# Patient Record
Sex: Male | Born: 1950 | Race: White | Hispanic: No | Smoking: Current every day smoker
Health system: Southern US, Community
[De-identification: ages and names within clinical notes are randomized; demographics above are authoritative.]

## PROBLEM LIST (undated history)

## (undated) ENCOUNTER — Emergency Department (HOSPITAL_COMMUNITY): Admission: EM | Payer: Medicare HMO

## (undated) DIAGNOSIS — I10 Essential (primary) hypertension: Secondary | ICD-10-CM

## (undated) DIAGNOSIS — F319 Bipolar disorder, unspecified: Secondary | ICD-10-CM

## (undated) DIAGNOSIS — Z720 Tobacco use: Secondary | ICD-10-CM

## (undated) DIAGNOSIS — I4891 Unspecified atrial fibrillation: Secondary | ICD-10-CM

## (undated) DIAGNOSIS — E785 Hyperlipidemia, unspecified: Secondary | ICD-10-CM

## (undated) DIAGNOSIS — F988 Other specified behavioral and emotional disorders with onset usually occurring in childhood and adolescence: Secondary | ICD-10-CM

## (undated) DIAGNOSIS — R7303 Prediabetes: Secondary | ICD-10-CM

## (undated) DIAGNOSIS — I714 Abdominal aortic aneurysm, without rupture, unspecified: Secondary | ICD-10-CM

## (undated) DIAGNOSIS — F209 Schizophrenia, unspecified: Secondary | ICD-10-CM

## (undated) DIAGNOSIS — J449 Chronic obstructive pulmonary disease, unspecified: Secondary | ICD-10-CM

## (undated) HISTORY — PX: OTHER SURGICAL HISTORY: SHX169

## (undated) HISTORY — DX: Tobacco use: Z72.0

## (undated) HISTORY — DX: Hyperlipidemia, unspecified: E78.5

## (undated) HISTORY — DX: Abdominal aortic aneurysm, without rupture, unspecified: I71.40

## (undated) HISTORY — DX: Bipolar disorder, unspecified: F31.9

## (undated) HISTORY — DX: Abdominal aortic aneurysm, without rupture: I71.4

## (undated) HISTORY — DX: Schizophrenia, unspecified: F20.9

## (undated) HISTORY — DX: Essential (primary) hypertension: I10

## (undated) HISTORY — PX: ABDOMINAL AORTIC ANEURYSM REPAIR: SUR1152

## (undated) HISTORY — PX: KNEE SURGERY: SHX244

## (undated) HISTORY — DX: Prediabetes: R73.03

## (undated) HISTORY — DX: Other specified behavioral and emotional disorders with onset usually occurring in childhood and adolescence: F98.8

## (undated) HISTORY — DX: Unspecified atrial fibrillation: I48.91

---

## 1998-04-30 ENCOUNTER — Emergency Department (HOSPITAL_COMMUNITY): Admission: EM | Admit: 1998-04-30 | Discharge: 1998-04-30 | Payer: Self-pay

## 1999-05-26 ENCOUNTER — Encounter: Admission: RE | Admit: 1999-05-26 | Discharge: 1999-05-31 | Payer: Self-pay | Admitting: *Deleted

## 2000-03-11 ENCOUNTER — Emergency Department (HOSPITAL_COMMUNITY): Admission: EM | Admit: 2000-03-11 | Discharge: 2000-03-11 | Payer: Self-pay | Admitting: Emergency Medicine

## 2000-04-10 ENCOUNTER — Encounter: Payer: Self-pay | Admitting: Emergency Medicine

## 2000-04-10 ENCOUNTER — Encounter: Admission: RE | Admit: 2000-04-10 | Discharge: 2000-04-10 | Payer: Self-pay | Admitting: Emergency Medicine

## 2006-01-25 ENCOUNTER — Ambulatory Visit: Payer: Self-pay | Admitting: Internal Medicine

## 2006-06-15 ENCOUNTER — Ambulatory Visit: Payer: Self-pay | Admitting: Internal Medicine

## 2006-06-15 DIAGNOSIS — E785 Hyperlipidemia, unspecified: Secondary | ICD-10-CM | POA: Insufficient documentation

## 2006-08-01 ENCOUNTER — Ambulatory Visit (HOSPITAL_BASED_OUTPATIENT_CLINIC_OR_DEPARTMENT_OTHER): Admission: RE | Admit: 2006-08-01 | Discharge: 2006-08-01 | Payer: Self-pay | Admitting: Internal Medicine

## 2006-08-01 ENCOUNTER — Encounter: Payer: Self-pay | Admitting: Internal Medicine

## 2006-08-23 ENCOUNTER — Ambulatory Visit: Payer: Self-pay | Admitting: Pulmonary Disease

## 2007-02-09 ENCOUNTER — Emergency Department (HOSPITAL_COMMUNITY): Admission: EM | Admit: 2007-02-09 | Discharge: 2007-02-10 | Payer: Self-pay | Admitting: Emergency Medicine

## 2007-02-27 ENCOUNTER — Ambulatory Visit: Payer: Self-pay | Admitting: Internal Medicine

## 2007-05-13 DIAGNOSIS — I10 Essential (primary) hypertension: Secondary | ICD-10-CM | POA: Insufficient documentation

## 2007-05-13 DIAGNOSIS — F988 Other specified behavioral and emotional disorders with onset usually occurring in childhood and adolescence: Secondary | ICD-10-CM

## 2007-05-13 HISTORY — DX: Other specified behavioral and emotional disorders with onset usually occurring in childhood and adolescence: F98.8

## 2008-04-06 ENCOUNTER — Telehealth: Payer: Self-pay | Admitting: Internal Medicine

## 2008-04-09 ENCOUNTER — Ambulatory Visit: Payer: Self-pay | Admitting: Internal Medicine

## 2008-04-14 ENCOUNTER — Ambulatory Visit: Payer: Self-pay | Admitting: Gastroenterology

## 2008-04-14 ENCOUNTER — Telehealth: Payer: Self-pay | Admitting: Internal Medicine

## 2008-04-22 ENCOUNTER — Telehealth: Payer: Self-pay | Admitting: Internal Medicine

## 2008-04-22 LAB — CONVERTED CEMR LAB
ALT: 21 units/L (ref 0–53)
AST: 19 units/L (ref 0–37)
Albumin: 4 g/dL (ref 3.5–5.2)
Alkaline Phosphatase: 61 units/L (ref 39–117)
BUN: 10 mg/dL (ref 6–23)
Bilirubin, Direct: 0.1 mg/dL (ref 0.0–0.3)
CO2: 31 meq/L (ref 19–32)
Calcium: 8.9 mg/dL (ref 8.4–10.5)
Chloride: 103 meq/L (ref 96–112)
Cholesterol: 210 mg/dL (ref 0–200)
Creatinine, Ser: 1.2 mg/dL (ref 0.4–1.5)
Direct LDL: 165.4 mg/dL
GFR calc Af Amer: 81 mL/min
GFR calc non Af Amer: 67 mL/min
Glucose, Bld: 93 mg/dL (ref 70–99)
HDL: 30.7 mg/dL — ABNORMAL LOW (ref >39.0)
Potassium: 4 meq/L (ref 3.5–5.1)
Sodium: 140 meq/L (ref 135–145)
Total Bilirubin: 1 mg/dL (ref 0.3–1.2)
Total CHOL/HDL Ratio: 6.8
Total Protein: 6.7 g/dL (ref 6.0–8.3)
Triglycerides: 114 mg/dL (ref 0–149)
VLDL: 23 mg/dL (ref 0–40)

## 2008-05-27 ENCOUNTER — Telehealth: Payer: Self-pay | Admitting: Internal Medicine

## 2008-07-14 ENCOUNTER — Ambulatory Visit: Payer: Self-pay | Admitting: Internal Medicine

## 2008-07-14 DIAGNOSIS — F17200 Nicotine dependence, unspecified, uncomplicated: Secondary | ICD-10-CM | POA: Insufficient documentation

## 2008-07-14 HISTORY — DX: Nicotine dependence, unspecified, uncomplicated: F17.200

## 2008-07-17 LAB — CONVERTED CEMR LAB
ALT: 21 units/L (ref 0–53)
AST: 19 units/L (ref 0–37)
Albumin: 4 g/dL (ref 3.5–5.2)
Alkaline Phosphatase: 71 units/L (ref 39–117)
BUN: 10 mg/dL (ref 6–23)
Bilirubin, Direct: 0.1 mg/dL (ref 0.0–0.3)
CO2: 34 meq/L — ABNORMAL HIGH (ref 19–32)
Calcium: 9.7 mg/dL (ref 8.4–10.5)
Chloride: 99 meq/L (ref 96–112)
Cholesterol: 170 mg/dL (ref 0–200)
Creatinine, Ser: 1 mg/dL (ref 0.4–1.5)
GFR calc Af Amer: 99 mL/min
GFR calc non Af Amer: 82 mL/min
Glucose, Bld: 90 mg/dL (ref 70–99)
HDL: 28 mg/dL — ABNORMAL LOW (ref >39.0)
LDL Cholesterol: 114 mg/dL — ABNORMAL HIGH (ref 0–99)
Potassium: 4.4 meq/L (ref 3.5–5.1)
Sodium: 142 meq/L (ref 135–145)
Total Bilirubin: 0.8 mg/dL (ref 0.3–1.2)
Total CHOL/HDL Ratio: 6.1
Total Protein: 7.2 g/dL (ref 6.0–8.3)
Triglycerides: 139 mg/dL (ref 0–149)
VLDL: 28 mg/dL (ref 0–40)

## 2008-07-20 ENCOUNTER — Telehealth: Payer: Self-pay | Admitting: Internal Medicine

## 2008-08-07 ENCOUNTER — Telehealth: Payer: Self-pay | Admitting: Internal Medicine

## 2008-08-11 ENCOUNTER — Ambulatory Visit: Payer: Self-pay | Admitting: Internal Medicine

## 2008-11-25 ENCOUNTER — Telehealth: Payer: Self-pay | Admitting: Internal Medicine

## 2009-03-09 ENCOUNTER — Ambulatory Visit: Payer: Self-pay | Admitting: Internal Medicine

## 2009-11-23 ENCOUNTER — Encounter: Payer: Self-pay | Admitting: Internal Medicine

## 2009-11-23 ENCOUNTER — Telehealth: Payer: Self-pay | Admitting: Internal Medicine

## 2009-11-26 ENCOUNTER — Encounter: Payer: Self-pay | Admitting: Internal Medicine

## 2010-01-14 ENCOUNTER — Telehealth: Payer: Self-pay | Admitting: Internal Medicine

## 2010-04-12 ENCOUNTER — Telehealth: Payer: Self-pay | Admitting: Internal Medicine

## 2010-06-07 ENCOUNTER — Ambulatory Visit: Payer: Self-pay | Admitting: Internal Medicine

## 2010-06-08 LAB — CONVERTED CEMR LAB
ALT: 26 units/L (ref 0–53)
AST: 22 units/L (ref 0–37)
Albumin: 4 g/dL (ref 3.5–5.2)
Alkaline Phosphatase: 64 units/L (ref 39–117)
BUN: 9 mg/dL (ref 6–23)
Bilirubin, Direct: 0.1 mg/dL (ref 0.0–0.3)
CO2: 29 meq/L (ref 19–32)
Calcium: 9 mg/dL (ref 8.4–10.5)
Chloride: 106 meq/L (ref 96–112)
Cholesterol: 246 mg/dL — ABNORMAL HIGH (ref 0–200)
Creatinine, Ser: 1.1 mg/dL (ref 0.4–1.5)
Direct LDL: 193.2 mg/dL
GFR calc non Af Amer: 76.17 mL/min (ref >60)
Glucose, Bld: 89 mg/dL (ref 70–99)
HDL: 39.2 mg/dL (ref >39.00)
Potassium: 4.3 meq/L (ref 3.5–5.1)
Sodium: 141 meq/L (ref 135–145)
TSH: 0.63 microintl units/mL (ref 0.35–5.50)
Total Bilirubin: 0.6 mg/dL (ref 0.3–1.2)
Total CHOL/HDL Ratio: 6
Total Protein: 7 g/dL (ref 6.0–8.3)
Triglycerides: 162 mg/dL — ABNORMAL HIGH (ref 0.0–149.0)
VLDL: 32.4 mg/dL (ref 0.0–40.0)

## 2010-07-22 ENCOUNTER — Ambulatory Visit: Payer: Self-pay | Admitting: Internal Medicine

## 2010-08-25 ENCOUNTER — Encounter (INDEPENDENT_AMBULATORY_CARE_PROVIDER_SITE_OTHER): Payer: Self-pay | Admitting: *Deleted

## 2010-09-15 ENCOUNTER — Telehealth: Payer: Self-pay | Admitting: Internal Medicine

## 2010-10-06 NOTE — Progress Notes (Signed)
Summary: request  Phone Note Refill Request Call back at [phone] Message from:  Patient  Refills Requested: Medication #1:  CONCERTA 36 MG TBCR Take 1 tablet by mouth once a day please call pt when ready for pick up  Initial call taken by: Heron Sabins,  April 12, 2010 9:42 AM  Follow-up for Phone Call        Last seen 03/09/09.  Told needs ov for Rx.  States does not have job or insurance so cannot afford to come in.   How to handle? Follow-up by: Gladis Riffle, RN,  April 12, 2010 2:45 PM

## 2010-10-06 NOTE — Progress Notes (Signed)
Summary: new rx  Phone Note From Pharmacy Call back at [facility] Phone [phone]   Summary of Call: pt is requesting a sample of concerta.  patient no longer has insurance and cannont afford the medication.  he was wondering if there is anything cheaper he could try. I did explain that we do not have samples of concerta Initial call taken by: Kern Reap CMA Duncan Dull),  November 23, 2009 2:03 PM  Follow-up for Phone Call        patient brough in paperwork for patient assistance to be filled out and faxed. Follow-up by: Kern Reap CMA Duncan Dull),  November 24, 2009 11:30 AM  Additional Follow-up for Phone Call Additional follow up Details #1::        faxed and scanned to chart Additional Follow-up by: Kern Reap CMA Duncan Dull),  November 26, 2009 2:03 PM

## 2010-10-06 NOTE — Medication Information (Signed)
Summary: Patient Assistance Program for Concerta  Patient Assistance Program for Concerta   Imported By: Maryln Gottron 12/07/2009 10:31:43  _____________________________________________________________________  External Attachment:    Type:   Image     Comment:   External Document

## 2010-10-06 NOTE — Assessment & Plan Note (Signed)
Summary: MED CK / REFILL // RS   Vital Signs:  Patient profile:   60 year old male Weight:      236 pounds Temp:     98.6 degrees F oral Pulse rate:   72 / minute Pulse rhythm:   regular Resp:     12 per minute BP sitting:   180 / 110  (left arm) Cuff size:   large  Vitals Entered By: Sid Falcon LPN (June 07, 2010 9:43 AM)  History of Present Illness:  Follow-Up Visit      This is a 60 year old man who presents for Follow-up visit.  The patient denies chest pain and palpitations.  Since the last visit the patient notes no new problems or concerns except has right elbow pain---increase with movement---pain can extend to right shoulder. Feels like it is "sunburned".  The patient reports not taking meds as prescribed.  When questioned about possible medication side effects, the patient notes none.    All other systems reviewed and were negative   Current Problems (verified): 1)  Tobacco Use  (ICD-305.1) 2)  Add  (ICD-314.00) 3)  Hypertension  (ICD-401.9) 4)  Hyperlipidemia  (ICD-272.4)  Allergies: 1)  Codeine Phosphate (Codeine Phosphate)  Past History:  Past Medical History: Last updated: 04/09/2008 ADD Hyperlipidemia Hypertension Tobacco Abuse  Past Surgical History: Last updated: 04/09/2008  knee surgery, bilateral  open  Family History: Last updated: 04/09/2008 Father: A & W Mother: A & W Siblings: 1 brother HTN                1 sister with ? health Family History Hypertension  Social History: Last updated: 04/09/2008 Occupation:sell plumbing supplies Divorced Current Smoker 1 healthy child age 89 on 01/25/06  Risk Factors: Smoking Status: current (04/09/2008) Packs/Day: >1 (05/13/2007) Passive Smoke Exposure: yes (07/14/2008)  Physical Exam  General:  alert and well-developed.   Head:  normocephalic and atraumatic.   Eyes:  pupils equal and pupils round.   Ears:  R ear normal and L ear normal.   Neck:  No deformities, masses, or tenderness  noted. Chest Wall:  No deformities, masses, tenderness or gynecomastia noted. Heart:  normal rate and regular rhythm.   Abdomen:  soft and non-tender.   Skin:  turgor normal and color normal.   Psych:  good eye contact.     Impression & Recommendations:  Problem # 1:  TOBACCO USE (ICD-305.1) advise complete tobacco absence. Patient is not interested in quitting.  Problem # 2:  ADD (ICD-314.00) discussed medications. Will resume previous medications. Side effects discussed.  Problem # 3:  HYPERTENSION (ICD-401.9)  pt has not been compliant with meds resume meds he will monitor blood pressure. He will call me if blood pressure is over 140/90. His updated medication list for this problem includes:    Lisinopril-hydrochlorothiazide 20-25 Mg Tabs (Lisinopril-hydrochlorothiazide) .Marland Kitchen... Take 1 tablet by mouth once a day  BP today: 180/110 Prior BP: 132/86 (03/09/2009)  Labs Reviewed: K+: 4.4 (07/14/2008) Creat: : 1.0 (07/14/2008)   Chol: 170 (07/14/2008)   HDL: 28.0 (07/14/2008)   LDL: 114 (07/14/2008)   TG: 139 (07/14/2008)  Orders: Venipuncture (44034) Specimen Handling (74259) TLB-BMP (Basic Metabolic Panel-BMET) (80048-METABOL)  Problem # 4:  HYPERLIPIDEMIA (ICD-272.4)  has been noncompliant  advised to resume meds check labs today His updated medication list for this problem includes:    Simvastatin 20 Mg Tabs (Simvastatin) ..... One daily  Orders: Specimen Handling (56387) TLB-Lipid Panel (80061-LIPID) TLB-Hepatic/Liver Function Pnl (  80076-HEPATIC) TLB-TSH (Thyroid Stimulating Hormone) (84443-TSH)  Problem # 5:  LATERAL EPICONDYLITIS OF ELBOW (ICD-726.32) ice to area consider NSAID  Complete Medication List: 1)  Concerta 36 Mg Tbcr (Methylphenidate hcl) .... Take 1 tablet by mouth once a day 2)  Lisinopril-hydrochlorothiazide 20-25 Mg Tabs (Lisinopril-hydrochlorothiazide) .... Take 1 tablet by mouth once a day 3)  Simvastatin 20 Mg Tabs (Simvastatin) .... One  daily 4)  Concerta 36 Mg Cr-tabs (Methylphenidate hcl) .... Take 1 tablet by mouth once a day   fill in one month 5)  Concerta 36 Mg Cr-tabs (Methylphenidate hcl) .... Take 1 tablet by mouth once a day   fill in two months  Other Orders: Admin 1st Vaccine (04540) Flu Vaccine 23yrs + (98119) Gastroenterology Referral (GI) Prescriptions: CONCERTA 36 MG CR-TABS (METHYLPHENIDATE HCL) Take 1 tablet by mouth once a day   FILL IN TWO MONTHS  #30 x 0   Entered and Authorized by:   Birdie Sons MD   Signed by:   Birdie Sons MD on 06/07/2010   Method used:   Print then Give to Patient   RxID:   [id number] CONCERTA 36 MG CR-TABS (METHYLPHENIDATE HCL) Take 1 tablet by mouth once a day   FILL IN ONE MONTH  #30 x 0   Entered and Authorized by:   Birdie Sons MD   Signed by:   Birdie Sons MD on 06/07/2010   Method used:   Print then Give to Patient   RxID:   [id number] SIMVASTATIN 20 MG  TABS (SIMVASTATIN) one daily  #90 x 3   Entered and Authorized by:   Birdie Sons MD   Signed by:   Birdie Sons MD on 06/07/2010   Method used:   Print then Give to Patient   RxID:   [id number] LISINOPRIL-HYDROCHLOROTHIAZIDE 20-25 MG  TABS (LISINOPRIL-HYDROCHLOROTHIAZIDE) Take 1 tablet by mouth once a day  #90 x 3   Entered and Authorized by:   Birdie Sons MD   Signed by:   Birdie Sons MD on 06/07/2010   Method used:   Print then Give to Patient   RxID:   [id number] CONCERTA 36 MG TBCR (METHYLPHENIDATE HCL) Take 1 tablet by mouth once a day  #30 x 0   Entered and Authorized by:   Birdie Sons MD   Signed by:   Birdie Sons MD on 06/07/2010   Method used:   Print then Give to Patient   RxID:   [id number]    Flu Vaccine Consent Questions     Do you have a history of severe allergic reactions to this vaccine? no    Any prior history of allergic reactions to egg and/or gelatin? no    Do you have a sensitivity to the preservative Thimersol? no    Do you have a past history of  Guillan-Barre Syndrome? no    Do you currently have an acute febrile illness? no    Have you ever had a severe reaction to latex? no    Vaccine information given and explained to patient? yes    Are you currently pregnant? no    Lot Number:[id number]   Exp Date:03/04/2011   Site Given  Left Deltoid IMbflu

## 2010-10-06 NOTE — Progress Notes (Signed)
Summary: Pt req refills of Concerta,Lisinopril and Simvastatin  Phone Note Refill Request Call back at [phone] cell   Refills Requested: Medication #1:  CONCERTA 36 MG TBCR Take 1 tablet by mouth once a day   Dosage confirmed as above?Dosage Confirmed   Supply Requested: 3 months  Medication #2:  LISINOPRIL-HYDROCHLOROTHIAZIDE 20-25 MG  TABS Take 1 tablet by mouth once a day  Medication #3:  SIMVASTATIN 20 MG  TABS one daily   Supply Requested: 3 months   Supply Requested: 3 months  Method Requested: Telephone to [facility] Initial call taken by: Lucy Antigua,  September 15, 2010 1:12 PM  Follow-up for Phone Call        concerta rx up front ready for p/u, other 2 sent in to pharmacy electronically, pt aware Follow-up by: Alfred Levins, CMA,  September 16, 2010 8:47 AM    Prescriptions: CONCERTA 36 MG TBCR (METHYLPHENIDATE HCL) Take 1 tablet by mouth once a day  #30 x 0   Entered by:   Alfred Levins, CMA   Authorized by:   Birdie Sons MD   Signed by:   Alfred Levins, CMA on 09/15/2010   Method used:   Print then Give to Patient   RxID:   [id number] SIMVASTATIN 20 MG  TABS (SIMVASTATIN) one daily  #90 x 3   Entered by:   Alfred Levins, CMA   Authorized by:   Birdie Sons MD   Signed by:   Alfred Levins, CMA on 09/15/2010   Method used:   Electronically to        [facility]  [phone]* (retail)       [address]       [place], [place]  [address]       Ph: [phone] or [phone]       Fax: [phone]   RxID:   [id number] LISINOPRIL-HYDROCHLOROTHIAZIDE 20-25 MG  TABS (LISINOPRIL-HYDROCHLOROTHIAZIDE) Take 1 tablet by mouth once a day  #90 x 3   Entered by:   Alfred Levins, CMA   Authorized by:   Birdie Sons MD   Signed by:   Alfred Levins, CMA on 09/15/2010   Method used:   Electronically to        [facility]  [phone]* (retail)       [address]       [place],  [place]  [address]       Ph: [phone] or [phone]       Fax: [phone]   RxID:   [id number]

## 2010-10-06 NOTE — Letter (Signed)
Summary: External Correspondence  External Correspondence   Imported By: Georgian Co 11/26/2009 11:49:53  _____________________________________________________________________  External Attachment:    Type:   Image     Comment:   External Document

## 2010-10-06 NOTE — Progress Notes (Signed)
Summary: question--speak with nurse  Phone Note Call from Patient Call back at [phone]   Caller: Patient  voice mail Call For: Birdie Sons MD Summary of Call: would like to speak with nurse about concerta. Initial call taken by: Gladis Riffle, RN,  Jan 14, 2010 2:57 PM  Follow-up for Phone Call        Left message on machine. Pt to call back.  Follow-up by: Gladis Riffle, RN,  Jan 14, 2010 2:57 PM  Additional Follow-up for Phone Call Additional follow up Details #1::        Left message on machine. Pt to call back. Work number is no longer current.Gladis Riffle, RN  Jan 17, 2010 1:46 PM  Left message on machine. Pt to call back. Gladis Riffle, RN  Jan 18, 2010 7:52 AM     Additional Follow-up for Phone Call Additional follow up Details #2::    PHARMACY WOULD ONLY ACCEPT 90 DAY FOR ONE MONTH.  nEEDS rX FOR THIS MONTH AND NEXT MONTH.  sEE rX.  pT AWARE READY FOR PICK UP. Follow-up by: Gladis Riffle, RN,  Jan 18, 2010 10:55 AM  New/Updated Medications: CONCERTA 36 MG CR-TABS (METHYLPHENIDATE HCL) Take 1 tablet by mouth once a day   FILL IN ONE MONTH CONCERTA 36 MG CR-TABS (METHYLPHENIDATE HCL) Take 1 tablet by mouth once a day   FILL IN TWO MONTHS Prescriptions: CONCERTA 36 MG CR-TABS (METHYLPHENIDATE HCL) Take 1 tablet by mouth once a day   FILL IN ONE MONTH  #30 x 0   Entered by:   Gladis Riffle, RN   Authorized by:   Birdie Sons MD   Signed by:   Gladis Riffle, RN on 01/18/2010   Method used:   Print then Give to Patient   RxID:   [id number] CONCERTA 36 MG TBCR (METHYLPHENIDATE HCL) Take 1 tablet by mouth once a day  #30 x 0   Entered by:   Gladis Riffle, RN   Authorized by:   Birdie Sons MD   Signed by:   Gladis Riffle, RN on 01/18/2010   Method used:   Print then Give to Patient   RxID:   [id number]

## 2010-10-06 NOTE — Letter (Signed)
Summary: Referral - not able to see patient  [facility]  [address] [place], [place] [address]   Phone: [phone]  Fax: [phone]    August 25, 2010   Birdie Sons, M.D. [address] [place], [place] [address]   Re:   Steven Hayden DOB:  01-May-1951 MRN:   [id number]    Dear Dr. Cato Mulligan:  Thank you for your kind referral of the above patient.  We have attempted to schedule the recommended procedure Screening Colonoscopy but have not been able to schedule because:   X  The patient was not available by phone and/or has not returned our calls.  ___ The patient declined to schedule the procedure at this time.  We appreciate the referral and hope that we will have the opportunity to treat this patient in the future.    Sincerely,    [facility] [phone]

## 2010-10-27 ENCOUNTER — Encounter: Payer: Self-pay | Admitting: Internal Medicine

## 2010-10-28 ENCOUNTER — Encounter: Payer: Self-pay | Admitting: Internal Medicine

## 2010-10-28 ENCOUNTER — Ambulatory Visit (INDEPENDENT_AMBULATORY_CARE_PROVIDER_SITE_OTHER): Payer: 59 | Admitting: Internal Medicine

## 2010-10-28 VITALS — BP 112/74 | HR 74 | Temp 98.2°F | Ht 71.0 in | Wt 244.0 lb

## 2010-10-28 DIAGNOSIS — F988 Other specified behavioral and emotional disorders with onset usually occurring in childhood and adolescence: Secondary | ICD-10-CM

## 2010-10-28 DIAGNOSIS — E785 Hyperlipidemia, unspecified: Secondary | ICD-10-CM

## 2010-10-28 DIAGNOSIS — I10 Essential (primary) hypertension: Secondary | ICD-10-CM

## 2010-10-28 DIAGNOSIS — F17200 Nicotine dependence, unspecified, uncomplicated: Secondary | ICD-10-CM

## 2010-10-28 MED ORDER — LISINOPRIL-HYDROCHLOROTHIAZIDE 20-25 MG PO TABS: 1.0000 | ORAL_TABLET | Freq: Every day | ORAL | Status: DC

## 2010-10-28 MED ORDER — METHYLPHENIDATE HCL ER (OSM) 36 MG PO TBCR: 36.0000 mg | EXTENDED_RELEASE_TABLET | Freq: Every day | ORAL | Status: DC

## 2010-10-28 MED ORDER — SIMVASTATIN 20 MG PO TABS: 20.0000 mg | ORAL_TABLET | Freq: Every day | ORAL | Status: DC

## 2010-10-28 MED ORDER — CITALOPRAM HYDROBROMIDE 20 MG PO TABS: 20.0000 mg | ORAL_TABLET | Freq: Every day | ORAL | Status: DC

## 2010-10-28 NOTE — Progress Notes (Signed)
  Subjective:    Patient ID: Steven Hayden, male    DOB: March 24, 1951, 60 y.o.   MRN: [id number]  HPI Quit smoking because he had several episodes of nocturnal nausea and PND 2 months ago---feels much better. Has started to exercise (a little). His dyspnea is much better  ADHD---concerta---he feels better on it but not as focused as he would like. He has real trouble completing tasks---doesn't evn\en pay attetnion to   htn---tolerating meds  Tobacco abuse---has quit for two months   Past Medical History  Diagnosis Date  . Hyperlipidemia   . Hypertension   . ADD (attention deficit disorder)   . Tobacco abuse    Past Surgical History  Procedure Date  . Knee surgery     bilateral    reports that he quit smoking about 2 months ago. His smoking use included Cigarettes. He does not have any smokeless tobacco history on file. His alcohol and drug histories not on file. family history includes Hypertension in his brother and father.     Allergies  Allergen Reactions  . Codeine Phosphate     REACTION: swelling of uvula, hives     Review of Systems  patient denies chest pain, shortness of breath, orthopnea. Denies lower extremity edema, abdominal pain, change in appetite, change in bowel movements. Patient denies rashes, musculoskeletal complaints. No other specific complaints in a complete review of systems.      Objective:   Physical Exam  well-developed well-nourished male in no acute distress. HEENT exam atraumatic, normocephalic, neck supple without jugular venous distention. Chest clear to auscultation cardiac exam S1-S2 are regular. Abdominal exam t with bowel sounds, soft and nontender. Extremities no edema. Neurologic exam is alert with a normal gait.        Assessment & Plan:

## 2010-10-28 NOTE — Assessment & Plan Note (Signed)
congratulated on smoking cessation. Continue same.

## 2010-10-28 NOTE — Assessment & Plan Note (Signed)
Fair control. Continue current medications. 

## 2010-10-28 NOTE — Assessment & Plan Note (Signed)
Patient has been noncompliant with medications. C. Previous labs. I discussed the risk of untreated hyperlipidemia.

## 2010-10-28 NOTE — Assessment & Plan Note (Signed)
I refilled his medication. Side effects discussed. I also think he has an generalized anxiety disorder. Will try [place] from. Side effects discussed. Followup with me in one month.

## 2010-10-31 ENCOUNTER — Other Ambulatory Visit: Payer: Self-pay | Admitting: Internal Medicine

## 2010-10-31 DIAGNOSIS — F909 Attention-deficit hyperactivity disorder, unspecified type: Secondary | ICD-10-CM

## 2010-10-31 MED ORDER — METHYLPHENIDATE HCL ER (OSM) 36 MG PO TBCR: 36.0000 mg | EXTENDED_RELEASE_TABLET | Freq: Every day | ORAL | Status: DC

## 2011-01-19 ENCOUNTER — Ambulatory Visit: Payer: 59 | Admitting: Internal Medicine

## 2011-01-20 NOTE — Procedures (Signed)
NAME:  Steven Hayden, Steven Hayden               ACCOUNT NO.:  [id number]   MEDICAL RECORD NO.:  [id number]          PATIENT TYPE:  OUT   LOCATION:  SLEEP CENTER                 FACILITY[facility]  [facility]   PHYSICIAN:  Barbaraann Share, MD,FCCPDATE OF BIRTH:  02-02-52   DATE OF STUDY:  08/01/2006                            NOCTURNAL POLYSOMNOGRAM   LOCATION:  Sleep lab.   REFERRING PHYSICIAN:  Valetta Mole. Swords, MD   INDICATION FOR STUDY:  Hypersomnia with sleep apnea.   EPWORTH SLEEPINESS SCORE:  16.   SLEEP ARCHITECTURE:  The patient had a total sleep time of 234 minutes  with decreased REM and never achieved slow wave sleep.  Sleep onset  latency was increased at 37 minutes, and REM onset was fairly rapid at  48 minutes.  Sleep efficiency was very decreased at 65%.   RESPIRATORY DATA:  The patient underwent split night protocol where he  is found to have 43 obstructive events in the first 164 minutes of  sleep.  This gave him a Respiratory Disturbance Index of 16 events per  hour over the first half of the night.  The events were not positional,  but there was moderate snoring noted throughout.  By protocol, the  patient was then placed on a medium ResMed Ultra Mirage full face mask  and CPAP titration was initiated.  The patient complained of severe  nasal congestion and then was complaining of tolerance with the CPAP.  Patient was then changed to BiPAP with an inspiratory pressure of 7 and  an expiratory pressure of 4 but continued to have difficulties with  tolerance.  Therapeutic CPAP titration was not able to be obtained in  the short amount of time left during the night.   OXYGEN DATA:  Patient had O2 desaturation as low as 86% with his  obstructive events.   CARDIAC DATA:  The patient had throughout the night changes in his QRS  axis but continued to have a regular rhythm.  There were a few episodes  of what appeared to be accelerated idioventricular rhythms that were not  associated with  obstructive events.   MOVEMENT-PARASOMNIA:  The patient was found to have 28 leg jerks with 18  resulting in arousal or awakening.  This gave him an index of 5 per  hour.   IMPRESSIONS-RECOMMENDATIONS:  1. Split night study reveals mild obstructive sleep apnea/hypopnea      syndrome during the first half of the night with the Respiratory      Disturbance Index of 16 events per hour and O2 desaturation as low      as 86%.  The patient was then placed on CPAP with a medium ResMed      Ultra Mirage full face mask but had difficulties with pressure      tolerance, even on BiPAP.  Therapeutic CPAP titration was not able      to be determined in the short time allotted for titration.  The      patient would benefit from desensitization at home to the CPAP mask      and also consideration of a short term use of a sedative/hypnotic  to help with CPAP tolerance.  I would either recommend starting      CPAP at 8 cm of water pressure and having the patient return for      formal titration versus placing him on an auto set machine for      approximately 3-4 weeks to help with tolerance and also to find his      optimal pressure.  He could then be started on a regular CPAP once      he became more adjusted to a positive pressure device at the      pressure obtained from a download on a CPAP.  2. Occasional episodes of accelerated idioventricular rhythm noted on      the sleep study, not associated with obstructive events.  Clinical      correlation is suggested.  3. Small numbers of leg jerks with mild sleep disruption.  I suspect      the leg jerks are related to his sleep disordered breathing and not      a primary movement disorder of sleep.      Barbaraann Share, MD,FCCP  Diplomate, American Board of Sleep  Medicine     [facility]/MEDQ  D:  08/17/2006 15:28:49  T:  08/17/2006 21:33:13  Job:  [id number]

## 2011-01-31 ENCOUNTER — Ambulatory Visit (INDEPENDENT_AMBULATORY_CARE_PROVIDER_SITE_OTHER): Payer: Self-pay | Admitting: Internal Medicine

## 2011-01-31 ENCOUNTER — Encounter: Payer: Self-pay | Admitting: Internal Medicine

## 2011-01-31 DIAGNOSIS — F988 Other specified behavioral and emotional disorders with onset usually occurring in childhood and adolescence: Secondary | ICD-10-CM

## 2011-01-31 NOTE — Progress Notes (Signed)
  Subjective:    Patient ID: Steven Hayden, male    DOB: Feb 09, 1951, 60 y.o.   MRN: [id number]  HPI  Pt has had trouble with maintaining employment. Comes in with friend to discuss his mental capabilities. He almost certainly has ADHD. Has lost job, can't afford concerta or other ADHD.  Review of Systems     Objective:   Physical Exam        Assessment & Plan:

## 2011-01-31 NOTE — Assessment & Plan Note (Signed)
He has significan ADHD. I'm not sure what else I can do for him. He needs to call [employer] and start process for disability I suspect he is not employable. Total time 10 minutes with patient and his friend

## 2011-02-01 ENCOUNTER — Other Ambulatory Visit: Payer: Self-pay | Admitting: Internal Medicine

## 2011-03-07 ENCOUNTER — Telehealth: Payer: Self-pay | Admitting: Internal Medicine

## 2011-03-07 DIAGNOSIS — F909 Attention-deficit hyperactivity disorder, unspecified type: Secondary | ICD-10-CM

## 2011-03-07 MED ORDER — METHYLPHENIDATE HCL ER (OSM) 36 MG PO TBCR: 36.0000 mg | EXTENDED_RELEASE_TABLET | Freq: Every day | ORAL | Status: DC

## 2011-03-07 NOTE — Telephone Encounter (Signed)
rx will be ready on 03/09/11, pt aware

## 2011-03-07 NOTE — Telephone Encounter (Signed)
Refill Concerta x 3 rf.

## 2011-03-07 NOTE — Telephone Encounter (Signed)
Addended by: Alfred Levins D on: 03/07/2011 02:38 PM   Modules accepted: Orders

## 2011-06-26 ENCOUNTER — Other Ambulatory Visit: Payer: Self-pay | Admitting: Internal Medicine

## 2011-06-26 DIAGNOSIS — F909 Attention-deficit hyperactivity disorder, unspecified type: Secondary | ICD-10-CM

## 2011-06-26 NOTE — Telephone Encounter (Signed)
Pt also need concerta 36 mg. Please call when ready for pick up

## 2011-06-27 MED ORDER — METHYLPHENIDATE HCL ER (OSM) 36 MG PO TBCR: 36.0000 mg | EXTENDED_RELEASE_TABLET | Freq: Every day | ORAL | Status: DC

## 2011-06-27 NOTE — Telephone Encounter (Signed)
rx up front ready for p/u, pt aware 

## 2011-10-09 ENCOUNTER — Telehealth: Payer: Self-pay | Admitting: Internal Medicine

## 2011-10-09 DIAGNOSIS — F909 Attention-deficit hyperactivity disorder, unspecified type: Secondary | ICD-10-CM

## 2011-10-09 MED ORDER — LISINOPRIL-HYDROCHLOROTHIAZIDE 20-25 MG PO TABS: 1.0000 | ORAL_TABLET | Freq: Every day | ORAL | Status: DC

## 2011-10-09 MED ORDER — METHYLPHENIDATE HCL ER (OSM) 36 MG PO TBCR: 36.0000 mg | EXTENDED_RELEASE_TABLET | Freq: Every day | ORAL | Status: DC

## 2011-10-09 NOTE — Telephone Encounter (Signed)
rx up front ready for p/u, pt aware 

## 2011-10-09 NOTE — Telephone Encounter (Signed)
Refill Concerta and Lisinopril. He will pick up both rxs when ready. Thanks.

## 2011-11-22 ENCOUNTER — Other Ambulatory Visit: Payer: Self-pay | Admitting: Internal Medicine

## 2011-11-22 DIAGNOSIS — F909 Attention-deficit hyperactivity disorder, unspecified type: Secondary | ICD-10-CM

## 2011-11-22 MED ORDER — METHYLPHENIDATE HCL ER (OSM) 36 MG PO TBCR: 36.0000 mg | EXTENDED_RELEASE_TABLET | Freq: Every day | ORAL | Status: DC

## 2011-11-22 NOTE — Telephone Encounter (Signed)
Ok x1 pt needs OV.  Ok per Dr Cato Mulligan, rx up front ready for p/u, pt aware he will make appt when he picks up rx

## 2011-11-22 NOTE — Telephone Encounter (Signed)
Pt need lisinopril call into [facility] [address] also new rx concerta 36 mg, pt is requesting 3 separate rx for 3 months. Please call pt when ready for pick up.

## 2011-12-12 ENCOUNTER — Ambulatory Visit (INDEPENDENT_AMBULATORY_CARE_PROVIDER_SITE_OTHER): Payer: Self-pay | Admitting: Internal Medicine

## 2011-12-12 ENCOUNTER — Encounter: Payer: Self-pay | Admitting: Internal Medicine

## 2011-12-12 VITALS — BP 135/85 | HR 88 | Temp 98.0°F | Ht 71.0 in | Wt 240.5 lb

## 2011-12-12 DIAGNOSIS — F17200 Nicotine dependence, unspecified, uncomplicated: Secondary | ICD-10-CM

## 2011-12-12 DIAGNOSIS — I10 Essential (primary) hypertension: Secondary | ICD-10-CM

## 2011-12-12 DIAGNOSIS — E785 Hyperlipidemia, unspecified: Secondary | ICD-10-CM

## 2011-12-12 DIAGNOSIS — F909 Attention-deficit hyperactivity disorder, unspecified type: Secondary | ICD-10-CM

## 2011-12-12 DIAGNOSIS — F988 Other specified behavioral and emotional disorders with onset usually occurring in childhood and adolescence: Secondary | ICD-10-CM

## 2011-12-12 LAB — HEPATIC FUNCTION PANEL
ALT: 24 U/L (ref 0–53)
Total Bilirubin: 0.3 mg/dL (ref 0.3–1.2)
Total Protein: 7.4 g/dL (ref 6.0–8.3)

## 2011-12-12 LAB — BASIC METABOLIC PANEL
BUN: 14 mg/dL (ref 6–23)
CO2: 32 mEq/L (ref 19–32)
Chloride: 100 mEq/L (ref 96–112)
Creatinine, Ser: 0.9 mg/dL (ref 0.4–1.5)

## 2011-12-12 LAB — LIPID PANEL: Cholesterol: 236 mg/dL — ABNORMAL HIGH (ref 0–200)

## 2011-12-12 MED ORDER — METHYLPHENIDATE HCL ER (OSM) 36 MG PO TBCR: 36.0000 mg | EXTENDED_RELEASE_TABLET | Freq: Every day | ORAL | Status: DC

## 2011-12-12 MED ORDER — LISINOPRIL-HYDROCHLOROTHIAZIDE 20-25 MG PO TABS: 1.0000 | ORAL_TABLET | Freq: Every day | ORAL | Status: DC

## 2011-12-12 MED ORDER — SIMVASTATIN 20 MG PO TABS: 20.0000 mg | ORAL_TABLET | Freq: Every day | ORAL | Status: DC

## 2011-12-12 NOTE — Assessment & Plan Note (Signed)
Tolerating meds Continue same, check labs today

## 2011-12-12 NOTE — Progress Notes (Signed)
Patient ID: Steven Hayden, male   DOB: [birth year], 61 y.o.   MRN: [id number] Smoker, not interested in quitting.  Lipids--needs f/u  htn--no home bps, he states he is compliant with all meds  Past Medical History  Diagnosis Date  . Hyperlipidemia   . Hypertension   . ADD (attention deficit disorder)   . Tobacco abuse     History   Social History  . Marital Status: Divorced    Spouse Name: N/A    Number of Children: 1  . Years of Education: N/A   Occupational History  . Not on file.   Social History Main Topics  . Smoking status: Current Everyday Smoker -- 1.0 packs/day    Types: Cigarettes  . Smokeless tobacco: Not on file  . Alcohol Use: Yes  . Drug Use: No  . Sexually Active: Not on file   Other Topics Concern  . Not on file   Social History Narrative  . No narrative on file    Past Surgical History  Procedure Date  . Knee surgery     bilateral    Family History  Problem Relation Age of Onset  . Hypertension Brother   . Hypertension Father     Allergies  Allergen Reactions  . Codeine Phosphate     REACTION: swelling of uvula, hives    Current Outpatient Prescriptions on File Prior to Visit  Medication Sig Dispense Refill  . DISCONTD: lisinopril-hydrochlorothiazide (PRINZIDE,ZESTORETIC) 20-25 MG per tablet Take 1 tablet by mouth daily.  90 tablet  0  . DISCONTD: methylphenidate (CONCERTA) 36 MG CR tablet Take 1 tablet (36 mg total) by mouth daily. Needs OV  30 tablet  0  . DISCONTD: simvastatin (ZOCOR) 20 MG tablet Take 1 tablet (20 mg total) by mouth daily.  90 tablet  3     patient denies chest pain, shortness of breath, orthopnea. Denies lower extremity edema, abdominal pain, change in appetite, change in bowel movements. Patient denies rashes, musculoskeletal complaints. No other specific complaints in a complete review of systems.   BP 135/85  Pulse 88  Temp(Src) 98 F (36.7 C) (Oral)  Ht 5\' 11"  (1.803 m)  Wt 240 lb 8 oz (109.09 kg)  BMI  33.54 kg/m2  well-developed well-nourished male in no acute distress. HEENT exam atraumatic, normocephalic, neck supple without jugular venous distention. Chest clear to auscultation cardiac exam S1-S2 are regular. Abdominal exam overweight with bowel sounds, soft and nontender. Extremities no edema. Neurologic exam is alert with a normal gait.

## 2011-12-12 NOTE — Assessment & Plan Note (Signed)
Repeat bp is better He understands need to lose weight

## 2011-12-12 NOTE — Progress Notes (Signed)
Addended by: Alfred Levins D on: 12/12/2011 09:17 AM   Modules accepted: Orders

## 2011-12-12 NOTE — Assessment & Plan Note (Signed)
Refill meds

## 2011-12-12 NOTE — Assessment & Plan Note (Signed)
Not interested in quitting 

## 2011-12-15 ENCOUNTER — Other Ambulatory Visit: Payer: Self-pay | Admitting: *Deleted

## 2011-12-15 MED ORDER — ATORVASTATIN CALCIUM 40 MG PO TABS: 40.0000 mg | ORAL_TABLET | Freq: Every day | ORAL | Status: DC

## 2012-02-26 DIAGNOSIS — Z0279 Encounter for issue of other medical certificate: Secondary | ICD-10-CM

## 2012-05-22 ENCOUNTER — Telehealth: Payer: Self-pay | Admitting: Internal Medicine

## 2012-05-22 NOTE — Telephone Encounter (Signed)
Is ok to increase the lisinopril

## 2012-05-22 NOTE — Telephone Encounter (Signed)
Pt called req script for methylphenidate (CONCERTA) 36 MG CR tablet.   Also pt called re: the  lisinopril-hydrochlorothiazide (PRINZIDE,ZESTORETIC) 20-25 MG per tablet. Pt said that the orig script was for 1 per day, but pt said that his BP was still high, so pt started taking 2 per day and his bp is doing great. Pt is req that script be changed to 2 per day and called in to [facility] [address].

## 2012-05-24 MED ORDER — METHYLPHENIDATE HCL ER (OSM) 36 MG PO TBCR: 36.0000 mg | EXTENDED_RELEASE_TABLET | Freq: Every day | ORAL | Status: DC

## 2012-05-24 MED ORDER — LISINOPRIL-HYDROCHLOROTHIAZIDE 20-25 MG PO TABS: 2.0000 | ORAL_TABLET | Freq: Every day | ORAL | Status: DC

## 2012-05-24 NOTE — Telephone Encounter (Signed)
Ok to both requests- change in medication list

## 2012-05-24 NOTE — Telephone Encounter (Signed)
[facility]ril [facility], l/m on pts cell phone to p/u concerta rx's on Monday

## 2012-09-06 ENCOUNTER — Telehealth: Payer: Self-pay | Admitting: Internal Medicine

## 2012-09-06 MED ORDER — METHYLPHENIDATE HCL ER (OSM) 36 MG PO TBCR: 36.0000 mg | EXTENDED_RELEASE_TABLET | Freq: Every day | ORAL | Status: DC

## 2012-09-06 NOTE — Telephone Encounter (Signed)
Pt needs 3 month Rx for Concerta. He needs 1 written Rx for each month for a total of 3 written Rx's.

## 2012-09-06 NOTE — Telephone Encounter (Signed)
Ok per Dr Cato Mulligan, rx's will be ready for p/u on Monday 09/09/12, pt aware

## 2012-10-07 ENCOUNTER — Ambulatory Visit: Payer: Self-pay | Admitting: Internal Medicine

## 2013-01-22 ENCOUNTER — Ambulatory Visit: Payer: Self-pay | Admitting: Internal Medicine

## 2013-02-18 ENCOUNTER — Ambulatory Visit (INDEPENDENT_AMBULATORY_CARE_PROVIDER_SITE_OTHER): Payer: Self-pay | Admitting: Family Medicine

## 2013-02-18 VITALS — BP 102/84 | Temp 98.6°F | Wt 234.0 lb

## 2013-02-18 DIAGNOSIS — J441 Chronic obstructive pulmonary disease with (acute) exacerbation: Secondary | ICD-10-CM

## 2013-02-18 DIAGNOSIS — F988 Other specified behavioral and emotional disorders with onset usually occurring in childhood and adolescence: Secondary | ICD-10-CM

## 2013-02-18 MED ORDER — METHYLPHENIDATE HCL ER (OSM) 36 MG PO TBCR: 36.0000 mg | EXTENDED_RELEASE_TABLET | ORAL | Status: DC

## 2013-02-18 MED ORDER — PREDNISONE 10 MG PO TABS: ORAL_TABLET | ORAL | Status: DC

## 2013-02-18 MED ORDER — METHYLPHENIDATE HCL ER (OSM) 36 MG PO TBCR: 36.0000 mg | EXTENDED_RELEASE_TABLET | Freq: Every day | ORAL | Status: DC

## 2013-02-18 NOTE — Progress Notes (Signed)
  Subjective:    Patient ID: Steven Hayden, male    DOB: July 12, 1951, 62 y.o.   MRN: [id number]  HPI 62 year old 45-pack-year history smoker who is seen with some chronic dyspnea worse over the past week. Cough which is mostly nonproductive. Denies any chest pain. Symptoms are constant. Has used guaifenesin without much improvement. Denies any recent fevers or chills. No hemoptysis. Denies recent appetite or weight changes.  Previously has been given inhalers of some type which have helped. He has not formally been diagnosed with COPD but he strongly suspects he has this. He is aware of some intermittent wheezing which is been somewhat chronic.  History of ADD and requesting refills of Concerta.  He also has hypertension hyperlipidemia. Has been off cholesterol medication because of cost of medication. Blood pressure treated with lisinopril HCTZ.  Past Medical History  Diagnosis Date  . Hyperlipidemia   . Hypertension   . ADD (attention deficit disorder)   . Tobacco abuse    Past Surgical History  Procedure Laterality Date  . Knee surgery      bilateral    reports that he has been smoking Cigarettes.  He has been smoking about 1.00 pack per day. He does not have any smokeless tobacco history on file. He reports that  drinks alcohol. He reports that he does not use illicit drugs. family history includes Hypertension in his brother and father. Allergies  Allergen Reactions  . Codeine Phosphate     REACTION: swelling of uvula, hives      Review of Systems  Constitutional: Negative for fever, chills, appetite change, fatigue and unexpected weight change.  Respiratory: Positive for cough, shortness of breath and wheezing.   Cardiovascular: Negative for chest pain and leg swelling.  Endocrine: Negative for polydipsia and polyuria.  Neurological: Negative for dizziness.       Objective:   Physical Exam  Constitutional: He appears well-developed and well-nourished.   Cardiovascular: Normal rate and regular rhythm.   Pulmonary/Chest: He has wheezes. He has no rales.  Slightly diminished breath sounds throughout. No respiratory distress.          Assessment & Plan:  COPD with acute exacerbation. Patient not formally diagnosed. We discussed testing with spirometry but he has no insurance. Provided samples Spiriva one inhalation daily with instructions for use. Brief prednisone taper. Samples of Advair 250/50 one puff twice daily Cost of medications will be the major issue for him.  We may need to look at patient assistance  Attention deficit disorder. Refill Concerta Hypertension. Well controlled

## 2013-04-21 ENCOUNTER — Telehealth: Payer: Self-pay | Admitting: Internal Medicine

## 2013-04-21 NOTE — Telephone Encounter (Signed)
Pt was here is 6/17 and saw Dr. Caryl Never and was given samples of Spiriva and and inhaler. Pt states that he is out of the samples he was given and would like to see if he could get more. Pt states that he still does not have insurance but should soon and was hoping to get enough sampleas to get him till he has active insurance. Please contact pt

## 2013-04-22 MED ORDER — TIOTROPIUM BROMIDE MONOHYDRATE 18 MCG IN CAPS: 18.0000 ug | ORAL_CAPSULE | Freq: Every day | RESPIRATORY_TRACT | Status: DC

## 2013-04-22 NOTE — Telephone Encounter (Signed)
No spiriva samples available.  Called pt and informed pt that I had advair.  I printed a patient assistance application for pt to see if he could qualify for free spiriva through the mfg.  Pt aware, he will p/u everything tomorrow.  Waiting for Dr Cato Mulligan to sign application

## 2013-09-01 ENCOUNTER — Other Ambulatory Visit: Payer: Self-pay | Admitting: Internal Medicine

## 2013-09-08 ENCOUNTER — Telehealth: Payer: Self-pay | Admitting: Internal Medicine

## 2013-09-08 NOTE — Telephone Encounter (Signed)
Pt requesting refill of:  methylphenidate (CONCERTA) 36 MG CR tablet  lisinopril-hydrochlorothiazide (PRINZIDE,ZESTORETIC) 20-25 MG per tablet

## 2013-09-09 NOTE — Telephone Encounter (Signed)
Pt needs OV, hasn't been seen in over a since April 2013

## 2013-09-09 NOTE — Telephone Encounter (Signed)
Pt now has appt w/ padonda for next tues 1/13 at 10:45.  Pt states he has not been feeling well should probably  be seen before Dr Leanne Chang first available. Can you call in his BP med to [facility] [address]? Lisinopril-hydro 20-25mg 

## 2013-09-10 MED ORDER — LISINOPRIL-HYDROCHLOROTHIAZIDE 20-25 MG PO TABS: 2.0000 | ORAL_TABLET | Freq: Every day | ORAL | Status: DC

## 2013-09-10 NOTE — Telephone Encounter (Signed)
rx sent in electronically 

## 2013-09-12 NOTE — Telephone Encounter (Signed)
Opened in error

## 2013-09-16 ENCOUNTER — Encounter: Payer: Self-pay | Admitting: Family

## 2013-09-16 ENCOUNTER — Ambulatory Visit (INDEPENDENT_AMBULATORY_CARE_PROVIDER_SITE_OTHER): Payer: Medicare Other | Admitting: Family

## 2013-09-16 VITALS — BP 162/100 | HR 71 | Ht 71.0 in | Wt 266.0 lb

## 2013-09-16 DIAGNOSIS — I10 Essential (primary) hypertension: Secondary | ICD-10-CM | POA: Diagnosis not present

## 2013-09-16 DIAGNOSIS — E785 Hyperlipidemia, unspecified: Secondary | ICD-10-CM

## 2013-09-16 DIAGNOSIS — F988 Other specified behavioral and emotional disorders with onset usually occurring in childhood and adolescence: Secondary | ICD-10-CM

## 2013-09-16 DIAGNOSIS — F17200 Nicotine dependence, unspecified, uncomplicated: Secondary | ICD-10-CM

## 2013-09-16 DIAGNOSIS — J449 Chronic obstructive pulmonary disease, unspecified: Secondary | ICD-10-CM

## 2013-09-16 MED ORDER — LISINOPRIL-HYDROCHLOROTHIAZIDE 20-25 MG PO TABS: 2.0000 | ORAL_TABLET | Freq: Every day | ORAL | Status: DC

## 2013-09-16 MED ORDER — METHYLPHENIDATE HCL ER (OSM) 36 MG PO TBCR: 36.0000 mg | EXTENDED_RELEASE_TABLET | ORAL | Status: DC

## 2013-09-16 MED ORDER — METHYLPHENIDATE HCL ER (OSM) 36 MG PO TBCR: 36.0000 mg | EXTENDED_RELEASE_TABLET | Freq: Every day | ORAL | Status: DC

## 2013-09-16 NOTE — Progress Notes (Signed)
Subjective:    Patient ID: Steven Hayden, male    DOB: 09/09/1950, 63 y.o.   MRN: [id number]  HPI 63 year old white male, smoker, patient of Dr. sources in today for recheck of attention deficit disorder, COPD, tobacco use disorder, hypertension, hyperlipidemia. Reports she's been doing well. Reports increased shortness of breath recently and felt like Spiriva is not working as effectively as it once was. Is requesting to change maintenance inhaler.   Review of Systems  Constitutional: Negative.   HENT: Negative.   Eyes: Negative.   Respiratory: Negative.   Cardiovascular: Negative.   Endocrine: Negative.   Genitourinary: Negative.   Musculoskeletal: Negative.   Skin: Negative.   Neurological: Negative.   Hematological: Negative.    Past Medical History  Diagnosis Date  . Hyperlipidemia   . Hypertension   . ADD (attention deficit disorder)   . Tobacco abuse     History   Social History  . Marital Status: Divorced    Spouse Name: N/A    Number of Children: 1  . Years of Education: N/A   Occupational History  . Not on file.   Social History Main Topics  . Smoking status: Current Every Day Smoker -- 1.00 packs/day    Types: Cigarettes  . Smokeless tobacco: Not on file  . Alcohol Use: Yes  . Drug Use: No  . Sexual Activity: Not on file   Other Topics Concern  . Not on file   Social History Narrative  . No narrative on file    Past Surgical History  Procedure Laterality Date  . Knee surgery      bilateral    Family History  Problem Relation Age of Onset  . Hypertension Brother   . Hypertension Father     Allergies  Allergen Reactions  . Codeine Phosphate     REACTION: swelling of uvula, hives    Current Outpatient Prescriptions on File Prior to Visit  Medication Sig Dispense Refill  . atorvastatin (LIPITOR) 40 MG tablet Take 1 tablet (40 mg total) by mouth daily.  90 tablet  3  . predniSONE (DELTASONE) 10 MG tablet Taper as follows:  4-4-4-4-3-3-2-2-1-1  28 tablet  0   No current facility-administered medications on file prior to visit.    BP 162/100  Pulse 71  Ht 5\' 11"  (1.803 m)  Wt 266 lb (120.657 kg)  BMI 37.12 kg/m2chart    Objective:   Physical Exam  Constitutional: He is oriented to person, place, and time. He appears well-developed and well-nourished.  HENT:  Right Ear: External ear normal.  Left Ear: External ear normal.  Nose: Nose normal.  Mouth/Throat: Oropharynx is clear and moist.  Neck: Normal range of motion. Neck supple.  Cardiovascular: Normal rate, regular rhythm and normal heart sounds.   Pulmonary/Chest: Effort normal and breath sounds normal.  Abdominal: Soft. Bowel sounds are normal.  Musculoskeletal: Normal range of motion.  Neurological: He is alert and oriented to person, place, and time.  Skin: Skin is warm and dry.  Psychiatric: He has a normal mood and affect.          Assessment & Plan:  Assessment: 1. COPD 2. Attention deficit disorder 3. Tobacco use disorder 4. Hyperlipidemia 5. Hypertension  Plan: Continue current medications. Start Symbicort 160/12.5 2puffs twice a day. Sample provided today. Patient will call and let us know if this is a better option for him. Labs sent to include BMP, LFTs, CBC. Return in 6 months for fasting complete physical  exam so we can assess cholesterol. Strongly encourage smoking cessation.

## 2013-09-16 NOTE — Progress Notes (Signed)
Pre visit review using our clinic review tool, if applicable. No additional management support is needed unless otherwise documented below in the visit note. 

## 2013-09-17 ENCOUNTER — Telehealth: Payer: Self-pay | Admitting: Internal Medicine

## 2013-09-17 NOTE — Telephone Encounter (Signed)
Relevant patient education assigned to patient using Emmi. ° °

## 2013-10-07 ENCOUNTER — Telehealth: Payer: Self-pay | Admitting: Internal Medicine

## 2013-10-07 NOTE — Telephone Encounter (Signed)
Relevant patient education mailed to patient.  

## 2014-01-22 ENCOUNTER — Telehealth: Payer: Self-pay | Admitting: Internal Medicine

## 2014-01-22 NOTE — Telephone Encounter (Signed)
Pt is requesting 3 mo supply  methylphenidate (CONCERTA) 36 MG CR tablet Pt also request refills of lisinopril-hydrochlorothiazide (PRINZIDE,ZESTORETIC) 20-25 MG per tablet  90 day/  ([facility] [address])  tiotropium (SPIRIVA HANDIHALER) 18 MCG inhalation capsule  (pt needs hard copy for pt asst program)  Pt states he is out of concerta and hope to get prior to the weekend.

## 2014-01-23 MED ORDER — METHYLPHENIDATE HCL ER (OSM) 36 MG PO TBCR: 36.0000 mg | EXTENDED_RELEASE_TABLET | ORAL | Status: DC

## 2014-01-23 MED ORDER — TIOTROPIUM BROMIDE MONOHYDRATE 18 MCG IN CAPS: 18.0000 ug | ORAL_CAPSULE | Freq: Every day | RESPIRATORY_TRACT | Status: DC

## 2014-01-27 MED ORDER — METHYLPHENIDATE HCL ER (OSM) 36 MG PO TBCR: 36.0000 mg | EXTENDED_RELEASE_TABLET | ORAL | Status: DC

## 2014-01-28 NOTE — Telephone Encounter (Signed)
Pt aware that rx is up front for p/u, pt aware

## 2014-03-05 ENCOUNTER — Other Ambulatory Visit: Payer: Self-pay | Admitting: Family

## 2014-03-09 ENCOUNTER — Ambulatory Visit (INDEPENDENT_AMBULATORY_CARE_PROVIDER_SITE_OTHER): Payer: Medicare Other | Admitting: Physician Assistant

## 2014-03-09 ENCOUNTER — Encounter: Payer: Self-pay | Admitting: Physician Assistant

## 2014-03-09 VITALS — BP 110/80 | Temp 98.0°F | Wt 259.0 lb

## 2014-03-09 DIAGNOSIS — D57819 Other sickle-cell disorders with crisis, unspecified: Secondary | ICD-10-CM

## 2014-03-09 NOTE — Progress Notes (Signed)
Pre visit review using our clinic review tool, if applicable. No additional management support is needed unless otherwise documented below in the visit note. 

## 2014-03-11 NOTE — Progress Notes (Signed)
° °  Subjective:    Patient ID: Steven Hayden, male    DOB: 1951-02-21, 63 y.o.   MRN: [id number]  HPI    Review of Systems     Objective:   Physical Exam        Assessment & Plan:  Pt left office before being evaluated.

## 2014-03-18 DIAGNOSIS — L821 Other seborrheic keratosis: Secondary | ICD-10-CM | POA: Diagnosis not present

## 2014-03-18 DIAGNOSIS — L820 Inflamed seborrheic keratosis: Secondary | ICD-10-CM | POA: Diagnosis not present

## 2014-03-18 DIAGNOSIS — L723 Sebaceous cyst: Secondary | ICD-10-CM | POA: Diagnosis not present

## 2014-05-15 ENCOUNTER — Telehealth: Payer: Self-pay | Admitting: Internal Medicine

## 2014-05-15 MED ORDER — TIOTROPIUM BROMIDE MONOHYDRATE 18 MCG IN CAPS: 18.0000 ug | ORAL_CAPSULE | Freq: Every day | RESPIRATORY_TRACT | Status: DC

## 2014-05-15 NOTE — Telephone Encounter (Signed)
Pt need a rx for the following med tiotropium (SPIRIVA HANDIHALER) 18 MCG inhalation capsule.. The company that he gets the med from need a new rx faed to them so that they can mail out his medicine.He is a previous [facility] pt and will est with [place]

## 2014-05-17 ENCOUNTER — Emergency Department (HOSPITAL_COMMUNITY): Payer: Medicare Other

## 2014-05-17 ENCOUNTER — Encounter (HOSPITAL_COMMUNITY): Payer: Self-pay | Admitting: Emergency Medicine

## 2014-05-17 ENCOUNTER — Inpatient Hospital Stay (HOSPITAL_COMMUNITY): Payer: Medicare Other

## 2014-05-17 ENCOUNTER — Inpatient Hospital Stay (HOSPITAL_COMMUNITY)
Admission: EM | Admit: 2014-05-17 | Discharge: 2014-05-21 | DRG: 238 | Disposition: A | Discharge status: Home / Self Care | Payer: Medicare Other | Attending: Vascular Surgery | Admitting: Vascular Surgery

## 2014-05-17 DIAGNOSIS — J449 Chronic obstructive pulmonary disease, unspecified: Secondary | ICD-10-CM | POA: Diagnosis not present

## 2014-05-17 DIAGNOSIS — I714 Abdominal aortic aneurysm, without rupture, unspecified: Principal | ICD-10-CM

## 2014-05-17 DIAGNOSIS — R109 Unspecified abdominal pain: Secondary | ICD-10-CM | POA: Diagnosis not present

## 2014-05-17 DIAGNOSIS — R0681 Apnea, not elsewhere classified: Secondary | ICD-10-CM | POA: Diagnosis not present

## 2014-05-17 DIAGNOSIS — J4489 Other specified chronic obstructive pulmonary disease: Secondary | ICD-10-CM | POA: Diagnosis not present

## 2014-05-17 DIAGNOSIS — I10 Essential (primary) hypertension: Secondary | ICD-10-CM | POA: Diagnosis present

## 2014-05-17 DIAGNOSIS — F10239 Alcohol dependence with withdrawal, unspecified: Secondary | ICD-10-CM | POA: Diagnosis present

## 2014-05-17 DIAGNOSIS — F17200 Nicotine dependence, unspecified, uncomplicated: Secondary | ICD-10-CM | POA: Diagnosis present

## 2014-05-17 DIAGNOSIS — F1020 Alcohol dependence, uncomplicated: Secondary | ICD-10-CM | POA: Diagnosis present

## 2014-05-17 DIAGNOSIS — Z9889 Other specified postprocedural states: Secondary | ICD-10-CM | POA: Insufficient documentation

## 2014-05-17 DIAGNOSIS — F988 Other specified behavioral and emotional disorders with onset usually occurring in childhood and adolescence: Secondary | ICD-10-CM | POA: Diagnosis present

## 2014-05-17 DIAGNOSIS — R3919 Other difficulties with micturition: Secondary | ICD-10-CM | POA: Diagnosis not present

## 2014-05-17 DIAGNOSIS — F10939 Alcohol use, unspecified with withdrawal, unspecified: Secondary | ICD-10-CM | POA: Diagnosis present

## 2014-05-17 DIAGNOSIS — E785 Hyperlipidemia, unspecified: Secondary | ICD-10-CM | POA: Diagnosis present

## 2014-05-17 DIAGNOSIS — Z8249 Family history of ischemic heart disease and other diseases of the circulatory system: Secondary | ICD-10-CM

## 2014-05-17 DIAGNOSIS — F05 Delirium due to known physiological condition: Secondary | ICD-10-CM | POA: Diagnosis not present

## 2014-05-17 DIAGNOSIS — Z01818 Encounter for other preprocedural examination: Secondary | ICD-10-CM | POA: Diagnosis not present

## 2014-05-17 DIAGNOSIS — Z95828 Presence of other vascular implants and grafts: Secondary | ICD-10-CM | POA: Diagnosis not present

## 2014-05-17 HISTORY — DX: Chronic obstructive pulmonary disease, unspecified: J44.9

## 2014-05-17 LAB — URINALYSIS, ROUTINE W REFLEX MICROSCOPIC
BILIRUBIN URINE: NEGATIVE
Bilirubin Urine: NEGATIVE
GLUCOSE, UA: NEGATIVE mg/dL
GLUCOSE, UA: NEGATIVE mg/dL
Hgb urine dipstick: NEGATIVE
Hgb urine dipstick: NEGATIVE
KETONES UR: NEGATIVE mg/dL
Ketones, ur: NEGATIVE mg/dL
LEUKOCYTES UA: NEGATIVE
Leukocytes, UA: NEGATIVE
Nitrite: NEGATIVE
Nitrite: NEGATIVE
PROTEIN: NEGATIVE mg/dL
PROTEIN: NEGATIVE mg/dL
SPECIFIC GRAVITY, URINE: 1.021 (ref 1.005–1.030)
Specific Gravity, Urine: 1.024 (ref 1.005–1.030)
UROBILINOGEN UA: 1 mg/dL (ref 0.0–1.0)
Urobilinogen, UA: 0.2 mg/dL (ref 0.0–1.0)
pH: 7.5 (ref 5.0–8.0)
pH: 6 (ref 5.0–8.0)

## 2014-05-17 LAB — I-STAT CHEM 8, ED
BUN: 11 mg/dL (ref 6–23)
Calcium, Ion: 1.1 mmol/L — ABNORMAL LOW (ref 1.13–1.30)
Chloride: 98 mEq/L (ref 96–112)
Creatinine, Ser: 1.1 mg/dL (ref 0.50–1.35)
Glucose, Bld: 106 mg/dL — ABNORMAL HIGH (ref 70–99)
HCT: 51 % (ref 39.0–52.0)
HEMOGLOBIN: 17.3 g/dL — AB (ref 13.0–17.0)
POTASSIUM: 3.9 meq/L (ref 3.7–5.3)
SODIUM: 137 meq/L (ref 137–147)
TCO2: 33 mmol/L (ref 0–100)

## 2014-05-17 LAB — MRSA PCR SCREENING: MRSA by PCR: NEGATIVE

## 2014-05-17 LAB — CBC
HCT: 46.4 % (ref 39.0–52.0)
Hemoglobin: 16.2 g/dL (ref 13.0–17.0)
MCH: 33.1 pg (ref 26.0–34.0)
MCHC: 34.9 g/dL (ref 30.0–36.0)
MCV: 94.7 fL (ref 78.0–100.0)
Platelets: 213 10*3/uL (ref 150–400)
RBC: 4.9 MIL/uL (ref 4.22–5.81)
RDW: 13 % (ref 11.5–15.5)
WBC: 10.8 10*3/uL — ABNORMAL HIGH (ref 4.0–10.5)

## 2014-05-17 LAB — TYPE AND SCREEN
ABO/RH(D): A POS
ANTIBODY SCREEN: NEGATIVE

## 2014-05-17 LAB — PREPARE RBC (CROSSMATCH)

## 2014-05-17 LAB — CREATININE, SERUM
CREATININE: 1.02 mg/dL (ref 0.50–1.35)
GFR calc Af Amer: 89 mL/min — ABNORMAL LOW (ref >90)
GFR, EST NON AFRICAN AMERICAN: 77 mL/min — AB (ref >90)

## 2014-05-17 LAB — ABO/RH
ABO/RH(D): A POS
ABO/RH(D): A POS

## 2014-05-17 MED ORDER — ONDANSETRON HCL 4 MG/2ML IJ SOLN
4.0000 mg | Freq: Once | INTRAMUSCULAR | Status: AC
  Administered 2014-05-17: 4 mg via INTRAVENOUS
  Filled 2014-05-17: qty 2

## 2014-05-17 MED ORDER — PANTOPRAZOLE SODIUM 40 MG PO TBEC
40.0000 mg | DELAYED_RELEASE_TABLET | Freq: Every day | ORAL | Status: DC
  Administered 2014-05-17 – 2014-05-21 (×4): 40 mg via ORAL
  Filled 2014-05-17 (×4): qty 1

## 2014-05-17 MED ORDER — GUAIFENESIN-DM 100-10 MG/5ML PO SYRP: 15.0000 mL | ORAL_SOLUTION | ORAL | Status: DC | PRN

## 2014-05-17 MED ORDER — ALUM & MAG HYDROXIDE-SIMETH 200-200-20 MG/5ML PO SUSP: 15.0000 mL | ORAL | Status: DC | PRN

## 2014-05-17 MED ORDER — SODIUM CHLORIDE 0.9 % IV SOLN
INTRAVENOUS | Status: DC
  Administered 2014-05-17: 125 mL/h via INTRAVENOUS

## 2014-05-17 MED ORDER — FLEET ENEMA 7-19 GM/118ML RE ENEM
1.0000 | ENEMA | Freq: Once | RECTAL | Status: AC | PRN
  Filled 2014-05-17: qty 1

## 2014-05-17 MED ORDER — ONDANSETRON HCL 4 MG/2ML IJ SOLN: 4.0000 mg | Freq: Four times a day (QID) | INTRAMUSCULAR | Status: DC | PRN

## 2014-05-17 MED ORDER — ACETAMINOPHEN 325 MG RE SUPP
325.0000 mg | RECTAL | Status: DC | PRN
  Filled 2014-05-17: qty 2

## 2014-05-17 MED ORDER — ACETAMINOPHEN 325 MG PO TABS
325.0000 mg | ORAL_TABLET | ORAL | Status: DC | PRN
  Administered 2014-05-19 – 2014-05-21 (×5): 650 mg via ORAL
  Filled 2014-05-17 (×5): qty 2

## 2014-05-17 MED ORDER — HYDROMORPHONE HCL PF 1 MG/ML IJ SOLN
0.5000 mg | INTRAMUSCULAR | Status: DC | PRN
  Administered 2014-05-17 – 2014-05-19 (×8): 1 mg via INTRAVENOUS
  Administered 2014-05-19: 0.5 mg via INTRAVENOUS
  Administered 2014-05-20 (×2): 1 mg via INTRAVENOUS
  Filled 2014-05-17 (×13): qty 1

## 2014-05-17 MED ORDER — SODIUM CHLORIDE 0.9 % IV SOLN: Freq: Once | INTRAVENOUS | Status: DC

## 2014-05-17 MED ORDER — HYDROCHLOROTHIAZIDE 25 MG PO TABS
25.0000 mg | ORAL_TABLET | Freq: Every day | ORAL | Status: DC
  Administered 2014-05-19 – 2014-05-21 (×3): 25 mg via ORAL
  Filled 2014-05-17 (×5): qty 1

## 2014-05-17 MED ORDER — OXYCODONE HCL 5 MG PO TABS
5.0000 mg | ORAL_TABLET | ORAL | Status: DC | PRN
  Administered 2014-05-18: 10 mg via ORAL
  Filled 2014-05-17: qty 2

## 2014-05-17 MED ORDER — LABETALOL HCL 5 MG/ML IV SOLN
10.0000 mg | INTRAVENOUS | Status: DC | PRN
  Administered 2014-05-17: 10 mg via INTRAVENOUS
  Filled 2014-05-17 (×2): qty 4

## 2014-05-17 MED ORDER — SENNOSIDES-DOCUSATE SODIUM 8.6-50 MG PO TABS
1.0000 | ORAL_TABLET | Freq: Every evening | ORAL | Status: DC | PRN
  Filled 2014-05-17: qty 1

## 2014-05-17 MED ORDER — BISACODYL 10 MG RE SUPP: 10.0000 mg | Freq: Every day | RECTAL | Status: DC | PRN

## 2014-05-17 MED ORDER — PHENOL 1.4 % MT LIQD: 1.0000 | OROMUCOSAL | Status: DC | PRN

## 2014-05-17 MED ORDER — LISINOPRIL 20 MG PO TABS
20.0000 mg | ORAL_TABLET | Freq: Every day | ORAL | Status: DC
  Administered 2014-05-20 – 2014-05-21 (×2): 20 mg via ORAL
  Filled 2014-05-17 (×5): qty 1

## 2014-05-17 MED ORDER — LISINOPRIL-HYDROCHLOROTHIAZIDE 20-25 MG PO TABS: 1.0000 | ORAL_TABLET | Freq: Every day | ORAL | Status: DC

## 2014-05-17 MED ORDER — TIOTROPIUM BROMIDE MONOHYDRATE 18 MCG IN CAPS
18.0000 ug | ORAL_CAPSULE | Freq: Every day | RESPIRATORY_TRACT | Status: DC
  Administered 2014-05-18 – 2014-05-20 (×3): 18 ug via RESPIRATORY_TRACT
  Filled 2014-05-17 (×2): qty 5

## 2014-05-17 MED ORDER — DOCUSATE SODIUM 100 MG PO CAPS
100.0000 mg | ORAL_CAPSULE | Freq: Two times a day (BID) | ORAL | Status: DC
  Administered 2014-05-18 – 2014-05-20 (×4): 100 mg via ORAL
  Filled 2014-05-17 (×5): qty 1

## 2014-05-17 MED ORDER — POTASSIUM CHLORIDE CRYS ER 20 MEQ PO TBCR
20.0000 meq | EXTENDED_RELEASE_TABLET | Freq: Once | ORAL | Status: DC
  Filled 2014-05-17: qty 2

## 2014-05-17 MED ORDER — ENOXAPARIN SODIUM 40 MG/0.4ML ~~LOC~~ SOLN
40.0000 mg | SUBCUTANEOUS | Status: DC
  Administered 2014-05-17 – 2014-05-20 (×4): 40 mg via SUBCUTANEOUS
  Filled 2014-05-17 (×6): qty 0.4

## 2014-05-17 MED ORDER — METOPROLOL TARTRATE 1 MG/ML IV SOLN: 2.0000 mg | INTRAVENOUS | Status: DC | PRN

## 2014-05-17 MED ORDER — HYDRALAZINE HCL 20 MG/ML IJ SOLN
10.0000 mg | INTRAMUSCULAR | Status: DC | PRN
  Filled 2014-05-17: qty 1

## 2014-05-17 MED ORDER — IOHEXOL 350 MG/ML SOLN
100.0000 mL | Freq: Once | INTRAVENOUS | Status: AC | PRN
  Administered 2014-05-17: 100 mL via INTRAVENOUS

## 2014-05-17 MED ORDER — FENTANYL CITRATE 0.05 MG/ML IJ SOLN
100.0000 ug | Freq: Once | INTRAMUSCULAR | Status: AC
  Administered 2014-05-17: 100 ug via INTRAVENOUS
  Filled 2014-05-17: qty 2

## 2014-05-17 MED ORDER — HYDROMORPHONE HCL PF 1 MG/ML IJ SOLN
1.0000 mg | Freq: Once | INTRAMUSCULAR | Status: AC
  Administered 2014-05-17: 1 mg via INTRAVENOUS
  Filled 2014-05-17: qty 1

## 2014-05-17 MED ORDER — ATORVASTATIN CALCIUM 40 MG PO TABS
40.0000 mg | ORAL_TABLET | Freq: Every day | ORAL | Status: DC
  Administered 2014-05-19 – 2014-05-21 (×3): 40 mg via ORAL
  Filled 2014-05-17 (×5): qty 1

## 2014-05-17 NOTE — ED Notes (Signed)
Patient transported to [facility] ED by [place]. Report given to [facility].

## 2014-05-17 NOTE — H&P (Signed)
Referred by: [facility] ED  Reason for referral: AAA  History of Present Illness  The patient is a 63 y.o. (01/02/1951) male who presents with chief complaint: back and hip pain.  Onset ~1.5 days ago, decreased as severe back and bilateral hip pain with radiation down left leg.  The patient notes prior history of kidney stone which had similar sx to this episode.  The patient denies any embolic sx previously.  Risk factors for AAA included: sex, age, and active smoking.  There is no family history of aneurysmal disease.  Past Medical History  Diagnosis Date  . Hyperlipidemia   . Hypertension   . ADD (attention deficit disorder)   . Tobacco abuse   . COPD (chronic obstructive pulmonary disease)     Past Surgical History  Procedure Laterality Date  . Knee surgery      bilateral    History   Social History  . Marital Status: Divorced    Spouse Name: N/A    Number of Children: 1  . Years of Education: N/A   Occupational History  . Not on file.   Social History Main Topics  . Smoking status: Current Every Day Smoker -- 1.00 packs/day    Types: Cigarettes  . Smokeless tobacco: Not on file  . Alcohol Use: Yes  . Drug Use: No  . Sexual Activity: Not on file   Other Topics Concern  . Not on file   Social History Narrative  . No narrative on file    Family History  Problem Relation Age of Onset  . Hypertension Brother   . Hypertension Father     No current facility-administered medications on file prior to encounter.   Current Outpatient Prescriptions on File Prior to Encounter  Medication Sig Dispense Refill  . lisinopril-hydrochlorothiazide (PRINZIDE,ZESTORETIC) 20-25 MG per tablet TAKE TWO TABLETS BY MOUTH DAILY   60 tablet  2  . methylphenidate (CONCERTA) 36 MG PO CR tablet Take 1 tablet (36 mg total) by mouth every morning.  30 tablet  0  . tiotropium (SPIRIVA HANDIHALER) 18 MCG inhalation capsule Place 1 capsule (18 mcg total) into inhaler and inhale daily.   90 capsule  3    Allergies  Allergen Reactions  . Codeine Phosphate     REACTION: swelling of uvula, hives    REVIEW OF SYSTEMS:  (Positives checked otherwise negative)  CARDIOVASCULAR:  []  chest pain, []  chest pressure, []  palpitations, []  shortness of breath when laying flat, []  shortness of breath with exertion,  []  pain in feet when walking, []  pain in feet when laying flat, []  history of blood clot in veins (DVT), []  history of phlebitis, []  swelling in legs, []  varicose veins  PULMONARY:  []  productive cough, []  asthma, []  wheezing, [x]  COPD  NEUROLOGIC:  []  weakness in arms or legs, []  numbness in arms or legs, []  difficulty speaking or slurred speech, []  temporary loss of vision in one eye, []  dizziness  HEMATOLOGIC:  []  bleeding problems, []  problems with blood clotting too easily  MUSCULOSKEL:  [x]  joint pain: bilateral hips, []  joint swelling  GASTROINTEST:  []  vomiting blood, []  blood in stool     GENITOURINARY:  []  burning with urination, []  blood in urine, [x]  prior kidney stone  PSYCHIATRIC:  []  history of major depression  INTEGUMENTARY:  []  rashes, []  ulcers  CONSTITUTIONAL:  []  fever, []  chills   For VQI Use Only  PRE-ADM LIVING: Home  AMB STATUS: Ambulatory  CAD Sx:  None  PRIOR CHF: None  STRESS TEST: [x]  No, [ ]  Normal, [ ]  + ischemia, [ ]  + MI, [ ]  Both    Physical Examination  Filed Vitals:   05/17/14 1212 05/17/14 1241 05/17/14 1330 05/17/14 1434  BP:  115/82 112/67 114/75  Pulse:  78 77 71  Temp:  100 F (37.8 C)    TempSrc:  Oral    Resp:  18 18   SpO2: 92% 98% 95% 95%   There is no weight on file to calculate BMI.  General: A&O x 3, WD, morbidly obese  Head: [place]/AT  Ear/Nose/Throat: Hearing grossly intact, nares w/o erythema or drainage, oropharynx w/o Erythema/Exudate  Eyes: PERRLA, EOMI  Neck: Supple, no nuchal rigidity, no palpable LAD  Pulmonary: Sym exp, good air movt, CTAB, no rales, rhonchi, & wheezing  Cardiac:  RRR, Nl S1, S2, no Murmurs, rubs or gallops  Vascular: Vessel Right Left  Radial Palpable Palpable  Brachial Palpable Palpable  Carotid Palpable, without bruit Palpable, without bruit  Aorta Not palpable due to pannus N/A  Femoral Palpable Palpable  Popliteal Not palpable Not palpable  PT Palpable Palpable  DP Palpable Palpable   Gastrointestinal: soft, NT, -G/R, - HSM, - masses, - CVAT B, large pannus  Musculoskeletal: M/S 5/5 throughout , pain in back with positional change, TTP L paraspinal region, no SLR, Extremities without ischemic changes   Neurologic: CN 2-12 intact , Pain and light touch intact in extremities , Motor exam as listed above  Psychiatric: Judgment intact, Mood & affect appropriate for pt's clinical situation: scared   Dermatologic: See M/S exam for extremity exam, no rashes otherwise noted,  Lymph : No Cervical, Axillary, or Inguinal lymphadenopathy   Laboratory: CBC:    Component Value Date/Time   HGB 17.3* 05/17/2014 1253   HCT 51.0 05/17/2014 1253    BMP:    Component Value Date/Time   NA 137 05/17/2014 1253   K 3.9 05/17/2014 1253   CL 98 05/17/2014 1253   CO2 32 12/12/2011 0901   GLUCOSE 106* 05/17/2014 1253   BUN 11 05/17/2014 1253   CREATININE 1.10 05/17/2014 1253   CALCIUM 9.1 12/12/2011 0901   GFRNONAA 76.17 06/07/2010 1019   GFRAA 99 07/14/2008 1213    Coagulation: No results found for this basename: INR, PROTIME   No results found for this basename: PTT   Urinalysis    Component Value Date/Time   COLORURINE YELLOW 05/17/2014 1116   APPEARANCEUR CLEAR 05/17/2014 1116   LABSPEC 1.021 05/17/2014 1116   PHURINE 7.5 05/17/2014 1116   GLUCOSEU NEGATIVE 05/17/2014 1116   HGBUR NEGATIVE 05/17/2014 1116   BILIRUBINUR NEGATIVE 05/17/2014 1116   KETONESUR NEGATIVE 05/17/2014 1116   PROTEINUR NEGATIVE 05/17/2014 1116   UROBILINOGEN 1.0 05/17/2014 1116   NITRITE NEGATIVE 05/17/2014 1116   LEUKOCYTESUR NEGATIVE 05/17/2014 1116   Radiology: Ct Abdomen  Pelvis Wo Contrast  05/17/2014   CLINICAL DATA:  Left flank pain.  Difficulty urinating.  Nausea.  EXAM: CT ABDOMEN AND PELVIS WITHOUT CONTRAST  TECHNIQUE: Multidetector CT imaging of the abdomen and pelvis was performed following the standard protocol without IV contrast.  COMPARISON:  None.  FINDINGS: The lung bases are clear. Visualized portion the heart is normal in size.  There is a a large bilobed infrarenal abdominal aneurysm. The aneurysm begins immediately caudal to the takeoff of the renal arteries. The smaller portion of the aneurysm is proximal, and measures up to approximately 7.5 cm AP diameter. The larger component of  the aneurysm measures 9.3 cm AP diameter by 10.4 cm transverse diameter. In total, the entire aneurysm measures approximately 16 cm in sagittal length. In the largest portion of the aneurysm, there is a central area of higher density consistent with the central lumen. Surrounding this, there is a area of lower density that may reflect intramural thrombus. There are atherosclerotic calcifications scattered around the peripheral margin of the aneurysm, but there also intimal calcifications that are medially displaced. Between the medially displaced calcifications and the periphery of the aneurysm, the central density measures up to 30 Hounsfield units, for which intramural hematoma cannot be excluded.  Along the posterior and left lateral aspect of the inferior portion of the aneurysm is stranding and or fluid in the retroperitoneal fat. This could reflect inflammatory changes related to the aneurysm, or potentially micro leaks of hemorrhage from the aneurysm. This fluid and/or inflammation overlies the left psoas muscle and is in the region of the left ureter, likely the cause of the left flank pain. There is no large amount of retroperitoneal hemorrhage to suggest gross rupture.  The iliac arteries are heavily calcified but normal in caliber.  No free fluid in the pelvis.  The kidneys  are normal in size and contour. Negative for renal stones. Both ureters are normal in caliber. Negative for ureteral stones. The urinary bladder is decompressed and unremarkable.  Noncontrast appearance of the liver, gallbladder, spleen, adrenal glands, and pancreas is within normal limits. Bowel loops are nondistended. Appendix is normal. Normal prostate gland.  Lumbar and lower thoracic spine vertebral bodies are normal in height and alignment. Degenerative disc disease at L5-S1.  IMPRESSION: 1. Large infrarenal abdominal aortic aneurysm, measures up to 9.3 x 10.4 x 16 cm (AP by transverse by sagittal length). Given medial displacement of intimal calcifications, intramural hematoma is not excluded. Small amounts of retroperitoneal stranding and fluid along the posterior and left lateral aspect of the aneurysm as described above could reflect inflammation associated with the aneurysm or small micro leaks of blood. Critical Value/emergent results were called by telephone at the time of interpretation on 05/17/2014 at 11:56 am to Dr. Lajean Saver , who verbally acknowledged these results. 2. Negative for urinary tract stone disease or obstruction.   Electronically Signed   By: Curlene Dolphin M.D.   On: 05/17/2014 12:06   Ct Angio Abd/pel W/ And/or W/o  05/17/2014   CLINICAL DATA:  AAA seen on Stone study, transferred for vascular sx, AAA seen on Stone study, transferred for vascular sx, per  EXAM: CT ANGIOGRAPHY ABDOMEN AND PELVIS  TECHNIQUE: Multidetector CT imaging of the abdomen and pelvis was performed using the standard protocol during bolus administration of intravenous contrast. Multiplanar reconstructed images including MIPs were obtained and reviewed to evaluate the vascular anatomy.  CONTRAST:  169mL OMNIPAQUE IOHEXOL 350 MG/ML SOLN  FINDINGS: ARTERIAL FINDINGS:  Aorta: Visualized distal descending thoracic and suprarenal segments demonstrate Mild plaque. The infrarenal segment is markedly tortuous with a  long fusiform aneurysm. The proximal segment of the aneurysm measures approximately 4.8 cm diameter, with a greater than 90 degree bend into the more dominant inferior distal aneurysmal segment measuring up to 10.3 x 9.5 cm transverse dimensions. There are lamellated calcifications within mural thrombus within the lumen. The aneurysm tapers to a diameter of 6.9 cm at the level of the bifurcation. There are retroperitoneal inflammatory/ edematous changes at the left inferolateral margin of the aneurysm extending below the bifurcation, a sign associated with impending rupture. No active extravasation.  No retroperitoneal hematoma.  Celiac axis: Mild ostial plaque without significant stenosis, patent distally  Superior mesenteric:  Patent, classic distal branch anatomy  Left renal:           Duplicated, co-dominant, both widely patent.  Right renal:          Single, with mild nonocclusive ostial plaque.  Inferior mesenteric: Patent, arising from the proximal margin of the aneurysmal segment of the aorta.  Left iliac: Common iliac is Dilated with moderate calcified plaque, measuring 17 mm diameter distally. There is eccentric plaque scattered through the internal and external iliac arteries without high-grade stenosis or dilatation.  Right iliac: Common iliac is dilated to 2.3 cm diameter just below the aortic bifurcation, tapering to a 15 mm at its bifurcation. Internal iliac is ectatic. External iliac has some eccentric nonocclusive plaque, no aneurysm.  Venous findings: Patent hepatic veins, portal vein, superior mesenteric vein, splenic vein, bilateral renal veins, IVC, and visualized iliac venous system.  Review of the MIP images confirms the above findings.  Nonvascular findings: Visualized lung bases clear. Unremarkable liver, spleen, physiologically distended gallbladder, adrenal glands, kidneys, pancreas. Stomach, small bowel, and colon are nondilated. Normal appendix. Urinary bladder incompletely distended.  Mild prostatic enlargement. No ascites. No free air. No adenopathy localized. Degenerative disc disease L5-S1.  IMPRESSION: 1. 10.3 cm markedly tortuous infrarenal abdominal aortic aneurysm. There are Adjacent retroperitoneal inflammatory/edematous changes, a sign associated with impending rupture. 2. Ectatic common iliac arteries, right 23 mm, left 17 mm.   Electronically Signed   By: Arne Cleveland M.D.   On: 05/17/2014 14:02   I have reviewed the CT and CTA, I agree that there is a large infrarenal AAA that remains intact.  There is some inflammatory changes more marked on the non-con CT.  There is a marked right turn in the patent lumen ~6-7 cm distal to the aortic neck but the neck itself is not at a 90 degree angle.  There appears to be 15-20 mm aortic depending on the measurement.  PACS is down, so centerline measurements cannot be obtain via 3D reconstruction.  There is scatter iliac disease but the tortuousity in the iliac arteries does not appear to be too severe.  This AAA may be compatible with a Gore C3 endograft +/- Aptus endostapler due to angulation of neck.   Medical Decision Making  The patient is a 63 y.o. male who presents with: large AAA, back pain.   I'm not convinced this is a sx AAA as I can reproduce the pain with palpating the L paraspinal region.  Additionally, sx AAA don't usually cause radiation of pain down a leg.  On the CT, there is no evidence of frank rupture at this point.  Regardless, given the large size of this AAA, an expedited but not emergent repair is recommended.  This patient is obese and has COPD, further complicating an open repair, so I would favor an EVAR if his anatomy is compatible with such.    If he needs to repaired in an open fashion, further medical optimization including Cardiology and Pulmonary involvement is recommended.  Pt will be admitted to SICU for hydration, medical optimization, pain control and BP control.    If his sx worsen,  he may need to go the OR emergently tonight.  If his sx remain stable overnight and his anatomy is deemed compatible with EVAR, will plan on proceeding with repair tomorrow.  The patient is aware the risks of open aortic surgery include  but are not limited to: bleeding, need for transfusion, infection, death, stroke, paralysis, wound complications, bowel injuries, impotence, bowel ischemia, extended ventilation and future ventral hernias.  Overall, I cited a mortality rate of 5-10% and morbidity rate of 30%.  In his case, the mortality may be as high as 20% if done in an emergent fashion. The patient is aware the risks of endovascular aortic surgery include but are not limited to: bleeding, need for transfusion, infection, death, stroke, paralysis, wound complications, bowel ischemia, extended ventilation, anaphylactic reaction to contrast, contrast induced nephropathy, embolism, and need for additional procedure to address endoleaks.  Overall, I cited a mortality rate of 1-2% and morbidity rate of 15%. I discussed all of this with his brother and sister, an ER Therapist, sports. The patient is willing to proceed with repair, regardless of the technique.    Thank you for allowing [place] to participate in this patient's care.  Adele Barthel, MD [facility] Office: [phone] Pager: [phone]  05/17/2014, 3:20 PM

## 2014-05-17 NOTE — Discharge Instructions (Signed)
Transfer to [facility] ED.  Page Dr Bridgett Larsson, vascular surgeon, as soon as patient arrives to [facility] ED. Upon arrival to [facility] ED, make sure to send Type and Cross, and get CTA abd and pelvis immediately.

## 2014-05-17 NOTE — ED Notes (Addendum)
Pt from home c/o left flank pain that started three. Seen at [facility] and was sent here.10/10 pain. Difficulty urinating.

## 2014-05-17 NOTE — Progress Notes (Signed)
   Interval Progress Note  Filed Vitals:   05/17/14 1600 05/17/14 1700 05/17/14 1800 05/17/14 1900  BP: 116/83 121/80 119/70 129/77  Pulse: 85 76 85 82  Temp:      TempSrc:      Resp:  15 19 26   SpO2: 93% 93% 92% 89%   Pt currently comfortable with pain minimal.  BP remains stable.  We discuss plan to proceed with urgent EVAR tomorrow if anatomy compatible with Gore C3.   Adele Barthel, MD [facility] [place] Office: [phone] Pager: [phone]  05/17/2014, 7:37 PM

## 2014-05-17 NOTE — ED Provider Notes (Signed)
CSN: [id number]     Arrival date & time 05/17/14  1015 History   First MD Initiated Contact with Patient 05/17/14 1052     Chief Complaint  Patient presents with  . Flank Pain     (Consider location/radiation/quality/duration/timing/severity/associated sxs/prior Treatment) Patient is a 63 y.o. male presenting with flank pain. The history is provided by the patient.  Flank Pain Pertinent negatives include no chest pain, no abdominal pain, no headaches and no shortness of breath.  pt c/o acute onset left flank pain posteriorly yesterday. Pain constant, dull, severe, waxes and wanes in intensity. No specific exacerbation or alleviating factors. Radiates towards LLQ.  Nausea. No vomiting. Having normal bms. Denies dysuria or hematuria. No associated fever or chills. Similar to remote hx kidney stone. No scrotal or testicular pain.    Past Medical History  Diagnosis Date  . Hyperlipidemia   . Hypertension   . ADD (attention deficit disorder)   . Tobacco abuse    Past Surgical History  Procedure Laterality Date  . Knee surgery      bilateral   Family History  Problem Relation Age of Onset  . Hypertension Brother   . Hypertension Father    History  Substance Use Topics  . Smoking status: Current Every Day Smoker -- 1.00 packs/day    Types: Cigarettes  . Smokeless tobacco: Not on file  . Alcohol Use: Yes    Review of Systems  Constitutional: Negative for fever and chills.  HENT: Negative for sore throat.   Eyes: Negative for redness.  Respiratory: Negative for shortness of breath.   Cardiovascular: Negative for chest pain.  Gastrointestinal: Negative for vomiting, abdominal pain and diarrhea.  Genitourinary: Positive for flank pain.  Musculoskeletal: Negative for back pain and neck pain.  Skin: Negative for rash.  Neurological: Negative for headaches.  Hematological: Does not bruise/bleed easily.  Psychiatric/Behavioral: Negative for confusion.      Allergies   Codeine phosphate  Home Medications   Prior to Admission medications   Medication Sig Start Date End Date Taking? Authorizing Provider  atorvastatin (LIPITOR) 40 MG tablet Take 40 mg by mouth daily.   Yes Historical Provider, MD  lisinopril-hydrochlorothiazide (PRINZIDE,ZESTORETIC) 20-25 MG per tablet TAKE TWO TABLETS BY MOUTH DAILY  03/05/14  Yes Timoteo Gaul, FNP  methylphenidate (CONCERTA) 36 MG PO CR tablet Take 1 tablet (36 mg total) by mouth every morning. 01/27/14  Yes Lisabeth Pick, MD  tiotropium (SPIRIVA HANDIHALER) 18 MCG inhalation capsule Place 1 capsule (18 mcg total) into inhaler and inhale daily. 05/15/14  Yes Zenaida Niece, PA-C   BP 95/72  Pulse 94  Temp(Src) 98.5 F (36.9 C) (Oral)  Resp 20  SpO2 95% Physical Exam  Nursing note and vitals reviewed. Constitutional: He is oriented to person, place, and time. He appears well-developed and well-nourished.  Very uncomfortable appearing.   HENT:  Mouth/Throat: Oropharynx is clear and moist.  Eyes: Conjunctivae are normal. No scleral icterus.  Neck: Neck supple. No tracheal deviation present.  Cardiovascular: Normal rate, regular rhythm, normal heart sounds and intact distal pulses.   Pulmonary/Chest: Effort normal and breath sounds normal. No accessory muscle usage. No respiratory distress.  Abdominal: Soft. Bowel sounds are normal. He exhibits no distension and no mass. There is no tenderness. There is no rebound and no guarding.  Genitourinary:  Normal ext genitalia. No scrotal or testicular swelling or tenderness. No cva tenderness.  Musculoskeletal: Normal range of motion. He exhibits no edema and no tenderness.  Neurological: He is alert and oriented to person, place, and time.  Skin: Skin is warm and dry.  Psychiatric: He has a normal mood and affect.    ED Course  Procedures (including critical care time) Labs Review  Results for orders placed during the hospital encounter of 05/17/14  URINALYSIS,  ROUTINE W REFLEX MICROSCOPIC      Result Value Ref Range   Color, Urine YELLOW  YELLOW   APPearance CLEAR  CLEAR   Specific Gravity, Urine 1.021  1.005 - 1.030   pH 7.5  5.0 - 8.0   Glucose, UA NEGATIVE  NEGATIVE mg/dL   Hgb urine dipstick NEGATIVE  NEGATIVE   Bilirubin Urine NEGATIVE  NEGATIVE   Ketones, ur NEGATIVE  NEGATIVE mg/dL   Protein, ur NEGATIVE  NEGATIVE mg/dL   Urobilinogen, UA 1.0  0.0 - 1.0 mg/dL   Nitrite NEGATIVE  NEGATIVE   Leukocytes, UA NEGATIVE  NEGATIVE   Ct Abdomen Pelvis Wo Contrast  05/17/2014   CLINICAL DATA:  Left flank pain.  Difficulty urinating.  Nausea.  EXAM: CT ABDOMEN AND PELVIS WITHOUT CONTRAST  TECHNIQUE: Multidetector CT imaging of the abdomen and pelvis was performed following the standard protocol without IV contrast.  COMPARISON:  None.  FINDINGS: The lung bases are clear. Visualized portion the heart is normal in size.  There is a a large bilobed infrarenal abdominal aneurysm. The aneurysm begins immediately caudal to the takeoff of the renal arteries. The smaller portion of the aneurysm is proximal, and measures up to approximately 7.5 cm AP diameter. The larger component of the aneurysm measures 9.3 cm AP diameter by 10.4 cm transverse diameter. In total, the entire aneurysm measures approximately 16 cm in sagittal length. In the largest portion of the aneurysm, there is a central area of higher density consistent with the central lumen. Surrounding this, there is a area of lower density that may reflect intramural thrombus. There are atherosclerotic calcifications scattered around the peripheral margin of the aneurysm, but there also intimal calcifications that are medially displaced. Between the medially displaced calcifications and the periphery of the aneurysm, the central density measures up to 30 Hounsfield units, for which intramural hematoma cannot be excluded.  Along the posterior and left lateral aspect of the inferior portion of the aneurysm is  stranding and or fluid in the retroperitoneal fat. This could reflect inflammatory changes related to the aneurysm, or potentially micro leaks of hemorrhage from the aneurysm. This fluid and/or inflammation overlies the left psoas muscle and is in the region of the left ureter, likely the cause of the left flank pain. There is no large amount of retroperitoneal hemorrhage to suggest gross rupture.  The iliac arteries are heavily calcified but normal in caliber.  No free fluid in the pelvis.  The kidneys are normal in size and contour. Negative for renal stones. Both ureters are normal in caliber. Negative for ureteral stones. The urinary bladder is decompressed and unremarkable.  Noncontrast appearance of the liver, gallbladder, spleen, adrenal glands, and pancreas is within normal limits. Bowel loops are nondistended. Appendix is normal. Normal prostate gland.  Lumbar and lower thoracic spine vertebral bodies are normal in height and alignment. Degenerative disc disease at L5-S1.  IMPRESSION: 1. Large infrarenal abdominal aortic aneurysm, measures up to 9.3 x 10.4 x 16 cm (AP by transverse by sagittal length). Given medial displacement of intimal calcifications, intramural hematoma is not excluded. Small amounts of retroperitoneal stranding and fluid along the posterior and left lateral aspect of  the aneurysm as described above could reflect inflammation associated with the aneurysm or small micro leaks of blood. Critical Value/emergent results were called by telephone at the time of interpretation on 05/17/2014 at 11:56 am to Dr. Lajean Saver , who verbally acknowledged these results. 2. Negative for urinary tract stone disease or obstruction.   Electronically Signed   By: Curlene Dolphin M.D.   On: 05/17/2014 12:06     MDM  Iv ns. Dilaudid iv. zofran iv.  Ct.  Reviewed nursing notes and prior charts for additional history.   Radiologist called w above ct report - vascular surgery on call, Dr Bridgett Larsson,  immediately paged, discussed pt - he requests transfer to [facility], CTA abd/pelvis on arrival to [facility], and he will see in ED and plan for emergent OR.  2nd iv line. Additional labs.  [facility] called.  Also discussed w EDP Jacubowitz and charge rn at [facility] - pt to need type and cross and CTA upon arrival to [facility]  Recheck pt, pain much improved. Discussed ct. Pt agreeable w emergent transfer to [facility].   CRITICAL CARE  Severe flank pain, 10 cm AAA Performed by: Mirna Mires Total critical care time: 35 Critical care time was exclusive of separately billable procedures and treating other patients. Critical care was necessary to treat or prevent imminent or life-threatening deterioration. Critical care was time spent personally by me on the following activities: development of treatment plan with patient and/or surrogate as well as nursing, discussions with consultants, evaluation of patient's response to treatment, examination of patient, obtaining history from patient or surrogate, ordering and performing treatments and interventions, ordering and review of laboratory studies, ordering and review of radiographic studies, pulse oximetry and re-evaluation of patient's condition.      Mirna Mires, MD 05/17/14 1235

## 2014-05-17 NOTE — ED Provider Notes (Signed)
Transferred from [facility] long emergency department for CT angiogram and evaluation by vascular surgeon. Patient complains of low back pain for 3 days. Evaluated at [facility] emergency Department this morning. Transferred here for further evaluation after noncontrast CT scan of abdomen and pelvis showed large aortic aneurysm. CT angiogram ordered at request of the vascular surgeon. On exam patient is in no distress lungs clear auscultation heart regular rate and rhythm abdomen nondistended, nontender.  Date: 05/17/2014  Rate: 75  Rhythm: normal sinus rhythm  QRS Axis: normal  Intervals: normal  ST/T Wave abnormalities: normal  Conduction Disutrbances: none  Narrative Interpretation: unremarkable   Dr. Bridgett Larsson evaluated patient and arranged for admission to intensive care unit  Orlie Dakin, MD 05/17/14 1443

## 2014-05-17 NOTE — ED Notes (Signed)
MD at bedside. 

## 2014-05-17 NOTE — ED Notes (Addendum)
Lab called to draw type and screen.

## 2014-05-17 NOTE — ED Notes (Signed)
Pt presents to department from [facility] via [facility] for further evaluation of abdominal aneurysm. Pt states L sided flank pain x1 day. 10/10 pain upon arrival. Pt is alert and oriented x4.

## 2014-05-17 NOTE — ED Notes (Addendum)
Pt states pain to both sides of back. Ongoing x1 day. 9/10 at the time. Pt states pain becomes worse with movement. Pt is alert and oriented x4. Respirations unlabored.

## 2014-05-17 NOTE — ED Notes (Signed)
Surgeon at bedside, pt to be admitted.

## 2014-05-18 ENCOUNTER — Encounter (HOSPITAL_COMMUNITY): Payer: Self-pay | Admitting: Anesthesiology

## 2014-05-18 ENCOUNTER — Inpatient Hospital Stay (HOSPITAL_COMMUNITY): Payer: Medicare Other

## 2014-05-18 ENCOUNTER — Encounter (HOSPITAL_COMMUNITY): Payer: Medicare Other | Admitting: Anesthesiology

## 2014-05-18 ENCOUNTER — Encounter (HOSPITAL_COMMUNITY)
Admission: EM | Disposition: A | Discharge status: Home / Self Care | Payer: Self-pay | Source: Home / Self Care | Attending: Vascular Surgery

## 2014-05-18 ENCOUNTER — Inpatient Hospital Stay (HOSPITAL_COMMUNITY): Payer: Medicare Other | Admitting: Anesthesiology

## 2014-05-18 HISTORY — PX: ABDOMINAL AORTIC ENDOVASCULAR STENT GRAFT: SHX5707

## 2014-05-18 LAB — CBC
HCT: 44.2 % (ref 39.0–52.0)
HCT: 42.3 % (ref 39.0–52.0)
Hemoglobin: 15 g/dL (ref 13.0–17.0)
Hemoglobin: 14.2 g/dL (ref 13.0–17.0)
MCH: 33.3 pg (ref 26.0–34.0)
MCH: 32.8 pg (ref 26.0–34.0)
MCHC: 33.9 g/dL (ref 30.0–36.0)
MCHC: 33.6 g/dL (ref 30.0–36.0)
MCV: 98.2 fL (ref 78.0–100.0)
MCV: 97.7 fL (ref 78.0–100.0)
PLATELETS: 197 10*3/uL (ref 150–400)
PLATELETS: 181 10*3/uL (ref 150–400)
RBC: 4.5 MIL/uL (ref 4.22–5.81)
RBC: 4.33 MIL/uL (ref 4.22–5.81)
RDW: 13.2 % (ref 11.5–15.5)
RDW: 13.1 % (ref 11.5–15.5)
WBC: 11.9 10*3/uL — AB (ref 4.0–10.5)
WBC: 11 10*3/uL — ABNORMAL HIGH (ref 4.0–10.5)

## 2014-05-18 LAB — BASIC METABOLIC PANEL
ANION GAP: 14 (ref 5–15)
ANION GAP: 11 (ref 5–15)
BUN: 16 mg/dL (ref 6–23)
BUN: 16 mg/dL (ref 6–23)
CALCIUM: 8.1 mg/dL — AB (ref 8.4–10.5)
CHLORIDE: 103 meq/L (ref 96–112)
CO2: 25 mEq/L (ref 19–32)
CO2: 27 mEq/L (ref 19–32)
CREATININE: 1.13 mg/dL (ref 0.50–1.35)
Calcium: 8 mg/dL — ABNORMAL LOW (ref 8.4–10.5)
Chloride: 101 mEq/L (ref 96–112)
Creatinine, Ser: 1.06 mg/dL (ref 0.50–1.35)
GFR calc non Af Amer: 68 mL/min — ABNORMAL LOW (ref >90)
GFR, EST AFRICAN AMERICAN: 79 mL/min — AB (ref >90)
GFR, EST AFRICAN AMERICAN: 85 mL/min — AB (ref >90)
GFR, EST NON AFRICAN AMERICAN: 73 mL/min — AB (ref >90)
Glucose, Bld: 99 mg/dL (ref 70–99)
Glucose, Bld: 111 mg/dL — ABNORMAL HIGH (ref 70–99)
Potassium: 4.3 mEq/L (ref 3.7–5.3)
Potassium: 4.5 mEq/L (ref 3.7–5.3)
SODIUM: 140 meq/L (ref 137–147)
Sodium: 141 mEq/L (ref 137–147)

## 2014-05-18 LAB — PROTIME-INR
INR: 1.22 (ref 0.00–1.49)
PROTHROMBIN TIME: 15.4 s — AB (ref 11.6–15.2)

## 2014-05-18 LAB — PREPARE RBC (CROSSMATCH)

## 2014-05-18 LAB — APTT: aPTT: 38 seconds — ABNORMAL HIGH (ref 24–37)

## 2014-05-18 LAB — MAGNESIUM: Magnesium: 2 mg/dL (ref 1.5–2.5)

## 2014-05-18 SURGERY — INSERTION, ENDOVASCULAR STENT GRAFT, AORTA, ABDOMINAL
Anesthesia: General | Site: Abdomen

## 2014-05-18 MED ORDER — SODIUM CHLORIDE 0.9 % IR SOLN
Status: DC | PRN
  Administered 2014-05-18: 09:00:00

## 2014-05-18 MED ORDER — IODIXANOL 320 MG/ML IV SOLN
INTRAVENOUS | Status: DC | PRN
  Administered 2014-05-18: 100 mL via INTRAVENOUS
  Administered 2014-05-18: 41 mL via INTRAVENOUS

## 2014-05-18 MED ORDER — LORAZEPAM 1 MG PO TABS
1.0000 mg | ORAL_TABLET | Freq: Four times a day (QID) | ORAL | Status: DC
  Administered 2014-05-18 – 2014-05-20 (×7): 1 mg via ORAL
  Filled 2014-05-18 (×7): qty 1

## 2014-05-18 MED ORDER — MIDAZOLAM HCL 2 MG/2ML IJ SOLN
INTRAMUSCULAR | Status: AC
  Filled 2014-05-18: qty 2

## 2014-05-18 MED ORDER — HEPARIN SODIUM (PORCINE) 1000 UNIT/ML IJ SOLN
INTRAMUSCULAR | Status: DC | PRN
  Administered 2014-05-18: 9000 [IU] via INTRAVENOUS
  Administered 2014-05-18: 2000 [IU] via INTRAVENOUS

## 2014-05-18 MED ORDER — HEPARIN SODIUM (PORCINE) 1000 UNIT/ML IJ SOLN
INTRAMUSCULAR | Status: AC
  Filled 2014-05-18: qty 1

## 2014-05-18 MED ORDER — POTASSIUM CHLORIDE CRYS ER 20 MEQ PO TBCR: 20.0000 meq | EXTENDED_RELEASE_TABLET | Freq: Every day | ORAL | Status: DC | PRN

## 2014-05-18 MED ORDER — SODIUM CHLORIDE 0.9 % IV SOLN: 500.0000 mL | Freq: Once | INTRAVENOUS | Status: AC | PRN

## 2014-05-18 MED ORDER — SODIUM CHLORIDE 0.9 % IV SOLN
INTRAVENOUS | Status: DC
  Administered 2014-05-18: 15:00:00 via INTRAVENOUS

## 2014-05-18 MED ORDER — ARTIFICIAL TEARS OP OINT
TOPICAL_OINTMENT | OPHTHALMIC | Status: DC | PRN
  Administered 2014-05-18: 1 via OPHTHALMIC

## 2014-05-18 MED ORDER — CEFUROXIME SODIUM 1.5 G IJ SOLR
1.5000 g | Freq: Two times a day (BID) | INTRAMUSCULAR | Status: AC
  Administered 2014-05-18 – 2014-05-19 (×2): 1.5 g via INTRAVENOUS
  Filled 2014-05-18 (×2): qty 1.5

## 2014-05-18 MED ORDER — NEOSTIGMINE METHYLSULFATE 10 MG/10ML IV SOLN
INTRAVENOUS | Status: DC | PRN
  Administered 2014-05-18: 4 mg via INTRAVENOUS

## 2014-05-18 MED ORDER — SODIUM CHLORIDE 0.9 % IV SOLN
10.0000 mg | INTRAVENOUS | Status: DC | PRN
  Administered 2014-05-18: 20 ug/min via INTRAVENOUS

## 2014-05-18 MED ORDER — 0.9 % SODIUM CHLORIDE (POUR BTL) OPTIME
TOPICAL | Status: DC | PRN
  Administered 2014-05-18: 1000 mL

## 2014-05-18 MED ORDER — LIDOCAINE HCL (CARDIAC) 20 MG/ML IV SOLN
INTRAVENOUS | Status: AC
  Filled 2014-05-18: qty 10

## 2014-05-18 MED ORDER — MAGNESIUM SULFATE 40 MG/ML IJ SOLN
2.0000 g | Freq: Every day | INTRAMUSCULAR | Status: DC | PRN
  Filled 2014-05-18: qty 50

## 2014-05-18 MED ORDER — FENTANYL CITRATE 0.05 MG/ML IJ SOLN
INTRAMUSCULAR | Status: AC
  Filled 2014-05-18: qty 5

## 2014-05-18 MED ORDER — ONDANSETRON HCL 4 MG/2ML IJ SOLN
INTRAMUSCULAR | Status: AC
  Filled 2014-05-18: qty 2

## 2014-05-18 MED ORDER — PROPOFOL 10 MG/ML IV BOLUS
INTRAVENOUS | Status: DC | PRN
  Administered 2014-05-18: 100 mg via INTRAVENOUS

## 2014-05-18 MED ORDER — CETYLPYRIDINIUM CHLORIDE 0.05 % MT LIQD
7.0000 mL | Freq: Two times a day (BID) | OROMUCOSAL | Status: DC
  Administered 2014-05-18 – 2014-05-21 (×5): 7 mL via OROMUCOSAL

## 2014-05-18 MED ORDER — SUCCINYLCHOLINE CHLORIDE 20 MG/ML IJ SOLN
INTRAMUSCULAR | Status: DC | PRN
  Administered 2014-05-18: 120 mg via INTRAVENOUS

## 2014-05-18 MED ORDER — LIDOCAINE HCL (CARDIAC) 20 MG/ML IV SOLN
INTRAVENOUS | Status: DC | PRN
  Administered 2014-05-18: 100 mg via INTRAVENOUS

## 2014-05-18 MED ORDER — DOPAMINE-DEXTROSE 3.2-5 MG/ML-% IV SOLN: 3.0000 ug/kg/min | INTRAVENOUS | Status: DC

## 2014-05-18 MED ORDER — FLEET ENEMA 7-19 GM/118ML RE ENEM
1.0000 | ENEMA | Freq: Once | RECTAL | Status: AC | PRN
  Filled 2014-05-18: qty 1

## 2014-05-18 MED ORDER — LACTATED RINGERS IV SOLN
INTRAVENOUS | Status: DC | PRN
  Administered 2014-05-18: 09:00:00 via INTRAVENOUS

## 2014-05-18 MED ORDER — GLYCOPYRROLATE 0.2 MG/ML IJ SOLN
INTRAMUSCULAR | Status: DC | PRN
  Administered 2014-05-18: 0.2 mg via INTRAVENOUS
  Administered 2014-05-18: .8 mg via INTRAVENOUS

## 2014-05-18 MED ORDER — ROCURONIUM BROMIDE 100 MG/10ML IV SOLN
INTRAVENOUS | Status: DC | PRN
  Administered 2014-05-18: 30 mg via INTRAVENOUS
  Administered 2014-05-18: 20 mg via INTRAVENOUS

## 2014-05-18 MED ORDER — PROTAMINE SULFATE 10 MG/ML IV SOLN
INTRAVENOUS | Status: DC | PRN
  Administered 2014-05-18: 10 mg via INTRAVENOUS
  Administered 2014-05-18 (×2): 20 mg via INTRAVENOUS

## 2014-05-18 MED ORDER — LACTATED RINGERS IV SOLN
INTRAVENOUS | Status: DC | PRN
  Administered 2014-05-18 (×2): via INTRAVENOUS

## 2014-05-18 MED ORDER — FENTANYL CITRATE 0.05 MG/ML IJ SOLN
INTRAMUSCULAR | Status: DC | PRN
  Administered 2014-05-18: 200 ug via INTRAVENOUS
  Administered 2014-05-18: 50 ug via INTRAVENOUS

## 2014-05-18 MED ORDER — OXYCODONE-ACETAMINOPHEN 5-325 MG PO TABS: 1.0000 | ORAL_TABLET | ORAL | Status: DC | PRN

## 2014-05-18 SURGICAL SUPPLY — 81 items
BAG BANDED W/RUBBER/TAPE 36X54 (MISCELLANEOUS) ×3 IMPLANT
BAG SNAP BAND KOVER 36X36 (MISCELLANEOUS) ×3 IMPLANT
CANISTER SUCTION 2500CC (MISCELLANEOUS) ×3 IMPLANT
CATH BEACON 5.038 65CM KMP-01 (CATHETERS) IMPLANT
CATH HEADHUNTER 5FR 65CM (MISCELLANEOUS) ×3 IMPLANT
CATH OMNI FLUSH .035X70CM (CATHETERS) ×3 IMPLANT
CATH VANSCH 5FR 6CM (CATHETERS) ×3 IMPLANT
CLIP TI MEDIUM 24 (CLIP) IMPLANT
CLIP TI WIDE RED SMALL 24 (CLIP) IMPLANT
COVER DOME SNAP 22 D (MISCELLANEOUS) ×3 IMPLANT
COVER MAYO STAND STRL (DRAPES) ×3 IMPLANT
COVER PROBE W GEL 5X96 (DRAPES) ×3 IMPLANT
COVER SURGICAL LIGHT HANDLE (MISCELLANEOUS) ×3 IMPLANT
DERMABOND ADVANCED (GAUZE/BANDAGES/DRESSINGS) ×4
DERMABOND ADVANCED .7 DNX12 (GAUZE/BANDAGES/DRESSINGS) ×2 IMPLANT
DEVICE CLOSURE PERCLS PRGLD 6F (VASCULAR PRODUCTS) ×6 IMPLANT
DEVICE TORQUE KENDALL .025-038 (MISCELLANEOUS) ×3 IMPLANT
DRAPE TABLE COVER HEAVY DUTY (DRAPES) ×3 IMPLANT
DRSG TEGADERM 2-3/8X2-3/4 SM (GAUZE/BANDAGES/DRESSINGS) ×6 IMPLANT
DRSG TEGADERM 4X4.75 (GAUZE/BANDAGES/DRESSINGS) ×6 IMPLANT
DRYSEAL FLEXSHEATH 12FR 33CM (SHEATH) ×2
DRYSEAL FLEXSHEATH 18FR 33CM (SHEATH) ×2
ELECT CAUTERY BLADE 6.4 (BLADE) IMPLANT
ELECT REM PT RETURN 9FT ADLT (ELECTROSURGICAL) ×6
ELECTRODE REM PT RTRN 9FT ADLT (ELECTROSURGICAL) ×2 IMPLANT
EXCLUDER TNK 28X14.5MMX12CM (Endovascular Graft) ×1 IMPLANT
EXCLUDER TRUNK 28X14.5MMX12CM (Endovascular Graft) ×3 IMPLANT
GAUZE SPONGE 2X2 8PLY STRL LF (GAUZE/BANDAGES/DRESSINGS) ×2 IMPLANT
GLOVE BIO SURGEON STRL SZ7 (GLOVE) ×6 IMPLANT
GLOVE BIO SURGEON STRL SZ7.5 (GLOVE) ×6 IMPLANT
GLOVE BIOGEL PI IND STRL 7.5 (GLOVE) ×1 IMPLANT
GLOVE BIOGEL PI IND STRL 8 (GLOVE) ×1 IMPLANT
GLOVE BIOGEL PI INDICATOR 7.5 (GLOVE) ×2
GLOVE BIOGEL PI INDICATOR 8 (GLOVE) ×2
GLOVE SURG SS PI 7.0 STRL IVOR (GLOVE) ×6 IMPLANT
GOWN STRL REUS W/ TWL LRG LVL3 (GOWN DISPOSABLE) ×2 IMPLANT
GOWN STRL REUS W/TWL LRG LVL3 (GOWN DISPOSABLE) ×4
GRAFT BALLN CATH 65CM (STENTS) ×1 IMPLANT
GRAFT EXCLUDER AORTIC 28X3.3CM (Endovascular Graft) ×3 IMPLANT
GUIDEWIRE ANGLED .035X150CM (WIRE) ×3 IMPLANT
KIT BASIN OR (CUSTOM PROCEDURE TRAY) ×3 IMPLANT
KIT ROOM TURNOVER OR (KITS) ×3 IMPLANT
LEG CONTRALATERAL 16X16X13.5 (Endovascular Graft) ×2 IMPLANT
LEG CONTRALATERAL 16X16X9.5 (Endovascular Graft) ×3 IMPLANT
LEG CONTRALATERAL 16X20X13.5 (Vascular Products) ×2 IMPLANT
LEG CONTRALATERAL 20X11.5 (Vascular Products) ×2 IMPLANT
NEEDLE PERC 18GX7CM (NEEDLE) ×3 IMPLANT
NS IRRIG 1000ML POUR BTL (IV SOLUTION) ×3 IMPLANT
PACK AORTA (CUSTOM PROCEDURE TRAY) ×3 IMPLANT
PAD ARMBOARD 7.5X6 YLW CONV (MISCELLANEOUS) ×6 IMPLANT
PENCIL BUTTON HOLSTER BLD 10FT (ELECTRODE) IMPLANT
PERCLOSE PROGLIDE 6F (VASCULAR PRODUCTS) ×18
PROTECTION STATION PRESSURIZED (MISCELLANEOUS) ×3
SHEATH AVANTI 11CM 8FR (MISCELLANEOUS) ×3 IMPLANT
SHEATH BRITE TIP 8FR 23CM (MISCELLANEOUS) ×3 IMPLANT
SHEATH DRYSEAL FLEX 12FR 33CM (SHEATH) ×1 IMPLANT
SHEATH DRYSEAL FLEX 18FR 33CM (SHEATH) ×1 IMPLANT
SPONGE GAUZE 2X2 STER 10/PKG (GAUZE/BANDAGES/DRESSINGS) ×4
STAPLER VISISTAT 35W (STAPLE) IMPLANT
STATION PROTECTION PRESSURIZED (MISCELLANEOUS) ×1 IMPLANT
STENT GRAFT BALLN CATH 65CM (STENTS) ×2
STENT GRAFT CONTRALAT 16X13.5 (Endovascular Graft) ×1 IMPLANT
STENT GRAFT CONTRALAT 20X11.5 (Vascular Products) ×1 IMPLANT
STENT GRAFT CONTRALAT 20X13.5 (Vascular Products) ×1 IMPLANT
STOPCOCK MORSE 400PSI 3WAY (MISCELLANEOUS) ×3 IMPLANT
SUT ETHILON 3 0 PS 1 (SUTURE) IMPLANT
SUT MNCRL AB 4-0 PS2 18 (SUTURE) ×6 IMPLANT
SUT PROLENE 5 0 C 1 24 (SUTURE) ×3 IMPLANT
SUT VIC AB 2-0 CTX 36 (SUTURE) IMPLANT
SUT VIC AB 3-0 SH 27 (SUTURE)
SUT VIC AB 3-0 SH 27X BRD (SUTURE) IMPLANT
SYR 20CC LL (SYRINGE) ×6 IMPLANT
SYR 30ML LL (SYRINGE) IMPLANT
SYR MEDRAD MARK V 150ML (SYRINGE) ×3 IMPLANT
SYRINGE 10CC LL (SYRINGE) ×9 IMPLANT
TOWEL OR 17X24 6PK STRL BLUE (TOWEL DISPOSABLE) ×6 IMPLANT
TOWEL OR 17X26 10 PK STRL BLUE (TOWEL DISPOSABLE) ×6 IMPLANT
TRAY FOLEY CATH 16FRSI W/METER (SET/KITS/TRAYS/PACK) ×3 IMPLANT
TUBING HIGH PRESSURE 120CM (CONNECTOR) ×3 IMPLANT
WIRE AMPLATZ SS-J .035X180CM (WIRE) ×6 IMPLANT
WIRE BENTSON .035X145CM (WIRE) ×6 IMPLANT

## 2014-05-18 NOTE — Transfer of Care (Signed)
Immediate Anesthesia Transfer of Care Note  Patient: Steven Hayden  Procedure(s) Performed: Procedure(s): ABDOMINAL AORTIC ENDOVASCULAR STENT GRAFT (N/A)  Patient Location: PACU  Anesthesia Type:General  Level of Consciousness: sedated  Airway & Oxygen Therapy: Patient Spontanous Breathing and Patient connected to face mask oxygen  Post-op Assessment: Report given to PACU RN, Post -op Vital signs reviewed and stable and Patient moving all extremities X 4  Post vital signs: Reviewed and stable  Complications: No apparent anesthesia complications

## 2014-05-18 NOTE — Progress Notes (Signed)
Called Dr.Buice for sign out  

## 2014-05-18 NOTE — Interval H&P Note (Signed)
[facility] [place]  History and Physical Update  The patient was interviewed and re-examined.  The patient's previous History and Physical has been reviewed and is unchanged except for: interval softening of his BP.  His abd exam remains not consistent with a rupture.  Due to vacillating BP and concern for impending rupture, his EVAR is moved up to this AM.  There is no change in the plan of care: EVAR.  The patient is aware the risks of endovascular aortic surgery include but are not limited to: bleeding, need for transfusion, infection, death, stroke, paralysis, wound complications, bowel ischemia, extended ventilation, anaphylactic reaction to contrast, contrast induced nephropathy, embolism, and need for additional procedure to address endoleaks.  Overall, I cited a mortality rate of 1-2% and morbidity rate of 15%.    Adele Barthel, MD [facility] [place] Office: [phone] Pager: [phone]  05/18/2014, 8:31 AM

## 2014-05-18 NOTE — Op Note (Signed)
OPERATIVE NOTE   PROCEDURE:  1. Bilateral common femoral artery cannulation under ultrasound guidance 2. "Preclose" repair of bilateral common femoral artery  3. Placement of catheter in aorta x 2 4. Aortogram 5. Repair of aorta with modular bifurcated prosthesis with one limb (28 mm x 14 mm x 12 cm) 6. Placement of left iliac limb (16 mm x 13.5 cm) 7. Extension of left iliac limb (20 mm x 11.5 cm) 8. Extension of right iliac limb x 2 (16 mm x 9.5 cm, 20 mm x 13.5 cm) 9. Radiology S&I  PRE-OPERATIVE DIAGNOSIS: large abdominal aortic aneurysm  POST-OPERATIVE DIAGNOSIS: same as above   CO-SURGEONS: Adele Barthel, MD; Gae Gallop, MD   ANESTHESIA: general   ESTIMATED BLOOD LOSS: 100 cc   CONTRAST: 140 cc   FINDING(S):  1. Small left Type II endoleak 2. Successful exclusion of abdominal aortic aneurysm   INDICATIONS:  Steven Hayden is a 63 y.o. male  who presents with large abdominal aortic aneurysm, >10 cm.  He also had severe back pain, which I did not think was due to the large AAA.  However, overnight his blood pressure began to drop and concern for possible contained rupture was raised.  Subsequently, I elected to repair his endovascularly today.  The patient is aware the risks of endovascular aortic surgery include but are not limited to: bleeding, need for transfusion, infection, death, stroke, paralysis, wound complications, impotence, bowel ischemia, extended ventilation and need for secondary procedures. Overall, I cited a EVAR mortality rate of 1-2% and morbidity rate of 15%.   DESCRIPTION:  After full informed written consent was obtained from the patient, he was brought back to the operating room, amd placed supine upon the operating table. He already had a A-line place. After obtaining adequate anesthesia, a Foley catheter was placed to monitor urine output in the operating room. He was prepped and draped in the standard fashion for either open aneurysm repair or  endovascular aneurysm repair.   I first turned my attention to the right groin. Under ultrasound guidance, I identified the common femoral artery and made a stab incision over it. I dissected down to the artery in the groin with a hemostat and then cannulated it with a 18-gauge needle. I then passed a Benson wire up into the iliac system. I dilated the skin tract with a 8-Fr dilator with great difficulty due to his obesity.  I did not feel the tract was adequately dilated for Proglide placement, so I place an endhole catheter over the wire and exchanged it in the aorta for an Amplatz wire.  The catheter was removed.  The first Proglide was loaded over the Amplatz wire and deployed and rotated medially. The Proglide sutures were removed and tagged and the Amplatz wire replaced in the Proglide device. The Proglide was exchanged for a new ProGlide, which was deployed and rotated laterally. In such fashion, I completed the Pre-close technique on the right side and then replaced the Amplatz wire up into the aorta. Initially, a short 8-French sheath was loaded over the wire.   At this point, Dr. Scot Dock turned his attention to the left groin. In a similar fashion, he completed the "Pre-close" technique on the left common femoral artery.  He placed a long 8-Fr sheath on this side. With some difficulty, I eventually was able to get into the suprarenal aorta with a Vancheis catheter and Glidewire.  I exchanged the wire for an Amplatz and then exchanged the right sheath  for a 18-Fr Dryseal shath.  The dilator was removed.  On the left side, Dr. Scot Dock used a Benson wire and KMP catheter to get into the supra renal aorta.  The wire was exchanged for an Amplatz wire.  The left sheath was exchanged for a 12-Fr Dryseal sheath.  The dilator was removed.  The pigtail catheter was loaded over the Amplatz wire into the suprarenal position. The wire was removed and the catheter connected to the power injector circuit.   The  patient was given 9000 units of Heparin intravenously, which was a therapeutic bolus.  In total, 11000 units of Heparin was administrated to achieve and maintain a therapeutic level of anticoagulation.  A power injector aortogram via the pigtail catheter was completed to determine the position of the renal arteries.  I delivered the main body device, 28 mm x 14-mm x 142 cm, below the level of the right renal artery, which was the lower renal artery. I deployed the main body device.  I appeared that the graft had dropped a few millimeter on the right side.  At this point, Dr. Scot Dock  put an Amplatz wire back up the pigtail catheter in order to complete a buddy wire technique for cannulation. He placed an additional Benson wire and KMP through the left Dryseal sheath. He  exchanged the wire for a Glidewire. Unfortunately multiple wire and catheter combinations were tried.  Finally, I was able to cannulate the contralateral gate with a H1 catheter and glidewire.  I advanced both the catheter and wire through the graft. The catheter was exchanged for a pigtail catheter and was then pulled down into the graft.  I was able to rotate the pigtail, verifying intragraft position. I then pulled the catheter down to the flow divider. The left sheath was pulled until distal to the left internal iliac artery. A hand injection verified the position of the left internal iliac artery. Based on the images, no single limb was going to be long enough.  The plan was to place a 16 mm x 13.5 cm bridging limb then place a bell bottom graft, 20 mm x 11.5 cm.  The Amplatz wire was replaced.  Dr. Scot Dock loaded the 16 mm x 13.5 cm limb over the left Amplatz wire into the main body via the contralateral gate.  The limb was deployed with adequate overlap.  The stent delivery system was released and removed.  He then loaded the 20 mm x 11.5 cm bell bottom limb extension over the left Amplatz wire and deployed proximal to the left internal  iliac artery.  At this point, I fully deployed the main body, fully extending the partially constrained limb and also releasing the device from the delivery system. The stent delivery system was then removed.   The pigtail catheter was replaced on the right wire.  At this point, I pulled back the right sheath and did a hand injection to verify the right internal iliac artery position. Based on the images, a bridging iliac extension limb, 16 mm x 9.5 cm was going to be needed, along with a 20 mm x 13.5 mm bell bottom limb extension.  The bridging piece, 16 mm x 9.5 cm was loaded over the wire and deployed, with adequate overlap.  The stent delivery system was removed.  I loaded the bell bottom limb extension, 20 mm x 13.5 cm.  I deployed this piece proximal to the right internal iliac artery.    Dr. Scot Dock then obtained a  Q50 molding balloon which he used to mold the top of the graft and entire contralateral iliac limb. The balloon was removed. I placed the balloon over the left wire and molded the overlap points and the ipsilateral iliac limb. The balloon was deflated and removed. The pigtail catheter was placed in the suprarenal position over the left wire and the wire removed. The catheter was connected to the power injector circuit. Completion aortogram demonstrated a rapid Type I endoleak.  I was felt this might be a posterior leak.  The completion aortogram was repeated in nearly lateral position.  I marked the position of the of the two renal arteries.  The completion aortogram demonstrated lost of posterior wall coverage due to the severe angulation in this aortic neck.  At this point, the plan was placement of an aortic cuff, 28 mm x 3.3 cm via the right femoral sheath.  At this point, a Britta Mccreedy wire was placed through the pigtail on the left and the catheter removed.  The wire was pulled down distally in the endograft.  I placed the 28 mm x 3.3 cm aortic cuff over the right wire and deployed it nearly  at the level of the marked renal arteries.  I reloaded the pigtail catheter over the left wire and an aortogram was completed which demonstrated resolution of the Type I endoleak.  I pulled the pigtail catheter distally and molded the cuff with the Q50 balloon.  I replaced the pigtail catheter over the left wire.  The wire was removed and the catheter connected to the power injector.  The completion aortogram demonstrated resolution of Type I endoleak and small Type II endoleak on left side of graft.  There was no filling of the aortic sac visualized.  At this point, Dr. Scot Dock placed the [facility] in the pigtail catheter and removed the catheter. I placed this pigtail over the right Amplatz and exchanged it for a [employer] wire.  The right sheath was removed and pressure held. The left common femoral artery was repaired by cinching down the ProGlide sutures in two stages. In the first stage, the sutures were tightened with the wire left in place after removing the sheath. There was minimal blood loss after the first stage, with the wire in place. The wire was then removed from the left groin and the sutures re-cinched and the white tightening stitches were pulled to fully deploy the Proglide devices. All four sutures were held in place with a hemostat with tension on the skin. In a similar fashion, I repaired the right common femoral artery. The patient was given 50 mg of Protamine to fully reverse anticoagulation. After waiting a few minutes, there was no further significant bleeding from either groin cannulation site so the sutures were transected with suture transection devices. Each groin was repaired with a U stitch of 4-0 Vicryl.   Both feet appeared well perfused.    SPECIMEN(S): none   COMPLICATIONS: none   CONDITION: stable   Adele Barthel, MD [facility] [place] Office: [phone] Pager: [phone]  05/18/2014, 12:43 PM

## 2014-05-18 NOTE — Progress Notes (Signed)
   Interval Progress Note  Pt comfortable with pain rx.  Pt given one dose of Labetolol and BP soft since then.  Abd exam benign and NOT consistent with ruptured.  - Depend on review of the CTA by device rep, may move him to first case  Adele Barthel, MD [facility] [place] Office: [phone] Pager: [phone]  05/18/2014, 4:39 AM

## 2014-05-18 NOTE — Anesthesia Procedure Notes (Signed)
Procedure Name: Intubation Date/Time: 05/18/2014 9:37 AM Performed by: Neldon Newport Pre-anesthesia Checklist: Patient identified, Emergency Drugs available, Suction available, Patient being monitored and Timeout performed Patient Re-evaluated:Patient Re-evaluated prior to inductionOxygen Delivery Method: Circle system utilized Preoxygenation: Pre-oxygenation with 100% oxygen Intubation Type: IV induction Ventilation: Mask ventilation without difficulty Laryngoscope Size: Miller and 2 Grade View: Grade I Tube type: Oral Tube size: 8.0 mm Number of attempts: 1 Placement Confirmation: breath sounds checked- equal and bilateral,  positive ETCO2 and ETT inserted through vocal cords under direct vision Secured at: 24 cm Tube secured with: Tape Dental Injury: Teeth and Oropharynx as per pre-operative assessment

## 2014-05-18 NOTE — Progress Notes (Signed)
Patient c/o increased abdominal and back pain and feeling "dizzy."  BP 86/46.  Dr. Bridgett Larsson notified and plans to take patient to OR this AM for AAA repair.  No new orders obtained. Will continue to monitor.

## 2014-05-18 NOTE — Anesthesia Preprocedure Evaluation (Addendum)
Anesthesia Evaluation  Patient identified by MRN, date of birth, ID band Patient awake    Reviewed: Allergy & Precautions, H&P , Patient's Chart, lab work & pertinent test results  Airway Mallampati: I TM Distance: >3 FB Neck ROM: full    Dental  (+) Edentulous Upper, Edentulous Lower, Upper Dentures, Lower Dentures, Dental Advidsory Given   Pulmonary COPDCurrent Smoker,          Cardiovascular hypertension, + Peripheral Vascular Disease     Neuro/Psych    GI/Hepatic   Endo/Other    Renal/GU      Musculoskeletal   Abdominal   Peds  (+) ATTENTION DEFICIT DISORDER WITHOUT HYPERACTIVITY Hematology   Anesthesia Other Findings Currently wearing contact lenses.  Told me to take them out after asleep.  Reproductive/Obstetrics                          Anesthesia Physical Anesthesia Plan  ASA: IV and emergent  Anesthesia Plan: General   Post-op Pain Management:    Induction: Intravenous  Airway Management Planned: Oral ETT  Additional Equipment:   Intra-op Plan:   Post-operative Plan: Possible Post-op intubation/ventilation  Informed Consent: I have reviewed the patients History and Physical, chart, labs and discussed the procedure including the risks, benefits and alternatives for the proposed anesthesia with the patient or authorized representative who has indicated his/her understanding and acceptance.   Dental Advisory Given  Plan Discussed with: CRNA, Anesthesiologist and Surgeon  Anesthesia Plan Comments:        Anesthesia Quick Evaluation

## 2014-05-18 NOTE — Anesthesia Postprocedure Evaluation (Signed)
  Anesthesia Post-op Note  Patient: Steven Hayden  Procedure(s) Performed: Procedure(s): ABDOMINAL AORTIC ENDOVASCULAR STENT GRAFT (N/A)  Patient Location: PACU  Anesthesia Type:General  Level of Consciousness: awake, alert , oriented and patient cooperative  Airway and Oxygen Therapy: Patient Spontanous Breathing  Post-op Pain: mild  Post-op Assessment: Post-op Vital signs reviewed, Patient's Cardiovascular Status Stable, Respiratory Function Stable, Patent Airway, No signs of Nausea or vomiting and Pain level controlled  Post-op Vital Signs: stable  Last Vitals:  Filed Vitals:   05/18/14 1340  BP:   Pulse:   Temp: 36.2 C  Resp:     Complications: No apparent anesthesia complications

## 2014-05-19 LAB — CBC
HCT: 39.1 % (ref 39.0–52.0)
Hemoglobin: 12.9 g/dL — ABNORMAL LOW (ref 13.0–17.0)
MCH: 32.5 pg (ref 26.0–34.0)
MCHC: 33 g/dL (ref 30.0–36.0)
MCV: 98.5 fL (ref 78.0–100.0)
Platelets: 191 10*3/uL (ref 150–400)
RBC: 3.97 MIL/uL — ABNORMAL LOW (ref 4.22–5.81)
RDW: 13 % (ref 11.5–15.5)
WBC: 9 10*3/uL (ref 4.0–10.5)

## 2014-05-19 LAB — BASIC METABOLIC PANEL
ANION GAP: 9 (ref 5–15)
BUN: 13 mg/dL (ref 6–23)
CO2: 29 mEq/L (ref 19–32)
CREATININE: 1.02 mg/dL (ref 0.50–1.35)
Calcium: 8 mg/dL — ABNORMAL LOW (ref 8.4–10.5)
Chloride: 104 mEq/L (ref 96–112)
GFR calc Af Amer: 89 mL/min — ABNORMAL LOW (ref >90)
GFR calc non Af Amer: 77 mL/min — ABNORMAL LOW (ref >90)
Glucose, Bld: 109 mg/dL — ABNORMAL HIGH (ref 70–99)
Potassium: 4.3 mEq/L (ref 3.7–5.3)
SODIUM: 142 meq/L (ref 137–147)

## 2014-05-19 MED ORDER — SPIRITUS FRUMENTI
1.0000 | Freq: Three times a day (TID) | ORAL | Status: DC
  Administered 2014-05-19: 1 via ORAL
  Filled 2014-05-19 (×11): qty 1

## 2014-05-19 MED ORDER — SPIRITUS FRUMENTI
1.0000 | Freq: Three times a day (TID) | ORAL | Status: DC
  Filled 2014-05-19 (×5): qty 1

## 2014-05-19 MED ORDER — LORAZEPAM 2 MG/ML IJ SOLN
1.0000 mg | Freq: Four times a day (QID) | INTRAMUSCULAR | Status: DC | PRN
  Administered 2014-05-19: 2 mg via INTRAVENOUS
  Filled 2014-05-19 (×2): qty 1

## 2014-05-19 MED ORDER — NICOTINE 21 MG/24HR TD PT24
21.0000 mg | MEDICATED_PATCH | Freq: Every day | TRANSDERMAL | Status: DC
Start: 2014-05-19 — End: 2014-05-21
  Administered 2014-05-19 – 2014-05-21 (×3): 21 mg via TRANSDERMAL
  Filled 2014-05-19 (×3): qty 1

## 2014-05-19 NOTE — Progress Notes (Addendum)
   Daily Progress Note  Assessment/Planning: POD #1 s/p EVAR   Overnight, nursing notes some signs of possible EtOH withdrawal  Pt already getting schedule Ativan  Will add a beer to his diet  If pt can be weaned off oxygen, can be d/c home  IS x 10 q1 hr  OOB  Ok to transfer to floor if withdrawal sx improve  Subjective  - 1 Day Post-Op  No events overnight, though some withdrawal sx noted by overnight nursing, back pain gone  Objective Filed Vitals:   05/19/14 0400 05/19/14 0500 05/19/14 0600 05/19/14 0700  BP: 88/50 108/75 102/64 113/66  Pulse: 85 70 103 79  Temp: 98.4 F (36.9 C)     TempSrc: Oral     Resp: 23 13 15 17   Weight:   263 lb 14.3 oz (119.7 kg)   SpO2: 93% 90% 92% 94%    Intake/Output Summary (Last 24 hours) at 05/19/14 0729 Last data filed at 05/19/14 0700  Gross per 24 hour  Intake 5855.42 ml  Output   2050 ml  Net 3805.42 ml    PULM  BLL rales CV  RRR GI  soft, NTND, enormous pannus, no pulsatile AAA VASC  B groin soft without hematoma, echymosis in L, palpable B DP  Laboratory CBC    Component Value Date/Time   WBC 9.0 05/19/2014 0259   HGB 12.9* 05/19/2014 0259   HCT 39.1 05/19/2014 0259   PLT 191 05/19/2014 0259    BMET    Component Value Date/Time   NA 142 05/19/2014 0259   K 4.3 05/19/2014 0259   CL 104 05/19/2014 0259   CO2 29 05/19/2014 0259   GLUCOSE 109* 05/19/2014 0259   BUN 13 05/19/2014 0259   CREATININE 1.02 05/19/2014 0259   CALCIUM 8.0* 05/19/2014 0259   GFRNONAA 77* 05/19/2014 0259   GFRAA 89* 05/19/2014 Ramireno, MD [facility] [place]: [phone] Pager: [phone]  05/19/2014, 7:29 AM    Addendum  I have independently interviewed and examined the patient, and I agree with the physician assistant's findings.  Pt is starting to demonstrate EtOH withdrawal sx, so will keep in unit on withdrawal protocol.  Adele Barthel, MD [facility]  [place] Office: [phone] Pager: [phone]  05/19/2014, 10:15 AM

## 2014-05-19 NOTE — Progress Notes (Signed)
Patient is experiencing frequent PACs with what appears to small (3-6 beats) runs of SVT (per monitor), patient has been sleeping, denies CP/SOB, states his HA is better.  Paged Dr. Oneida Alar about patient's HR, will try to obtain EKG while patient experiences ectopy.  Will monitor per MD  ALL other VS are stable, patient is restrained per MD orders and patient is safe, will monitor

## 2014-05-19 NOTE — Progress Notes (Signed)
Patient appears to have some sleep apnea, 2.5 L Oxygen started via [place], will monitor

## 2014-05-19 NOTE — Telephone Encounter (Signed)
Pt in the hospital, mailed rx to his home address

## 2014-05-19 NOTE — Progress Notes (Signed)
Dr. Bridgett Larsson paged and made aware of increased agitation and trying to get up on his own despite prior safety measures such as chair alarm. New orders for restraints. Also ordered a nicotine patch due to smoking history. Patient is now in bed with mitts and bed alarm on. Will continue to monitor.  Steven Hayden

## 2014-05-19 NOTE — Op Note (Signed)
    NAME: Steven Hayden   MRN: [id number] DOB: [birth year]    DATE OF OPERATION: 05/19/2014  PREOP DIAGNOSIS: Abdominal aortic aneurysm  POSTOP DIAGNOSIS: Abdominal aortic aneurysm  PROCEDURE: Percutaneous endovascular aneurysm repair  CO-SURGEONS: Judeth Cornfield. Scot Dock, MD, Adele Barthel, MD  ANESTHESIA: Gen.   EBL: refer to the anesthesia record  INDICATIONS: Steven Hayden is a 62 y.o. male who presented with back pain. He was found to have a large abdominal aortic aneurysm and underwent semi urgent repair.  TECHNIQUE: The patient was taken to the operating room and received a general anesthetic. The abdomen and groins were prepped and draped in the usual sterile fashion. On the left side, the common femoral artery was verified with the ultrasound scanner. Under ultrasound guidance the left common femoral artery was cannulated and a guidewire introduced into the infrarenal aorta under fluoroscopic control. The tract was dilated with an 8 [place] dilator. The initial Perclose device was rotated medially and deployed without difficulty. This was then removed over a wire and a second Perclose device was placed and rotated laterally. The suture broke on the to next devices but on the third device the Perclose deployed without difficulty. A long 8 French sheath was then placed on the left side.  After the 18 French sheath was placed on the right side which also been Perclose as dictated by Dr. Bridgett Larsson, a 64 French sheath was placed on the  Left side over the Amplatz wire which had been placed. A pigtail catheter was positioned just above the level of the renal arteries. After the trunk ipsilateral component had been positioned at approximately the level of the renal arteries, a arteriogram was obtained to visualize level of the renal arteries. The trunk ipsilateral component was deployed as dictated by Dr. Bridgett Larsson. The contralateral gate was positioned on the patient's right side anteriorly. This was a  large aneurysm and cannulation of the gate was somewhat challenging ultimately the gate was cannulated and to be sure that we were within the device, a pigtail catheter was positioned in the top of the graft and turned without difficulty. An Amplatz wire was then replaced. Next a retrograde left iliac arteriogram was obtained in RAO projection in order to demonstrate the position of the left hypogastric artery. The initial limb placed on the left side into the contralateral gate, was a 16 mm x 13.5 cm device. This was placed over the Amplatz wire a 3 cm of overlap proximally and deployed without difficulty. The second piece on the left was a 20 mm x 11.5 cm iliac limb which was positioned  Just above the takeoff of the left hypogastric artery. This was deployed without difficulty.  After the ipsilateral side had been completed, the proximal aorta and all junction sites in the distal graft were all successfully along with the Q 50 balloon.  The left groin was closed initially using the 2 previously placed Perclose devices. The sheath was removed and pressure held for hemostasis. Each side was successfully tightened until there was good hemostasis and then the wire was removed. It was then tightened again and secured. The sutures were cut and pressure held for hemostasis. There was good hemostasis.  The remainder of the dictation is as per Dr. Adele Barthel.  Steven Mayo, MD, FACS [facility]  DATE OF DICTATION:   05/19/2014

## 2014-05-19 NOTE — Progress Notes (Signed)
EKG Obtained   Per EKG, Sinus Rhythm with Sinus Arrhythmia.  Patient sleeping, denies CP/SOB/HA, VSS, will monitor

## 2014-05-19 NOTE — Progress Notes (Signed)
Utilization Review Completed.  

## 2014-05-20 LAB — CBC
HCT: 38.2 % — ABNORMAL LOW (ref 39.0–52.0)
HEMOGLOBIN: 13.1 g/dL (ref 13.0–17.0)
MCH: 32.7 pg (ref 26.0–34.0)
MCHC: 34.3 g/dL (ref 30.0–36.0)
MCV: 95.3 fL (ref 78.0–100.0)
Platelets: 191 10*3/uL (ref 150–400)
RBC: 4.01 MIL/uL — ABNORMAL LOW (ref 4.22–5.81)
RDW: 12.6 % (ref 11.5–15.5)
WBC: 8.3 10*3/uL (ref 4.0–10.5)

## 2014-05-20 LAB — TYPE AND SCREEN
ABO/RH(D): A POS
ANTIBODY SCREEN: NEGATIVE
UNIT DIVISION: 0
UNIT DIVISION: 0
UNIT DIVISION: 0
Unit division: 0

## 2014-05-20 LAB — HEPATIC FUNCTION PANEL
ALT: 15 U/L (ref 0–53)
AST: 13 U/L (ref 0–37)
Albumin: 2.7 g/dL — ABNORMAL LOW (ref 3.5–5.2)
Alkaline Phosphatase: 58 U/L (ref 39–117)
Bilirubin, Direct: <0.2 mg/dL (ref 0.0–0.3)
Total Bilirubin: 0.5 mg/dL (ref 0.3–1.2)
Total Protein: 6.4 g/dL (ref 6.0–8.3)

## 2014-05-20 LAB — BASIC METABOLIC PANEL
Anion gap: 10 (ref 5–15)
BUN: 9 mg/dL (ref 6–23)
CO2: 30 mEq/L (ref 19–32)
Calcium: 8.4 mg/dL (ref 8.4–10.5)
Chloride: 96 mEq/L (ref 96–112)
Creatinine, Ser: 0.8 mg/dL (ref 0.50–1.35)
Glucose, Bld: 105 mg/dL — ABNORMAL HIGH (ref 70–99)
Potassium: 3.5 mEq/L — ABNORMAL LOW (ref 3.7–5.3)
SODIUM: 136 meq/L — AB (ref 137–147)

## 2014-05-20 MED ORDER — LORAZEPAM 2 MG/ML IJ SOLN
INTRAMUSCULAR | Status: AC
  Filled 2014-05-20: qty 1

## 2014-05-20 MED ORDER — ALPRAZOLAM 0.5 MG PO TABS
0.5000 mg | ORAL_TABLET | Freq: Three times a day (TID) | ORAL | Status: DC | PRN
  Administered 2014-05-20 (×2): 0.5 mg via ORAL
  Filled 2014-05-20 (×2): qty 1

## 2014-05-20 MED ORDER — METHYLPHENIDATE HCL ER (OSM) 36 MG PO TBCR: 36.0000 mg | EXTENDED_RELEASE_TABLET | Freq: Every day | ORAL | Status: DC

## 2014-05-20 MED ORDER — METHYLPHENIDATE HCL ER (OSM) 18 MG PO TBCR
36.0000 mg | EXTENDED_RELEASE_TABLET | Freq: Every day | ORAL | Status: DC
Start: 2014-05-20 — End: 2014-05-21
  Administered 2014-05-20 – 2014-05-21 (×2): 36 mg via ORAL
  Filled 2014-05-20 (×2): qty 2

## 2014-05-20 MED ORDER — LORAZEPAM 2 MG/ML IJ SOLN: 1.0000 mg | INTRAMUSCULAR | Status: DC | PRN

## 2014-05-20 NOTE — Progress Notes (Signed)
    Daily Progress Note   Assessment/Planning: POD #2 s/p EVAR complicated by post-op delirium possibly due to EtOH withdraw, OSA, Hypertension   Confusion overnight requiring restraints  On Ativan PO and IV regimen for EtOH withdrawal, pt has been ok hemodynamically and mental status improved  Will hold Ativan and pain rx to see if delirium will resolve  Titrating down oxygen demand  Pt apneic at night time  Will add back his ADHD rx and add Xanax  Subjective  - 2 Days Post-Op  Combative yesterday and overnight leading to restraints  Objective Filed Vitals:   05/20/14 0500 05/20/14 0520 05/20/14 0600 05/20/14 0700  BP: 135/80  107/81 113/79  Pulse: 78 78 88 77  Temp:      TempSrc:      Resp: 16 19 20 17   Weight:      SpO2: 96% 96% 96% 89%    Intake/Output Summary (Last 24 hours) at 05/20/14 0824 Last data filed at 05/20/14 0100  Gross per 24 hour  Intake    220 ml  Output   1625 ml  Net  -1405 ml   GEN  combative PULM  BLL rales CV  RRR GI  soft, NTND VASC  Both feet with palpable DP  Laboratory CBC    Component Value Date/Time   WBC 8.3 05/20/2014 0226   HGB 13.1 05/20/2014 0226   HCT 38.2* 05/20/2014 0226   PLT 191 05/20/2014 0226    BMET    Component Value Date/Time   NA 136* 05/20/2014 0226   K 3.5* 05/20/2014 0226   CL 96 05/20/2014 0226   CO2 30 05/20/2014 0226   GLUCOSE 105* 05/20/2014 0226   BUN 9 05/20/2014 0226   CREATININE 0.80 05/20/2014 0226   CALCIUM 8.4 05/20/2014 0226   GFRNONAA >90 05/20/2014 0226   GFRAA >90 05/20/2014 0226    Adele Barthel, MD [facility] [place]: [phone] Pager: [phone]  05/20/2014, 8:24 AM

## 2014-05-20 NOTE — Progress Notes (Signed)
      Patient angry removed restraints sitting on side of bed.  PO ativan written.  I added IV ativan for nursing staff.    COLLINS, EMMA MAUREEN PA-C

## 2014-05-21 ENCOUNTER — Other Ambulatory Visit: Payer: Self-pay | Admitting: *Deleted

## 2014-05-21 ENCOUNTER — Encounter (HOSPITAL_COMMUNITY): Payer: Self-pay | Admitting: Vascular Surgery

## 2014-05-21 DIAGNOSIS — Z48812 Encounter for surgical aftercare following surgery on the circulatory system: Secondary | ICD-10-CM

## 2014-05-21 DIAGNOSIS — I714 Abdominal aortic aneurysm, without rupture, unspecified: Secondary | ICD-10-CM

## 2014-05-21 NOTE — Progress Notes (Signed)
   Daily Progress Note  Assessment/Planning: POD #3 s/p EVAR   Delirium resolved  Ok to D/C home  Subjective  - 3 Days Post-Op  Pt apologized for behavior yesterday, no events overnight  Objective Filed Vitals:   05/21/14 0600 05/21/14 0700 05/21/14 0733 05/21/14 0800  BP:  128/89  111/72  Pulse:      Temp:   98.4 F (36.9 C) 98.4 F (36.9 C)  TempSrc:   Oral Oral  Resp: 22 22  20   Weight:      SpO2:    92%    Intake/Output Summary (Last 24 hours) at 05/21/14 0838 Last data filed at 05/21/14 0740  Gross per 24 hour  Intake    480 ml  Output      3 ml  Net    477 ml   PULM  CTAB CV  RRR  GI  soft, NTND, enormous pannus, no pulsatile AAA  VASC  B groin soft without hematoma, echymosis in L, palpable B DP  Laboratory CBC    Component Value Date/Time   WBC 8.3 05/20/2014 0226   HGB 13.1 05/20/2014 0226   HCT 38.2* 05/20/2014 0226   PLT 191 05/20/2014 0226    BMET    Component Value Date/Time   NA 136* 05/20/2014 0226   K 3.5* 05/20/2014 0226   CL 96 05/20/2014 0226   CO2 30 05/20/2014 0226   GLUCOSE 105* 05/20/2014 0226   BUN 9 05/20/2014 0226   CREATININE 0.80 05/20/2014 0226   CALCIUM 8.4 05/20/2014 0226   GFRNONAA >90 05/20/2014 0226   GFRAA >90 05/20/2014 0226    Adele Barthel, MD [facility] [place]: [phone] Pager: [phone]  05/21/2014, 8:38 AM

## 2014-05-21 NOTE — Progress Notes (Signed)
Patient discharged home with his Dad/wife, discharge instructions given and explained to patient/family and they verbalized understanding, denies any pain/distress. Surgical incision clean/dry/intact. No other wound noted. Accompanied home by father/mother/ex-wife. Transported to the car by staff via wheelchair.

## 2014-05-21 NOTE — Care Management Note (Signed)
    Page 1 of 1   05/21/2014     3:05:05 PM CARE MANAGEMENT NOTE 05/21/2014  Patient:  Steven Hayden, Steven Hayden   Account Number:  [id number]  Date Initiated:  05/21/2014  Documentation initiated by:  Rishav Rockefeller  Subjective/Objective Assessment:   Pt s/p percutaneous endovascular aneurysm repair on 05/17/14.  PTA, pt indpendent, lives alone.  Present ETOH use.     Action/Plan:   Will follow for dc needs as pt progresses.   Anticipated DC Date:  05/21/2014   Anticipated DC Plan:  [place]  CM consult      Choice offered to / List presented to:             Status of service:  Completed, signed off Medicare Important Message given?  YES (If response is "NO", the following Medicare IM given date fields will be blank) Date Medicare IM given:  05/21/2014 Medicare IM given by:  Lolita Faulds Date Additional Medicare IM given:   Additional Medicare IM given by:    Discharge Disposition:  HOME/SELF CARE  Per UR Regulation:  Reviewed for med. necessity/level of care/duration of stay  If discussed at [place] of Stay Meetings, dates discussed:    Comments:

## 2014-05-22 ENCOUNTER — Telehealth: Payer: Self-pay | Admitting: Vascular Surgery

## 2014-05-22 NOTE — Telephone Encounter (Addendum)
Message copied by Doristine Section on Fri May 22, 2014  9:22 AM ------      Message from: [place], [place] K      Created: Thu May 21, 2014 12:49 PM      Regarding: Schedule                   ----- Message -----         From: Ulyses Amor, PA-C         Sent: 05/21/2014  12:46 PM           To: Vvs Charge Pool            EVAR f/u in 4 weeks CTA Abdomin and pelvis.  Dr. Bridgett Larsson thx ------  notified patient of post op appt. on 06-19-14,10:15 at [facility] for ct and then at 11:45 with dr. Bridgett Larsson

## 2014-05-29 ENCOUNTER — Ambulatory Visit (INDEPENDENT_AMBULATORY_CARE_PROVIDER_SITE_OTHER): Payer: Medicare Other | Admitting: Vascular Surgery

## 2014-05-29 ENCOUNTER — Encounter: Payer: Self-pay | Admitting: Vascular Surgery

## 2014-05-29 ENCOUNTER — Ambulatory Visit (INDEPENDENT_AMBULATORY_CARE_PROVIDER_SITE_OTHER)
Admission: RE | Admit: 2014-05-29 | Discharge: 2014-05-29 | Disposition: A | Discharge status: Home / Self Care | Payer: Medicare Other | Source: Ambulatory Visit | Attending: Vascular Surgery | Admitting: Vascular Surgery

## 2014-05-29 ENCOUNTER — Ambulatory Visit (HOSPITAL_COMMUNITY)
Admission: RE | Admit: 2014-05-29 | Discharge: 2014-05-29 | Disposition: A | Discharge status: Home / Self Care | Payer: Medicare Other | Source: Ambulatory Visit | Attending: Vascular Surgery | Admitting: Vascular Surgery

## 2014-05-29 ENCOUNTER — Other Ambulatory Visit: Payer: Self-pay | Admitting: *Deleted

## 2014-05-29 VITALS — BP 129/71 | HR 72 | Ht 71.0 in | Wt 255.0 lb

## 2014-05-29 DIAGNOSIS — M7918 Myalgia, other site: Secondary | ICD-10-CM

## 2014-05-29 DIAGNOSIS — I714 Abdominal aortic aneurysm, without rupture, unspecified: Secondary | ICD-10-CM

## 2014-05-29 DIAGNOSIS — Z48812 Encounter for surgical aftercare following surgery on the circulatory system: Secondary | ICD-10-CM

## 2014-05-29 DIAGNOSIS — I10 Essential (primary) hypertension: Secondary | ICD-10-CM | POA: Insufficient documentation

## 2014-05-29 DIAGNOSIS — IMO0001 Reserved for inherently not codable concepts without codable children: Secondary | ICD-10-CM

## 2014-05-29 DIAGNOSIS — F17200 Nicotine dependence, unspecified, uncomplicated: Secondary | ICD-10-CM | POA: Insufficient documentation

## 2014-05-29 DIAGNOSIS — E785 Hyperlipidemia, unspecified: Secondary | ICD-10-CM | POA: Diagnosis not present

## 2014-05-29 DIAGNOSIS — I739 Peripheral vascular disease, unspecified: Secondary | ICD-10-CM | POA: Diagnosis not present

## 2014-05-29 NOTE — Progress Notes (Signed)
    Post-operative EVAR   History of Present Illness  Steven Hayden is a 63 y.o. male who presents post-op s/p EVAR (Date: 05/19/14).  Pt c/o L thigh pain and buttock pain since the procedure.  He denies any intermittent claudication or color change in his feet.  Pain has been responsive to "Icy-hot" rub to L thigh  For VQI Use Only  PRE-ADM LIVING: Home  AMB STATUS: Ambulatory  Physical Examination  Filed Vitals:   05/29/14 1338  BP: 129/71  Pulse: 72    Vascular: palpable DP bilateral  Gastrointestinal: soft, NTND, -G/R, - HSM, - masses, - CVAT B, no pulsatile pass  Non-Invasive Vascular Imaging  ABI (Date: 05/29/2014)  R: 1.14, DP: tri, PT: tri, TBI: 0.82  L: 1.12, DP: tri, PT: tri, TBI: 0.87  Aortoiliac duplex (05/29/2014)  Poor status due gas and body habitus  Patent aorta  Unseen R CIA  Patent L CIA  Medical Decision Making  Steven Hayden is a 63 y.o. male who presents s/p EVAR, L leg pain of likely musculoskeletal origin   No evidence of limb dysfunction.  The patient's sx and response to conservative measures is more consistent with a musculoskeletal etiology.  We discussed use of a heating compress, stretches, and NSAIDs to help with his sx mgmt while it heals.  He has his post-EVAR follow up appointment already scheduled.  He will follow up with [place] at that time.  Thank you for allowing [place] to participate in this patient's care.  Adele Barthel, MD [facility] [place] Office: [phone] Pager: [phone]  05/29/2014, 2:10 PM

## 2014-06-01 ENCOUNTER — Encounter: Payer: Self-pay | Admitting: Physician Assistant

## 2014-06-01 ENCOUNTER — Ambulatory Visit (INDEPENDENT_AMBULATORY_CARE_PROVIDER_SITE_OTHER): Payer: Medicare Other | Admitting: Physician Assistant

## 2014-06-01 VITALS — BP 130/84 | HR 74 | Temp 98.2°F | Resp 18 | Wt 261.2 lb

## 2014-06-01 DIAGNOSIS — M62838 Other muscle spasm: Secondary | ICD-10-CM | POA: Diagnosis not present

## 2014-06-01 MED ORDER — CYCLOBENZAPRINE HCL 5 MG PO TABS: 5.0000 mg | ORAL_TABLET | Freq: Every day | ORAL | Status: DC

## 2014-06-01 NOTE — Progress Notes (Signed)
Subjective:    Patient ID: Steven Hayden, male    DOB: [birth year], 63 y.o.   MRN: [id number]  HPI Patient is a 63 y.o. male presenting for leg pain. This started 1 week ago. The pt had an aortic stent 2 weeks ago. Pt states that when he woke up from anesthesia, he was unfortunately combative, and had to be restrained. After being released, he began experiencing the leg pain. He went to see his surgeon on Friday, and is surgeon ruled out any problems with the stent, and believed that it was most likely musculoskeletal. He states that the pain is 10/10, tight pain in the left leg only, starts in the gluteal area, involves his lateral thigh to his knee, and some of his posterior thigh muscles. He states that it does not involve his back or anything below the knee. He states that warm water seems to help the pain. It is aggravated by sitting or laying on that side. It is better if he is standing or walking. He also tried putting some IcyHot on the area, which provided mild relief. Pt denies any radiating pain, numbness, tingling, or weakness. Patient denies fevers, chills, nausea, vomiting, diarrhea, shortness of breath, chest pain, headache, syncope.    Review of Systems As per HPI and are otherwise negative.   Past Medical History  Diagnosis Date  . Hyperlipidemia   . Hypertension   . ADD (attention deficit disorder)   . Tobacco abuse   . COPD (chronic obstructive pulmonary disease)   . Atrial fibrillation     History   Social History  . Marital Status: Divorced    Spouse Name: N/A    Number of Children: 1  . Years of Education: N/A   Occupational History  . Not on file.   Social History Main Topics  . Smoking status: Current Every Day Smoker -- 1.00 packs/day for 50 years    Types: Cigarettes  . Smokeless tobacco: Not on file  . Alcohol Use: 12.0 oz/week    20 Shots of liquor per week  . Drug Use: No  . Sexual Activity: Not on file   Other Topics Concern  . Not on file    Social History Narrative  . No narrative on file    Past Surgical History  Procedure Laterality Date  . Knee surgery      bilateral  . Abdominal aortic endovascular stent graft N/A 05/18/2014    Procedure: ABDOMINAL AORTIC ENDOVASCULAR STENT GRAFT;  Surgeon: Conrad Carteret, MD;  Location: [facility] OR;  Service: Vascular;  Laterality: N/A;    Family History  Problem Relation Age of Onset  . Hypertension Brother   . Hyperlipidemia Brother   . Hypertension Father   . Hyperlipidemia Father   . Cancer Father     Allergies  Allergen Reactions  . Codeine Phosphate     REACTION: swelling of uvula, hives    Current Outpatient Prescriptions on File Prior to Visit  Medication Sig Dispense Refill  . atorvastatin (LIPITOR) 40 MG tablet Take 40 mg by mouth daily.      Marland Kitchen lisinopril-hydrochlorothiazide (PRINZIDE,ZESTORETIC) 20-25 MG per tablet TAKE TWO TABLETS BY MOUTH DAILY   60 tablet  2  . methylphenidate (CONCERTA) 36 MG PO CR tablet Take 1 tablet (36 mg total) by mouth every morning.  30 tablet  0  . tiotropium (SPIRIVA HANDIHALER) 18 MCG inhalation capsule Place 1 capsule (18 mcg total) into inhaler and inhale daily.  90 capsule  3   No current facility-administered medications on file prior to visit.    EXAM: BP 130/84  Pulse 74  Temp(Src) 98.2 F (36.8 C) (Oral)  Resp 18  Wt 261 lb 3.2 oz (118.48 kg)  SpO2 96%     Objective:   Physical Exam  Nursing note and vitals reviewed. Constitutional: He is oriented to person, place, and time. He appears well-developed and well-nourished. No distress.  HENT:  Head: Normocephalic and atraumatic.  Eyes: Conjunctivae and EOM are normal. Pupils are equal, round, and reactive to light.  Neck: Normal range of motion.  Cardiovascular: Normal rate, regular rhythm and intact distal pulses.   Pulmonary/Chest: Effort normal and breath sounds normal. No respiratory distress. He exhibits no tenderness.  Musculoskeletal: Normal range of motion. He  exhibits tenderness. He exhibits no edema.  Mild ttp of the left gluteal region, left posterior lateral musculature. Gait normal. SLR normal.  Neurological: He is alert and oriented to person, place, and time.  Strength, reflexes, and sensation grossly intact bilaterally.  Skin: Skin is warm and dry. No rash noted. He is not diaphoretic. No erythema. No pallor.  Visible surgical wounds on bilateral femoral region and groin. Both appear to be healing well. There is no surrounding erythema, no tenderness to palpation, no excessive warmth, no fluctuance.  Psychiatric: He has a normal mood and affect. His behavior is normal. Judgment and thought content normal.     Lab Results  Component Value Date   WBC 8.3 05/20/2014   HGB 13.1 05/20/2014   HCT 38.2* 05/20/2014   PLT 191 05/20/2014   GLUCOSE 105* 05/20/2014   CHOL 236* 12/12/2011   TRIG 71.0 12/12/2011   HDL 37.60* 12/12/2011   LDLDIRECT 191.5 12/12/2011   LDLCALC 114* 07/14/2008   ALT 15 05/20/2014   AST 13 05/20/2014   NA 136* 05/20/2014   K 3.5* 05/20/2014   CL 96 05/20/2014   CREATININE 0.80 05/20/2014   BUN 9 05/20/2014   CO2 30 05/20/2014   TSH 0.63 06/07/2010   INR 1.22 05/18/2014        Assessment & Plan:  Juanmiguel was seen today for left hamstring pain.  Diagnoses and associated orders for this visit:  Muscle spasm Comments: Trial of flexeril at bedtime. Tylenol regimen. Watchful waiting. - cyclobenzaprine (FLEXERIL) 5 MG tablet; Take 1 tablet (5 mg total) by mouth at bedtime.   Pt requested evaluation of surgical wounds to verify proper healing, and upon inspection they do appear to be healing appropriately. Pt has f/u schedule with surgeon, as well as appointment to establish with new PCP and is encouraged to keep this appointments.  Return precautions provided, and patient handout on Muscle spasm.  Plan to follow up as needed, or for worsening or persistent symptoms despite treatment.  Patient Instructions  Flexeril taken at  night to help relieve muscle spasm.  Start a tylenol regimen. 500mg  tylenol arthritis strength.  Rest, push fluids. Alternate ice and heat.  If emergency symptoms discussed during visit developed, seek medical attention immediately.  Followup as needed, or for worsening or persistent symptoms despite treatment.

## 2014-06-01 NOTE — Progress Notes (Signed)
Pre visit review using our clinic review tool, if applicable. No additional management support is needed unless otherwise documented below in the visit note. 

## 2014-06-01 NOTE — Patient Instructions (Addendum)
Flexeril taken at night to help relieve muscle spasm.  Start a tylenol regimen. 500mg  tylenol arthritis strength.  Rest, push fluids. Alternate ice and heat.  If emergency symptoms discussed during visit developed, seek medical attention immediately.  Followup as needed, or for worsening or persistent symptoms despite treatment.   Muscle Cramps and Spasms Muscle cramps and spasms are when muscles tighten by themselves. They usually get better within minutes. Muscle cramps are painful. They are usually stronger and last longer than muscle spasms. Muscle spasms may or may not be painful. They can last a few seconds or much longer. HOME CARE  Drink enough fluid to keep your pee (urine) clear or pale yellow.  Massage, stretch, and relax the muscle.  Use a warm towel, heating pad, or warm shower water on tight muscles.  Place ice on the muscle if it is tender or in pain.  Put ice in a plastic bag.  Place a towel between your skin and the bag.  Leave the ice on for 15-20 minutes, 03-04 times a day.  Only take medicine as told by your doctor. GET HELP RIGHT AWAY IF:  Your cramps or spasms get worse, happen more often, or do not get better with time. MAKE SURE YOU:  Understand these instructions.  Will watch your condition.  Will get help right away if you are not doing well or get worse. Document Released: 08/03/2008 Document Revised: 12/16/2012 Document Reviewed: 08/07/2012 [facility] Patient Information 2015 [place], [place]. This information is not intended to replace advice given to you by your health care provider. Make sure you discuss any questions you have with your health care provider.

## 2014-06-02 NOTE — Discharge Summary (Signed)
[facility] Discharge Summary   Patient ID:  Steven Hayden MRN: [id number] DOB/AGE: [birth year] 63 y.o.  Admit date: 05/17/2014 Discharge date: 05/21/14 Date of Surgery: 05/17/2014 - 05/18/2014 Surgeon: Surgeon(s): Conrad [place], MD Elam Dutch, MD Angelia Mould, MD  Admission Diagnosis: AAA (abdominal aortic aneurysm) [441.4] Left flank pain [789.09]  Discharge Diagnoses:  AAA (abdominal aortic aneurysm) [441.4] Left flank pain [789.09]  Secondary Diagnoses: Past Medical History  Diagnosis Date  . Hyperlipidemia   . Hypertension   . ADD (attention deficit disorder)   . Tobacco abuse   . COPD (chronic obstructive pulmonary disease)   . Atrial fibrillation     Procedure(s): ABDOMINAL AORTIC ENDOVASCULAR STENT GRAFT  Discharged Condition: good  HPI: The patient is a 63 y.o. (1951-08-03) male who presents with chief complaint: back and hip pain. Onset ~1.5 days ago, decreased as severe back and bilateral hip pain with radiation down left leg. The patient notes prior history of kidney stone which had similar sx to this episode. The patient denies any embolic sx previously. Risk factors for AAA included: sex, age, and active smoking. There is no family history of aneurysmal disease.   He was seen in the ER once the CTA was reviewed and showed a large infrarenal AAA that remains intact.  10.3 cm markedly tortuous infrarenal abdominal aortic aneurysm.       Hospital Course:  DARYUS SOWASH is a 63 y.o. male is S/P  Procedure(s): ABDOMINAL AORTIC ENDOVASCULAR STENT GRAFT He was admitted to 3S for observation.  Overnight, nursing notes some signs of possible EtOH withdrawal.  He was getting ativan PRN.  Dr. Bridgett Larsson add a beer to his diet.  Keptunit on withdrawal protocol.  He was confused and combative over night POD#2 and was placed in restraints.  A sitter was ordered.  POD#3 delirium resolved.  Patient was discharged home in stable condition        Consults:  Treatment Team:  Conrad [place], MD  Significant Diagnostic Studies: CBC Lab Results  Component Value Date   WBC 8.3 05/20/2014   HGB 13.1 05/20/2014   HCT 38.2* 05/20/2014   MCV 95.3 05/20/2014   PLT 191 05/20/2014    BMET    Component Value Date/Time   NA 136* 05/20/2014 0226   K 3.5* 05/20/2014 0226   CL 96 05/20/2014 0226   CO2 30 05/20/2014 0226   GLUCOSE 105* 05/20/2014 0226   BUN 9 05/20/2014 0226   CREATININE 0.80 05/20/2014 0226   CALCIUM 8.4 05/20/2014 0226   GFRNONAA >90 05/20/2014 0226   GFRAA >90 05/20/2014 0226   COAG Lab Results  Component Value Date   INR 1.22 05/18/2014     Disposition:  Discharge to :Home Discharge Instructions   Call MD for:  redness, tenderness, or signs of infection (pain, swelling, bleeding, redness, odor or green/yellow discharge around incision site)    Complete by:  As directed      Call MD for:  severe or increased pain, loss or decreased feeling  in affected limb(s)    Complete by:  As directed      Call MD for:  temperature >100.5    Complete by:  As directed      Discharge instructions    Complete by:  As directed   You may shower daily     Driving Restrictions    Complete by:  As directed   No driving for 2 weeks  Lifting restrictions    Complete by:  As directed   No lifting for 12 weeks     Resume previous diet    Complete by:  As directed             Medication List         atorvastatin 40 MG tablet  Commonly known as:  LIPITOR  Take 40 mg by mouth daily.     lisinopril-hydrochlorothiazide 20-25 MG per tablet  Commonly known as:  PRINZIDE,ZESTORETIC  TAKE TWO TABLETS BY MOUTH DAILY     methylphenidate 36 MG CR tablet  Commonly known as:  CONCERTA  Take 1 tablet (36 mg total) by mouth every morning.     tiotropium 18 MCG inhalation capsule  Commonly known as:  SPIRIVA HANDIHALER  Place 1 capsule (18 mcg total) into inhaler and inhale daily.       Verbal and written Discharge  instructions given to the patient. Wound care per Discharge AVS     Follow-up Information   Follow up with Hinda Lenis, MD In 4 weeks. (sent message to office)    Specialty:  Vascular Surgery   Contact information:   [place] [address] [phone]       Signed: Laurence Slate [facility] 06/02/2014, 11:45 AM   Addendum  I have independently interviewed and examined the patient, and I agree with the physician assistant's discharge summary.  This patient presented with a sx AAA which was repaired on 05/18/14 with an EVAR.  Post-op he had confusion concerning for alcohol withdrawal so we kept in ICU on CIWA protocol.  It turned out this patient had narcotic/anesthesia related post-operative delirium which resolved by POD #3, leading to his discharge.  He will follow up in the office 4 weeks with a CTA.  Adele Barthel, MD [facility] [place] Office: [phone] Pager: [phone]  06/03/2014, 10:17 AM   - For VQI Registry use --- Instructions: Press F2 to tab through selections.  Delete question if not applicable.   Post-op:  Time to Extubation: [x ] In OR, [ ]  < 12 hrs, [ ]  12-24 hrs, [ ]  >=24 hrs Vasopressors Req. Post-op: No MI: [x ] No, [ ]  Troponin only, [ ]  EKG or Clinical New Arrhythmia: No CHF: No ICU Stay: 2 days Transfusion: No  If yes, 0 units given  Complications: Resp failure: [x ] none, [ ]  Pneumonia, [ ]  Ventilator Chg in renal function: [x ] none, [ ]  Inc. Cr > 0.5, [ ]  Temp. Dialysis, [ ]  Permanent dialysis Leg ischemia: [x ] No, [ ]  Yes, no Surgery needed, [ ]  Yes, Surgery needed, [ ]  Amputation Bowel ischemia: [x ] No, [ ]  Medical Rx, [ ]  Surgical Rx Wound complication: [x ] No, [ ]  Superficial separation/infection, [ ]  Return to OR Return to OR: No  Return to OR for bleeding: No Stroke: [x ] None, [ ]  Minor, [ ]  Major  Discharge medications: Statin use:  Yes ASA use:  No  for medical reason   Plavix use:   No  for medical reason   Beta blocker use:  No  for medical reason

## 2014-06-18 ENCOUNTER — Ambulatory Visit: Payer: Medicare Other | Admitting: Family

## 2014-06-18 ENCOUNTER — Encounter: Payer: Self-pay | Admitting: Vascular Surgery

## 2014-06-19 ENCOUNTER — Ambulatory Visit (INDEPENDENT_AMBULATORY_CARE_PROVIDER_SITE_OTHER): Payer: Self-pay | Admitting: Vascular Surgery

## 2014-06-19 ENCOUNTER — Encounter: Payer: Self-pay | Admitting: Vascular Surgery

## 2014-06-19 ENCOUNTER — Ambulatory Visit
Admission: RE | Admit: 2014-06-19 | Discharge: 2014-06-19 | Disposition: A | Discharge status: Home / Self Care | Payer: Medicare Other | Source: Ambulatory Visit | Attending: Vascular Surgery | Admitting: Vascular Surgery

## 2014-06-19 VITALS — BP 139/100 | HR 97 | Ht 71.0 in | Wt 254.0 lb

## 2014-06-19 DIAGNOSIS — Z48812 Encounter for surgical aftercare following surgery on the circulatory system: Secondary | ICD-10-CM

## 2014-06-19 DIAGNOSIS — I714 Abdominal aortic aneurysm, without rupture, unspecified: Secondary | ICD-10-CM

## 2014-06-19 MED ORDER — IOHEXOL 350 MG/ML SOLN
65.0000 mL | Freq: Once | INTRAVENOUS | Status: AC | PRN
  Administered 2014-06-19: 65 mL via INTRAVENOUS

## 2014-06-19 NOTE — Progress Notes (Signed)
    Post-operative EVAR   History of Present Illness  Steven Hayden is a 63 y.o. male who presents post-op s/p EVAR (Date: 05/19/14).  EVAR was completed urgently for a sx AAA.  The patient's back pain completely resolved.  Most recent CTA (Date: 06/19/2014) demonstrates: no type I endoleak, possible small type II endoleak and stable sac size.  The patient has no further back or abdominal pain.  His previous episode of thigh pain has resolved.   For VQI Use Only   PRE-ADM LIVING: Home  AMB STATUS: Ambulatory  Physical Examination  Filed Vitals:   06/19/14 1153  BP: 139/100  Pulse: 97    Vascular: Vessel Right Left  Aorta Non-palpable N/A  Femoral Palpable Palpable  Popliteal Non-palpable Non-palpable  PT Not  Palpable Not  Palpable  DP Faintly  Palpable Faintly Palpable   Gastrointestinal: soft, NTND, -G/R, - HSM, - masses, - CVAT B  Vascular: puncture sites healed  Non-Invasive Vascular Imaging  CTA Abd & pelvis (Date: 06/19/2014) Endovascular repair of the abdominal aortic aneurysm. The stent graft is patent and no evidence for an endoleak. Minimal enlargement of the aneurysm sac now measuring up to 10.5 cm and previously measured 10.3 cm.  Based on my review of this pt's CTA, his endograft is in good position.  There is no obvious type I endoleaks.  There is a significant amount of scatter which may account for what distal appears to be a small type II endoleak.  The sac size is not significantly different from the prior study.  There multiple fragments of calcification in the mural thrombus as previously.  I see no evidence of limb dysfunction.  Medical Decision Making  BERTEL VENARD is a 63 y.o. male who presents s/p EVAR.  Pt is asymptomatic with stable sac size.  I discussed with the patient the importance of surveillance of the endograft.  The next endograft duplex will be scheduled for 6 months.  The next CT will be scheduled for 12 months.  The  patient will follow up with [place] in 6 months with these studies.  Thank you for allowing [place] to participate in this patient's care.  Adele Barthel, MD [facility] [place] Office: [phone] Pager: [phone]  06/19/2014, 12:08 PM

## 2014-06-19 NOTE — Addendum Note (Signed)
Addended by: Mena Goes on: 06/19/2014 03:05 PM   Modules accepted: Orders

## 2014-06-22 ENCOUNTER — Encounter: Payer: Self-pay | Admitting: Family

## 2014-06-22 ENCOUNTER — Ambulatory Visit (INDEPENDENT_AMBULATORY_CARE_PROVIDER_SITE_OTHER): Payer: Medicare Other | Admitting: Family

## 2014-06-22 VITALS — BP 110/80 | HR 67 | Ht 71.0 in | Wt 256.0 lb

## 2014-06-22 DIAGNOSIS — Z23 Encounter for immunization: Secondary | ICD-10-CM | POA: Diagnosis not present

## 2014-06-22 DIAGNOSIS — F909 Attention-deficit hyperactivity disorder, unspecified type: Secondary | ICD-10-CM | POA: Diagnosis not present

## 2014-06-22 DIAGNOSIS — E7800 Pure hypercholesterolemia, unspecified: Secondary | ICD-10-CM

## 2014-06-22 DIAGNOSIS — I10 Essential (primary) hypertension: Secondary | ICD-10-CM

## 2014-06-22 DIAGNOSIS — Z720 Tobacco use: Secondary | ICD-10-CM | POA: Diagnosis not present

## 2014-06-22 DIAGNOSIS — G4700 Insomnia, unspecified: Secondary | ICD-10-CM

## 2014-06-22 DIAGNOSIS — I714 Abdominal aortic aneurysm, without rupture, unspecified: Secondary | ICD-10-CM

## 2014-06-22 DIAGNOSIS — F988 Other specified behavioral and emotional disorders with onset usually occurring in childhood and adolescence: Secondary | ICD-10-CM

## 2014-06-22 LAB — LIPID PANEL
CHOL/HDL RATIO: 7
Cholesterol: 225 mg/dL — ABNORMAL HIGH (ref 0–200)
HDL: 30.9 mg/dL — AB (ref >39.00)
LDL CALC: 174 mg/dL — AB (ref 0–99)
NONHDL: 194.1
Triglycerides: 102 mg/dL (ref 0.0–149.0)
VLDL: 20.4 mg/dL (ref 0.0–40.0)

## 2014-06-22 MED ORDER — TIOTROPIUM BROMIDE MONOHYDRATE 18 MCG IN CAPS: 18.0000 ug | ORAL_CAPSULE | Freq: Every day | RESPIRATORY_TRACT | Status: DC

## 2014-06-22 MED ORDER — BUPROPION HCL ER (XL) 150 MG PO TB24: 150.0000 mg | ORAL_TABLET | Freq: Every day | ORAL | Status: DC

## 2014-06-22 MED ORDER — METHYLPHENIDATE HCL ER (OSM) 36 MG PO TBCR: 36.0000 mg | EXTENDED_RELEASE_TABLET | ORAL | Status: DC

## 2014-06-22 MED ORDER — LISINOPRIL-HYDROCHLOROTHIAZIDE 20-25 MG PO TABS: ORAL_TABLET | ORAL | Status: DC

## 2014-06-22 MED ORDER — SIMVASTATIN 20 MG PO TABS: 20.0000 mg | ORAL_TABLET | Freq: Every day | ORAL | Status: DC

## 2014-06-22 NOTE — Progress Notes (Signed)
Pre visit review using our clinic review tool, if applicable. No additional management support is needed unless otherwise documented below in the visit note. 

## 2014-06-22 NOTE — Progress Notes (Signed)
Subjective:    Patient ID: Steven Hayden, male    DOB: Oct 01, 1950, 63 y.o.   MRN: [id number]  HPI 63 year old obese, white male, one half pack per day smoker is in today to be established. He has a history of hyperlipidemia and has not been taking cholesterol medication. Requesting a refill. History of hypertension, tobacco abuse, COPD, and was recently diagnosed with abdominal aortic aneurysm without rupture. Had stent placement done in September. Recovering well. Expresses wishes to quit smoking. Has tried Chantix in the past with adverse effects. He like to try something different.   Review of Systems  Constitutional: Negative.   HENT: Negative.   Respiratory: Negative.   Cardiovascular: Negative.   Gastrointestinal: Negative.   Endocrine: Negative.   Genitourinary: Negative.   Musculoskeletal: Negative.   Skin: Negative.   Allergic/Immunologic: Negative.   Neurological: Negative.   Hematological: Negative.   Psychiatric/Behavioral: Negative.    Past Medical History  Diagnosis Date  . Hyperlipidemia   . Hypertension   . ADD (attention deficit disorder)   . Tobacco abuse   . COPD (chronic obstructive pulmonary disease)   . Atrial fibrillation   . AAA (abdominal aortic aneurysm)     History   Social History  . Marital Status: Divorced    Spouse Name: N/A    Number of Children: 1  . Years of Education: N/A   Occupational History  . Not on file.   Social History Main Topics  . Smoking status: Current Every Day Smoker -- 1.00 packs/day for 50 years    Types: Cigarettes  . Smokeless tobacco: Not on file  . Alcohol Use: 12.0 oz/week    20 Shots of liquor per week  . Drug Use: No  . Sexual Activity: Not on file   Other Topics Concern  . Not on file   Social History Narrative  . No narrative on file    Past Surgical History  Procedure Laterality Date  . Knee surgery      bilateral  . Abdominal aortic endovascular stent graft N/A 05/18/2014    Procedure:  ABDOMINAL AORTIC ENDOVASCULAR STENT GRAFT;  Surgeon: Conrad Pineland, MD;  Location: [facility] OR;  Service: Vascular;  Laterality: N/A;  . Abdominal aortic aneurysm repair      Family History  Problem Relation Age of Onset  . Hypertension Brother   . Hyperlipidemia Brother   . Hypertension Father   . Hyperlipidemia Father   . Cancer Father     Allergies  Allergen Reactions  . Codeine Phosphate     REACTION: swelling of uvula, hives    Current Outpatient Prescriptions on File Prior to Visit  Medication Sig Dispense Refill  . cyclobenzaprine (FLEXERIL) 5 MG tablet Take 1 tablet (5 mg total) by mouth at bedtime.  15 tablet  1   No current facility-administered medications on file prior to visit.    BP 110/80  Pulse 67  Ht 5\' 11"  (1.803 m)  Wt 256 lb (116.121 kg)  BMI 35.72 kg/m2chart    Objective:   Physical Exam  Constitutional: He is oriented to person, place, and time. He appears well-developed and well-nourished.  HENT:  Right Ear: External ear normal.  Left Ear: External ear normal.  Nose: Nose normal.  Mouth/Throat: Oropharynx is clear and moist.  Neck: Normal range of motion. Neck supple.  Cardiovascular: Normal rate, regular rhythm and normal heart sounds.   Pulmonary/Chest: Effort normal and breath sounds normal.  Abdominal: Soft. Bowel sounds are normal.  There is no rebound and no guarding.  Musculoskeletal: Normal range of motion.  Neurological: He is alert and oriented to person, place, and time.  Skin: Skin is warm and dry.  Incision sites in the groin are well healed. No signs of infection.          Assessment & Plan:  [name] was seen today for establish care.  Diagnoses and associated orders for this visit:  ADD (attention deficit disorder)  Essential hypertension, benign  Tobacco abuse - Lipid Panel  Encounter for immunization  AAA (abdominal aortic aneurysm) without rupture  Pure hypercholesterolemia - Lipid Panel  Insomnia  Other  Orders - simvastatin (ZOCOR) 20 MG tablet; Take 1 tablet (20 mg total) by mouth at bedtime. - lisinopril-hydrochlorothiazide (PRINZIDE,ZESTORETIC) 20-25 MG per tablet; TAKE TWO TABLETS BY MOUTH DAILY - Discontinue: methylphenidate (CONCERTA) 36 MG PO CR tablet; Take 1 tablet (36 mg total) by mouth every morning. - tiotropium (SPIRIVA HANDIHALER) 18 MCG inhalation capsule; Place 1 capsule (18 mcg total) into inhaler and inhale daily. - buPROPion (WELLBUTRIN XL) 150 MG 24 hr tablet; Take 1 tablet (150 mg total) by mouth daily. - methylphenidate (CONCERTA) 36 MG PO CR tablet; Take 1 tablet (36 mg total) by mouth every morning.    call the office with any questions or concerns. Recheck for complete physical exam as scheduled and sooner as needed. Followup with specialist as scheduled. Stressed the importance of smoking cessation as well as compliance with cholesterol medication. Patient verbalizes understanding.

## 2014-06-22 NOTE — Patient Instructions (Addendum)
Abdominal Aortic Aneurysm An aneurysm is a weakened or damaged part of an artery wall that bulges from the normal force of blood pumping through the body. An abdominal aortic aneurysm is an aneurysm that occurs in the lower part of the aorta, the main artery of the body.  The major concern with an abdominal aortic aneurysm is that it can enlarge and burst (rupture) or blood can flow between the layers of the wall of the aorta through a tear (aorticdissection). Both of these conditions can cause bleeding inside the body and can be life threatening unless diagnosed and treated promptly. CAUSES  The exact cause of an abdominal aortic aneurysm is unknown. Some contributing factors are:   A hardening of the arteries caused by the buildup of fat and other substances in the lining of a blood vessel (arteriosclerosis).  Inflammation of the walls of an artery (arteritis).   Connective tissue diseases, such as Marfan syndrome.   Abdominal trauma.   An infection, such as syphilis or staphylococcus, in the wall of the aorta (infectious aortitis) caused by bacteria. RISK FACTORS  Risk factors that contribute to an abdominal aortic aneurysm may include:  Age older than 60 years.   High blood pressure (hypertension).  Male gender.  Ethnicity (white race).  Obesity.  Family history of aneurysm (first degree relatives only).  Tobacco use. PREVENTION  The following healthy lifestyle habits may help decrease your risk of abdominal aortic aneurysm:  Quitting smoking. Smoking can raise your blood pressure and cause arteriosclerosis.  Limiting or avoiding alcohol.  Keeping your blood pressure, blood sugar level, and cholesterol levels within normal limits.  Decreasing your salt intake. In somepeople, too much salt can raise blood pressure and increase your risk of abdominal aortic aneurysm.  Eating a diet low in saturated fats and cholesterol.  Increasing your fiber intake by including  whole grains, vegetables, and fruits in your diet. Eating these foods may help lower blood pressure.  Maintaining a healthy weight.  Staying physically active and exercising regularly. SYMPTOMS  The symptoms of abdominal aortic aneurysm may vary depending on the size and rate of growth of the aneurysm.Most grow slowly and do not have any symptoms. When symptoms do occur, they may include:  Pain (abdomen, side, lower back, or groin). The pain may vary in intensity. A sudden onset of severe pain may indicate that the aneurysm has ruptured.  Feeling full after eating only small amounts of food.  Nausea or vomiting or both.  Feeling a pulsating lump in the abdomen.  Feeling faint or passing out. DIAGNOSIS  Since most unruptured abdominal aortic aneurysms have no symptoms, they are often discovered during diagnostic exams for other conditions. An aneurysm may be found during the following procedures:  Ultrasonography (A one-time screening for abdominal aortic aneurysm by ultrasonography is also recommended for all men aged 65-75 years who have ever smoked).  X-ray exams.  A computed tomography (CT).  Magnetic resonance imaging (MRI).  Angiography or arteriography. TREATMENT  Treatment of an abdominal aortic aneurysm depends on the size of your aneurysm, your age, and risk factors for rupture. Medication to control blood pressure and pain may be used to manage aneurysms smaller than 6 cm. Regular monitoring for enlargement may be recommended by your caregiver if:  The aneurysm is 3-4 cm in size (an annual ultrasonography may be recommended).  The aneurysm is 4-4.5 cm in size (an ultrasonography every 6 months may be recommended).  The aneurysm is larger than 4.5 cm in   size (your caregiver may ask that you be examined by a vascular surgeon). If your aneurysm is larger than 6 cm, surgical repair may be recommended. There are two main methods for repair of an aneurysm:   Endovascular  repair (a minimally invasive surgery). This is done most often.  Open repair. This method is used if an endovascular repair is not possible. Document Released: 05/31/2005 Document Revised: 12/16/2012 Document Reviewed: 09/20/2012 ExitCare Patient Information 2015 [employer]. This information is not intended to replace advice given to you by your health care provider. Make sure you discuss any questions you have with your health care provider.   Smoking Cessation Quitting smoking is important to your health and has many advantages. However, it is not always easy to quit since nicotine is a very addictive drug. Oftentimes, people try 3 times or more before being able to quit. This document explains the best ways for you to prepare to quit smoking. Quitting takes hard work and a lot of effort, but you can do it. ADVANTAGES OF QUITTING SMOKING  You will live longer, feel better, and live better.  Your body will feel the impact of quitting smoking almost immediately.  Within 20 minutes, blood pressure decreases. Your pulse returns to its normal level.  After 8 hours, carbon monoxide levels in the blood return to normal. Your oxygen level increases.  After 24 hours, the chance of having a heart attack starts to decrease. Your breath, hair, and body stop smelling like smoke.  After 48 hours, damaged nerve endings begin to recover. Your sense of taste and smell improve.  After 72 hours, the body is virtually free of nicotine. Your bronchial tubes relax and breathing becomes easier.  After 2 to 12 weeks, lungs can hold more air. Exercise becomes easier and circulation improves.  The risk of having a heart attack, stroke, cancer, or lung disease is greatly reduced.  After 1 year, the risk of coronary heart disease is cut in half.  After 5 years, the risk of stroke falls to the same as a nonsmoker.  After 10 years, the risk of lung cancer is cut in half and the risk of other cancers  decreases significantly.  After 15 years, the risk of coronary heart disease drops, usually to the level of a nonsmoker.  If you are pregnant, quitting smoking will improve your chances of having a healthy baby.  The people you live with, especially any children, will be healthier.  You will have extra money to spend on things other than cigarettes. QUESTIONS TO THINK ABOUT BEFORE ATTEMPTING TO QUIT You may want to talk about your answers with your health care provider.  Why do you want to quit?  If you tried to quit in the past, what helped and what did not?  What will be the most difficult situations for you after you quit? How will you plan to handle them?  Who can help you through the tough times? Your family? Friends? A health care provider?  What pleasures do you get from smoking? What ways can you still get pleasure if you quit? Here are some questions to ask your health care provider:  How can you help me to be successful at quitting?  What medicine do you think would be best for me and how should I take it?  What should I do if I need more help?  What is smoking withdrawal like? How can I get information on withdrawal? GET READY  Set a quit   date.  Change your environment by getting rid of all cigarettes, ashtrays, matches, and lighters in your home, car, or work. Do not let people smoke in your home.  Review your past attempts to quit. Think about what worked and what did not. GET SUPPORT AND ENCOURAGEMENT You have a better chance of being successful if you have help. You can get support in many ways.  Tell your family, friends, and coworkers that you are going to quit and need their support. Ask them not to smoke around you.  Get individual, group, or telephone counseling and support. Programs are available at local hospitals and health centers. Call your local health department for information about programs in your area.  Spiritual beliefs and practices may  help some smokers quit.  Download a "quit meter" on your computer to keep track of quit statistics, such as how long you have gone without smoking, cigarettes not smoked, and money saved.  Get a self-help book about quitting smoking and staying off tobacco. LEARN NEW SKILLS AND BEHAVIORS  Distract yourself from urges to smoke. Talk to someone, go for a walk, or occupy your time with a task.  Change your normal routine. Take a different route to work. Drink tea instead of coffee. Eat breakfast in a different place.  Reduce your stress. Take a hot bath, exercise, or read a book.  Plan something enjoyable to do every day. Reward yourself for not smoking.  Explore interactive web-based programs that specialize in helping you quit. GET MEDICINE AND USE IT CORRECTLY Medicines can help you stop smoking and decrease the urge to smoke. Combining medicine with the above behavioral methods and support can greatly increase your chances of successfully quitting smoking.  Nicotine replacement therapy helps deliver nicotine to your body without the negative effects and risks of smoking. Nicotine replacement therapy includes nicotine gum, lozenges, inhalers, nasal sprays, and skin patches. Some may be available over-the-counter and others require a prescription.  Antidepressant medicine helps people abstain from smoking, but how this works is unknown. This medicine is available by prescription.  Nicotinic receptor partial agonist medicine simulates the effect of nicotine in your brain. This medicine is available by prescription. Ask your health care provider for advice about which medicines to use and how to use them based on your health history. Your health care provider will tell you what side effects to look out for if you choose to be on a medicine or therapy. Carefully read the information on the package. Do not use any other product containing nicotine while using a nicotine replacement product.    RELAPSE OR DIFFICULT SITUATIONS Most relapses occur within the first 3 months after quitting. Do not be discouraged if you start smoking again. Remember, most people try several times before finally quitting. You may have symptoms of withdrawal because your body is used to nicotine. You may crave cigarettes, be irritable, feel very hungry, cough often, get headaches, or have difficulty concentrating. The withdrawal symptoms are only temporary. They are strongest when you first quit, but they will go away within 10-14 days. To reduce the chances of relapse, try to:  Avoid drinking alcohol. Drinking lowers your chances of successfully quitting.  Reduce the amount of caffeine you consume. Once you quit smoking, the amount of caffeine in your body increases and can give you symptoms, such as a rapid heartbeat, sweating, and anxiety.  Avoid smokers because they can make you want to smoke.  Do not let weight gain distract you. Many   smokers will gain weight when they quit, usually less than 10 pounds. Eat a healthy diet and stay active. You can always lose the weight gained after you quit.  Find ways to improve your mood other than smoking. FOR MORE INFORMATION  www.smokefree.gov  Document Released: 08/15/2001 Document Revised: 01/05/2014 Document Reviewed: 11/30/2011 ExitCare Patient Information 2015 [employer]. This information is not intended to replace advice given to you by your health care provider. Make sure you discuss any questions you have with your health care provider.  

## 2014-06-23 ENCOUNTER — Telehealth: Payer: Self-pay | Admitting: Family

## 2014-06-23 NOTE — Telephone Encounter (Signed)
emmi emailed °

## 2014-07-14 ENCOUNTER — Telehealth: Payer: Self-pay | Admitting: Family

## 2014-07-14 NOTE — Telephone Encounter (Signed)
Form has been re-faxed and confirmation received.  Pt was to return in 6 weeks for a Lipid panel. I will place the order.  Abby Potash, is there something he can try while waiting for the spiriva from patient assistance?

## 2014-07-14 NOTE — Telephone Encounter (Signed)
Pt states the paperwork for his med assistance to [employer] was never received. Pt would like to know if you  can re-fax please.  [phone] Pt also states the spiriva is over $300.  Pt would like to know if there is something else he could try.  Pt states he was to return in 6 weeks for labs only.  The only labs order were from 09/2013. Are those the ones to order?

## 2014-07-15 NOTE — Telephone Encounter (Signed)
Stop and pick up a sample of something similar. I will put his name on it.

## 2014-07-15 NOTE — Telephone Encounter (Signed)
Pt aware dulera sample ready to pick up

## 2014-08-05 ENCOUNTER — Other Ambulatory Visit (INDEPENDENT_AMBULATORY_CARE_PROVIDER_SITE_OTHER): Payer: Medicare Other

## 2014-08-05 ENCOUNTER — Other Ambulatory Visit: Payer: Self-pay | Admitting: Family

## 2014-08-05 DIAGNOSIS — E785 Hyperlipidemia, unspecified: Secondary | ICD-10-CM | POA: Diagnosis not present

## 2014-08-05 LAB — LIPID PANEL
Cholesterol: 200 mg/dL (ref 0–200)
HDL: 32.5 mg/dL — AB (ref >39.00)
LDL Cholesterol: 144 mg/dL — ABNORMAL HIGH (ref 0–99)
NONHDL: 167.5
Total CHOL/HDL Ratio: 6
Triglycerides: 119 mg/dL (ref 0.0–149.0)
VLDL: 23.8 mg/dL (ref 0.0–40.0)

## 2014-08-05 MED ORDER — SIMVASTATIN 40 MG PO TABS: 40.0000 mg | ORAL_TABLET | Freq: Every day | ORAL | Status: DC

## 2014-08-18 ENCOUNTER — Telehealth: Payer: Self-pay | Admitting: Family

## 2014-08-18 MED ORDER — METHYLPHENIDATE HCL ER (OSM) 36 MG PO TBCR: 36.0000 mg | EXTENDED_RELEASE_TABLET | ORAL | Status: DC

## 2014-08-18 NOTE — Telephone Encounter (Signed)
Patient would like a re-fill on methylphenidate (CONCERTA) 36 MG PO CR tablet.  And he states he confused about the medication sent in for his cholesterol. He said it's the same thing he was already on but double the dosage.

## 2014-08-18 NOTE — Telephone Encounter (Signed)
Left message to advise pt Rx ready to pick up and to let him know that when we spoke about his labs, I discussed with him that Padonda wanted to increase the simvistatin. Advised pt to call back with questions or concerns

## 2014-11-13 ENCOUNTER — Ambulatory Visit: Payer: Medicare Other | Admitting: Family

## 2014-12-17 ENCOUNTER — Encounter: Payer: Self-pay | Admitting: Vascular Surgery

## 2014-12-18 ENCOUNTER — Ambulatory Visit (HOSPITAL_COMMUNITY)
Admission: RE | Admit: 2014-12-18 | Discharge: 2014-12-18 | Disposition: A | Discharge status: Home / Self Care | Payer: Medicare Other | Source: Ambulatory Visit | Attending: Vascular Surgery | Admitting: Vascular Surgery

## 2014-12-18 ENCOUNTER — Encounter: Payer: Self-pay | Admitting: Vascular Surgery

## 2014-12-18 ENCOUNTER — Ambulatory Visit (INDEPENDENT_AMBULATORY_CARE_PROVIDER_SITE_OTHER): Payer: Medicare Other | Admitting: Vascular Surgery

## 2014-12-18 VITALS — BP 146/98 | HR 58 | Ht 71.0 in | Wt 269.0 lb

## 2014-12-18 DIAGNOSIS — I714 Abdominal aortic aneurysm, without rupture, unspecified: Secondary | ICD-10-CM

## 2014-12-18 DIAGNOSIS — Z48812 Encounter for surgical aftercare following surgery on the circulatory system: Secondary | ICD-10-CM | POA: Diagnosis not present

## 2014-12-18 NOTE — Progress Notes (Signed)
    Established EVAR  History of Present Illness  Steven Hayden is a 64 y.o. (01/19/1951) male who presents for routine follow up s/p EVAR for sx AAA (Date: 05/18/14).  Most recent EVAR duplex (Date: 12/18/2014) demonstrates: no endoleak and shrinking sac size.  Most recent CTA (Date: 06/20/15) demonstrates: no endoleak and stable sac size.  The patient has prior back and abdominal pain resolved after EVAR.  Pt continues to smoke.  The patient's PMH, PSH, SH, FamHx, Med, and Allergies are unchanged from 06/19/14.  On ROS today: +B calf pain, atypical claudication sx  Physical Examination  Filed Vitals:   12/18/14 0905 12/18/14 0907  BP: 156/101 146/98  Pulse: 58 58  Height: 5\' 11"  (1.803 m)   Weight: 269 lb (122.018 kg)   SpO2: 94%    Body mass index is 37.53 kg/(m^2).  General: A&O x 3, WD, Obese, odor of cigarettes  Pulmonary: Sym exp, good air movt, CTAB, no rales, rhonchi, & wheezing  Cardiac: RRR, Nl S1, S2, no Murmurs, rubs or gallops  Vascular: Vessel Right Left  Radial Palpable Palpable  Brachial Palpable Palpable  Carotid Palpable, without bruit Palpable, without bruit  Aorta Not palpable N/A  Femoral Palpable Palpable  Popliteal Not palpable Not palpable  PT Not Palpable Not Palpable  DP Palpable Not Palpable   Gastrointestinal: soft, NTND, -G/R, - HSM, - masses, - CVAT B  Musculoskeletal: M/S 5/5 throughout , Extremities without ischemic changes   Neurologic: Pain and light touch intact in extremities , Motor exam as listed above  Non-Invasive Vascular Imaging  EVAR Duplex (Date: 12/18/2014 )  AAA sac size: 7.9 cm x 8.1 cm  Prior CTA: 10.5 cm x 9.5 cm  No endoleak detected   Medical Decision Making  Steven Hayden is a 64 y.o. male who presents s/p EVAR.  Pt is asymptomatic with decreasing sac size.  I discussed with the patient the importance of surveillance of the endograft.  The next endograft duplex will be scheduled for 12 months.  The  next CTA will be scheduled for 6 months.  The patient will follow up with [place] in 6 months with these studies.  Pt is having atypical claudication sx in his calves.  I will have him return in 4 weeks to repeat an ABI to check for progression in PAD, given change in exam today.  Thank you for allowing [place] to participate in this patient's care.  Adele Barthel, MD [facility] [place] Office: [phone] Pager: [phone]  12/18/2014, 9:17 AM

## 2015-01-13 ENCOUNTER — Other Ambulatory Visit: Payer: Self-pay | Admitting: *Deleted

## 2015-01-13 DIAGNOSIS — I739 Peripheral vascular disease, unspecified: Secondary | ICD-10-CM

## 2015-01-21 ENCOUNTER — Other Ambulatory Visit: Payer: Self-pay | Admitting: Vascular Surgery

## 2015-01-21 ENCOUNTER — Encounter: Payer: Self-pay | Admitting: Vascular Surgery

## 2015-01-21 DIAGNOSIS — Z48812 Encounter for surgical aftercare following surgery on the circulatory system: Secondary | ICD-10-CM

## 2015-01-21 DIAGNOSIS — I739 Peripheral vascular disease, unspecified: Secondary | ICD-10-CM

## 2015-01-22 ENCOUNTER — Ambulatory Visit (INDEPENDENT_AMBULATORY_CARE_PROVIDER_SITE_OTHER): Payer: Medicare Other | Admitting: Vascular Surgery

## 2015-01-22 ENCOUNTER — Ambulatory Visit (HOSPITAL_COMMUNITY)
Admission: RE | Admit: 2015-01-22 | Discharge: 2015-01-22 | Disposition: A | Discharge status: Home / Self Care | Payer: Medicare Other | Source: Ambulatory Visit | Attending: Vascular Surgery | Admitting: Vascular Surgery

## 2015-01-22 ENCOUNTER — Other Ambulatory Visit: Payer: Self-pay | Admitting: *Deleted

## 2015-01-22 ENCOUNTER — Encounter: Payer: Self-pay | Admitting: Vascular Surgery

## 2015-01-22 VITALS — BP 132/86 | HR 73 | Ht 71.0 in | Wt 271.0 lb

## 2015-01-22 DIAGNOSIS — I10 Essential (primary) hypertension: Secondary | ICD-10-CM | POA: Diagnosis not present

## 2015-01-22 DIAGNOSIS — F17200 Nicotine dependence, unspecified, uncomplicated: Secondary | ICD-10-CM | POA: Diagnosis not present

## 2015-01-22 DIAGNOSIS — Z48812 Encounter for surgical aftercare following surgery on the circulatory system: Secondary | ICD-10-CM | POA: Diagnosis present

## 2015-01-22 DIAGNOSIS — I714 Abdominal aortic aneurysm, without rupture, unspecified: Secondary | ICD-10-CM

## 2015-01-22 DIAGNOSIS — E785 Hyperlipidemia, unspecified: Secondary | ICD-10-CM | POA: Diagnosis not present

## 2015-01-22 DIAGNOSIS — I739 Peripheral vascular disease, unspecified: Secondary | ICD-10-CM | POA: Insufficient documentation

## 2015-01-22 NOTE — Progress Notes (Addendum)
    Established EVAR  History of Present Illness  Steven Hayden is a 64 y.o. (1951/04/25) male who presents for evaluation of his atypical claudication sx. Pt notes his prior cramping in his legs has resolved completed with discontinuation of statin.  The patient currently denies intermittent claudication or rest pain.  Pt continues to smoke.  The patient's PMH, PSH, SH, FamHx, Med, and Allergies are unchanged from 12/18/14  On ROS today: no wounds, no intermittent claudication or rest pain  Physical Examination  Filed Vitals:   01/22/15 0926  BP: 132/86  Pulse: 73  Height: 5\' 11"  (1.803 m)  Weight: 271 lb (122.925 kg)  SpO2: 92%   Body mass index is 37.81 kg/(m^2).  General: A&O x 3, WD, Obese  Pulmonary: Sym exp, good air movt, CTAB, no rales, rhonchi, & wheezing  Cardiac: RRR, Nl S1, S2, no Murmurs, rubs or gallops  Vascular: Vessel Right Left  Radial Palpable Palpable  Brachial Palpable Palpable  Carotid Palpable, without bruit Palpable, without bruit  Aorta Not palpable N/A  Femoral Palpable Palpable  Popliteal Not palpable Not palpable  PT Not Palpable Not Palpable  DP Palpable Not Palpable   Gastrointestinal: soft, NTND, -G/R, - HSM, - masses, - CVAT B  Musculoskeletal: M/S 5/5 throughout , Extremities without ischemic changes   Neurologic: Pain and light touch intact in extremities , Motor exam as listed above  Non-Invasive Vascular Imaging  ABI (Date: 01/22/2015)  R: 1.18, DP: bi, PT: bi, TBI: not done  L: 1.14, DP: tri, PT: tri, TBI: not done    Medical Decision Making  Steven Hayden is a 64 y.o. male who presents s/p EVAR. Pt is asymptomatic with decreasing sac size, no evidence of significant PAD in both legs   I discussed with the patient the importance of surveillance of the endograft.  The next CTA will be scheduled for 5 months.  The patient will follow up with [place] in 5 months with these studies.  I  recommended he get back in touch with his PCP to try a different statin given his ?reaction to Zocor.  Thank you for allowing [place] to participate in this patient's care.  Adele Barthel, MD [facility] [place] Office: [phone] Pager: [phone]  01/22/2015, 9:34 AM

## 2015-02-15 ENCOUNTER — Other Ambulatory Visit: Payer: Self-pay | Admitting: Family

## 2015-03-05 ENCOUNTER — Telehealth: Payer: Self-pay | Admitting: Family

## 2015-03-05 NOTE — Telephone Encounter (Signed)
Lm on vm to [facility] w/ cory

## 2015-06-05 DEATH — deceased

## 2015-06-15 ENCOUNTER — Other Ambulatory Visit: Payer: Self-pay | Admitting: Vascular Surgery

## 2015-06-15 LAB — POC BUN/CREATININE
BUN, ISTAT: 8 mg/dL (ref 8–26)
CREATININE, ISTAT: 1.2 mg/dL (ref 0.6–1.3)

## 2015-06-18 ENCOUNTER — Ambulatory Visit
Admission: RE | Admit: 2015-06-18 | Discharge: 2015-06-18 | Disposition: A | Discharge status: Home / Self Care | Payer: Medicare Other | Source: Ambulatory Visit | Attending: Vascular Surgery | Admitting: Vascular Surgery

## 2015-06-18 DIAGNOSIS — I714 Abdominal aortic aneurysm, without rupture, unspecified: Secondary | ICD-10-CM

## 2015-06-18 MED ORDER — IOPAMIDOL (ISOVUE-370) INJECTION 76%
100.0000 mL | Freq: Once | INTRAVENOUS | Status: AC | PRN
  Administered 2015-06-18: 80 mL via INTRAVENOUS

## 2015-06-25 ENCOUNTER — Ambulatory Visit: Payer: Medicare Other | Admitting: Vascular Surgery

## 2015-06-25 ENCOUNTER — Encounter: Payer: Self-pay | Admitting: Vascular Surgery

## 2015-06-30 ENCOUNTER — Encounter: Payer: Self-pay | Admitting: Vascular Surgery

## 2015-06-30 ENCOUNTER — Ambulatory Visit (INDEPENDENT_AMBULATORY_CARE_PROVIDER_SITE_OTHER): Payer: Medicare Other | Admitting: Vascular Surgery

## 2015-06-30 VITALS — BP 172/106 | HR 68 | Temp 98.1°F | Resp 16 | Ht 71.0 in | Wt 272.0 lb

## 2015-06-30 DIAGNOSIS — Z48812 Encounter for surgical aftercare following surgery on the circulatory system: Secondary | ICD-10-CM | POA: Diagnosis not present

## 2015-06-30 DIAGNOSIS — I714 Abdominal aortic aneurysm, without rupture, unspecified: Secondary | ICD-10-CM

## 2015-06-30 NOTE — Progress Notes (Addendum)
Established EVAR  History of Present Illness  Steven Hayden is a 64 y.o. ([birth year]) male who presents for routine follow up s/p EVAR (Date: 05/18/14) for sx AAA.  Most recent EVAR duplex (Date: 12/18/14) demonstrates: no endoleak and stable sac size.  Most recent CTA (Date: today) demonstrates: no endoleak and shrinking sac size.  The patient has not had back or abdominal pain.  His atypical leg pain has resolved.  The patient continues to smoke 1/2 pack a day.  The patient's PMH, PSH, SH, and FamHx are unchanged from 01/22/15.  Current Outpatient Prescriptions  Medication Sig Dispense Refill  . buPROPion (WELLBUTRIN XL) 150 MG 24 hr tablet Take 1 tablet (150 mg total) by mouth daily. 30 tablet 3  . cyclobenzaprine (FLEXERIL) 5 MG tablet Take 1 tablet (5 mg total) by mouth at bedtime. 15 tablet 1  . lisinopril-hydrochlorothiazide (PRINZIDE,ZESTORETIC) 20-25 MG per tablet TAKE TWO TABLETS BY MOUTH DAILY 60 tablet 2  . methylphenidate (CONCERTA) 36 MG PO CR tablet Take 1 tablet (36 mg total) by mouth every morning. 30 tablet 0  . simvastatin (ZOCOR) 40 MG tablet Take 1 tablet (40 mg total) by mouth at bedtime. 30 tablet 3  . tiotropium (SPIRIVA HANDIHALER) 18 MCG inhalation capsule Place 1 capsule (18 mcg total) into inhaler and inhale daily. 90 capsule 3   No current facility-administered medications for this visit.    Allergies  Allergen Reactions  . Codeine Phosphate     REACTION: swelling of uvula, hives    On ROS today: no intermittent claudication, continued smoking   Physical Examination  Filed Vitals:   06/30/15 1403 06/30/15 1408  BP: 162/106 172/106  Pulse: 74 68  Temp: 98.1 F (36.7 C)   TempSrc: Oral   Resp: 16   Height: 5\' 11"  (1.803 m)   Weight: 272 lb (123.378 kg)   SpO2: 96%    Body mass index is 37.95 kg/(m^2).  General: A&O x 3, WD, Obese,   Pulmonary: Sym exp, good air movt, CTAB, no rales, rhonchi, & wheezing  Cardiac: RRR, Nl S1, S2, no  Murmurs, rubs or gallops  Vascular: Vessel Right Left  Radial Palpable Palpable  Brachial Palpable Palpable  Carotid Palpable, without bruit Palpable, without bruit  Aorta Not palpable N/A  Femoral Palpable Palpable  Popliteal Not palpable Not palpable  PT Palpable Palpable  DP Palpable Palpable   Gastrointestinal: soft, NTND, no G/R, no HSM, no masses, no CVAT B  Musculoskeletal: M/S 5/5 throughout , Extremities without ischemic changes   Neurologic: Pain and light touch intact in extremities , Motor exam as listed above  CTA Abd/Pelvis Duplex (06/30/2015) 1. Technically successful endovascular repair of infrarenal abdominal aortic aneurysm without evidence of complication. Specifically, no evidence of endoleak. The excluded native abdominal aortic aneurysm sac has decreased in size in the interval, currently measuring 7.8 cm in maximal diameter, previously, 10.3 cm per Nonvascular Impression:  2.. No acute findings within the abdomen or pelvis.  Based on my review of the CTA, the endograft is in good position infrarenal without migration, limbs appear patent though there is some suggestion on rime of thrombus in both iliac limbs.  This is at an unusual location so I wonder if this is an artifact from image processing.    Medical Decision Making  Steven Hayden is a 64 y.o. male who presents s/p EVAR.  Pt is asymptomatic with decreasing sac size.  I discussed with the patient the importance of surveillance of  the endograft.  The next endograft duplex will be scheduled for 6 months.  As the sac is already decreasing, will hold off on further CTA until 5 years as suggested in the OVER trial.  The patient will follow up with [place] in 6 months with these studies.  I again emphasized to the patient the importance of smoking cessation to avoid cardiovascular co-morbidities and to avoid further aneurysmal growth. Patient's blood pressure was found to be elevated.  Patient was  referred back to their PCP for further management.  Thank you for allowing [place] to participate in this patient's care.   Adele Barthel, MD [facility] [place] Office: [phone] Pager: [phone]  06/30/2015, 2:16 PM  VASCULAR QUALITY INITIATIVE FOLLOW UP DATA:  Current smoker: [x ] yes  [  ] no  Living status: [ x]  Home  [  ] Nursing home  [  ] Homeless    MEDS:  ASA [  ] yes  [  ] no- [  ] medical reason  [ x] non compliant  STATIN  [ x] yes  [  ] no- [  ] medical reason  [  ] non compliant  Beta blocker [  ] yes  [x ] no- [  ] medical reason  [  ] non compliant  ACE inhibitor [ x] yes  [  ] no- [  ] medical reason  [  ] non compliant  P2Y12 Antagonist [ x] none  [  ] clopidogrel-Plavix  [  ] ticlopidine-Ticlid   [  ] prasugrel-Effient  [  ] ticagrelor- Brilinta    Anticoagulant [ x ] None  [  ] warfarin  [  ] rivaroxaban-Xarelto [  ] dabigatran- Pradaxa  Current Max AAA = 7.8 mm  Endoleak:  [x ] no  [  ] yes- [  ] type I  [  ] type II  [  ] type III  [  ] indeterminate  Number of new interventions: 0 -   Date:      Why:  [  ] endoleak  [  ] limb occlusion   [  ] growth  [  ]  Migration   [  ] symtomatic/ rupture    Conversion to open repair: [  ] yes   [ x] no   Date:   Other operation related to EVAR:  [  ] yes   [ x ] no

## 2015-06-30 NOTE — Addendum Note (Signed)
Addended by: Mena Goes on: 06/30/2015 02:26 PM   Modules accepted: Orders

## 2015-09-09 DIAGNOSIS — Z6838 Body mass index (BMI) 38.0-38.9, adult: Secondary | ICD-10-CM | POA: Diagnosis not present

## 2015-09-09 DIAGNOSIS — E291 Testicular hypofunction: Secondary | ICD-10-CM | POA: Diagnosis not present

## 2015-09-09 DIAGNOSIS — F909 Attention-deficit hyperactivity disorder, unspecified type: Secondary | ICD-10-CM | POA: Diagnosis not present

## 2015-12-08 DIAGNOSIS — R05 Cough: Secondary | ICD-10-CM | POA: Diagnosis not present

## 2015-12-08 DIAGNOSIS — F988 Other specified behavioral and emotional disorders with onset usually occurring in childhood and adolescence: Secondary | ICD-10-CM | POA: Diagnosis not present

## 2015-12-08 DIAGNOSIS — F209 Schizophrenia, unspecified: Secondary | ICD-10-CM | POA: Diagnosis not present

## 2015-12-31 ENCOUNTER — Encounter: Payer: Self-pay | Admitting: Family

## 2016-01-07 ENCOUNTER — Ambulatory Visit (HOSPITAL_COMMUNITY)
Admission: RE | Admit: 2016-01-07 | Discharge: 2016-01-07 | Disposition: A | Discharge status: Home / Self Care | Payer: Commercial Managed Care - HMO | Source: Ambulatory Visit | Attending: Family | Admitting: Family

## 2016-01-07 ENCOUNTER — Encounter: Payer: Self-pay | Admitting: Family

## 2016-01-07 ENCOUNTER — Ambulatory Visit (INDEPENDENT_AMBULATORY_CARE_PROVIDER_SITE_OTHER): Payer: Commercial Managed Care - HMO | Admitting: Family

## 2016-01-07 VITALS — BP 131/89 | HR 65 | Ht 71.0 in | Wt 261.0 lb

## 2016-01-07 DIAGNOSIS — Z95828 Presence of other vascular implants and grafts: Secondary | ICD-10-CM

## 2016-01-07 DIAGNOSIS — Z720 Tobacco use: Secondary | ICD-10-CM

## 2016-01-07 DIAGNOSIS — Z48812 Encounter for surgical aftercare following surgery on the circulatory system: Secondary | ICD-10-CM

## 2016-01-07 DIAGNOSIS — F17200 Nicotine dependence, unspecified, uncomplicated: Secondary | ICD-10-CM

## 2016-01-07 DIAGNOSIS — I10 Essential (primary) hypertension: Secondary | ICD-10-CM | POA: Diagnosis not present

## 2016-01-07 DIAGNOSIS — I714 Abdominal aortic aneurysm, without rupture, unspecified: Secondary | ICD-10-CM

## 2016-01-07 DIAGNOSIS — E785 Hyperlipidemia, unspecified: Secondary | ICD-10-CM | POA: Diagnosis not present

## 2016-01-07 DIAGNOSIS — E669 Obesity, unspecified: Secondary | ICD-10-CM

## 2016-01-07 NOTE — Progress Notes (Signed)
[facility] [place]  Established EVAR  History of Present Illness  Steven Hayden is a 65 y.o. (1951/06/16) male patient of Dr. Bridgett Larsson who presents for routine follow up s/p EVAR (Date: 05/18/14) for a large (>10 cm) sx AAA. Most recent EVAR duplex (Date: 12/18/14) demonstrates: no endoleak and stable sac size. Most recent CTA (Date: 06/30/15) demonstrates: no endoleak and shrinking sac size. The patient has not had back or abdominal pain. His atypical leg pain has resolved. The patient continues to smoke 1pack a day. He states he has severe COPD and wants to quit smoking. He has tried statins and they cause severe myalgias.  He denies any known hx of stroke or TIA, denies claudication with walking.  He has intentionally lost 11 pounds in a month.  Pt Diabetic: No Pt smoker: smoker  (1 ppd, started at 16 yrs)   Past Medical History  Diagnosis Date  . Hyperlipidemia   . Hypertension   . ADD (attention deficit disorder)   . Tobacco abuse   . COPD (chronic obstructive pulmonary disease) ([place])   . Atrial fibrillation ([place])   . AAA (abdominal aortic aneurysm) [facility])    Past Surgical History  Procedure Laterality Date  . Knee surgery      bilateral  . Abdominal aortic endovascular stent graft N/A 05/18/2014    Procedure: ABDOMINAL AORTIC ENDOVASCULAR STENT GRAFT;  Surgeon: Conrad [place], MD;  Location: [facility] OR;  Service: Vascular;  Laterality: N/A;  . Abdominal aortic aneurysm repair     Social History Social History  Substance Use Topics  . Smoking status: Current Every Day Smoker -- 1.00 packs/day for 50 years    Types: Cigarettes  . Smokeless tobacco: Never Used  . Alcohol Use: 12.0 oz/week    20 Shots of liquor per week   Family History Family History  Problem Relation Age of Onset  . Hypertension Brother   . Hyperlipidemia Brother   . Hypertension Father   . Hyperlipidemia Father   . Cancer Father    Current Outpatient Prescriptions on File Prior  to Visit  Medication Sig Dispense Refill  . cyclobenzaprine (FLEXERIL) 5 MG tablet Take 1 tablet (5 mg total) by mouth at bedtime. 15 tablet 1  . lisinopril-hydrochlorothiazide (PRINZIDE,ZESTORETIC) 20-25 MG per tablet TAKE TWO TABLETS BY MOUTH DAILY 60 tablet 2  . methylphenidate (CONCERTA) 36 MG PO CR tablet Take 1 tablet (36 mg total) by mouth every morning. 30 tablet 0  . tiotropium (SPIRIVA HANDIHALER) 18 MCG inhalation capsule Place 1 capsule (18 mcg total) into inhaler and inhale daily. 90 capsule 3  . buPROPion (WELLBUTRIN XL) 150 MG 24 hr tablet Take 1 tablet (150 mg total) by mouth daily. (Patient not taking: Reported on 01/07/2016) 30 tablet 3  . simvastatin (ZOCOR) 40 MG tablet Take 1 tablet (40 mg total) by mouth at bedtime. (Patient not taking: Reported on 01/07/2016) 30 tablet 3   No current facility-administered medications on file prior to visit.   Allergies  Allergen Reactions  . Codeine Phosphate     REACTION: swelling of uvula, hives     ROS: See HPI for pertinent positives and negatives.  Physical Examination  Filed Vitals:   01/07/16 0839  BP: 131/89  Pulse: 65  Height: 5\' 11"  (1.803 m)  Weight: 261 lb (118.389 kg)  SpO2: 92%   Body mass index is 36.42 kg/(m^2).  General: A&O x 3, WD, obese male  Pulmonary: Sym exp, respirations are slightly labored at  rest, limited air movt, few wheezes, no rales or rhonchi. Frequent cough.  Cardiac: RRR, Nl S1, S2, no detected murmur  Vascular: Vessel Right Left  Radial Palpable Palpable  Brachial Palpable Palpable  Carotid Palpable, without bruit Palpable, without bruit  Aorta Not palpable N/A  Femoral Not Palpable(obese) Not Palpable(obese)  Popliteal Not palpable Not palpable  PT Palpable Palpable  DP Palpable Palpable   Gastrointestinal: soft, NTND, no G/R, no HSM, no palpable masses, no CVAT B, large panus  Musculoskeletal: M/S 5/5 throughout , Extremities without ischemic changes    Neurologic: Pain and light touch intact in extremities , Motor exam as listed above    CTA Abd/Pelvis Duplex (06/30/2015) 1. Technically successful endovascular repair of infrarenal abdominal aortic aneurysm without evidence of complication. Specifically, no evidence of endoleak. The excluded native abdominal aortic aneurysm sac has decreased in size in the interval, currently measuring 7.8 cm in maximal diameter, previously, 10.3 cm per Nonvascular Impression:  2.. No acute findings within the abdomen or pelvis        Non-Invasive Vascular Imaging  EVAR Duplex (Date: 01/07/2016)  AAA sac size: 6.28 cm x 6.94 cm (01/07/2016)  AAA sac size: 7.89 cm x 8.07 cm (12/18/14)  no endoleak detected   Medical Decision Making  Steven Hayden is a 65 y.o. male who presents s/p EVAR (Date: 05/18/14).  Pt is asymptomatic with a decrease in sac size. Follow up in 1 year with EVAR duplex. Dr. Lianne Moris recommendation when he saw pt on 06/30/15: As the sac is already decreasing, will hold off on further CTA until 5 years as suggested in the OVER trial. This would be due 5 years post operative which would be September of 2020.  The patient was counseled re smoking cessation and given several free resources re smoking cessation. He expressed motivation to quit. He is trying to and is losing weight     I discussed with the patient the importance of surveillance of the endograft.  I emphasized the importance of maximal medical management including strict control of blood pressure, blood glucose, and lipid levels, antiplatelet agents, obtaining regular exercise, and cessation of smoking.   Thank you for allowing [place] to participate in this patient's care.  Clemon Chambers, RN, MSN, FNP-C [facility] [place] Office: [phone]  Clinic Physician: Bridgett Larsson  01/07/2016, 8:50 AM

## 2016-01-07 NOTE — Patient Instructions (Signed)
Smoking Cessation, Tips for Success If you are ready to quit smoking, congratulations! You have chosen to help yourself be healthier. Cigarettes bring nicotine, tar, carbon monoxide, and other irritants into your body. Your lungs, heart, and blood vessels will be able to work better without these poisons. There are many different ways to quit smoking. Nicotine gum, nicotine patches, a nicotine inhaler, or nicotine nasal spray can help with physical craving. Hypnosis, support groups, and medicines help break the habit of smoking. WHAT THINGS CAN I DO TO MAKE QUITTING EASIER?  Here are some tips to help you quit for good:  Pick a date when you will quit smoking completely. Tell all of your friends and family about your plan to quit on that date.  Do not try to slowly cut down on the number of cigarettes you are smoking. Pick a quit date and quit smoking completely starting on that day.  Throw away all cigarettes.   Clean and remove all ashtrays from your home, work, and car.  On a card, write down your reasons for quitting. Carry the card with you and read it when you get the urge to smoke.  Cleanse your body of nicotine. Drink enough water and fluids to keep your urine clear or pale yellow. Do this after quitting to flush the nicotine from your body.  Learn to predict your moods. Do not let a bad situation be your excuse to have a cigarette. Some situations in your life might tempt you into wanting a cigarette.  Never have "just one" cigarette. It leads to wanting another and another. Remind yourself of your decision to quit.  Change habits associated with smoking. If you smoked while driving or when feeling stressed, try other activities to replace smoking. Stand up when drinking your coffee. Brush your teeth after eating. Sit in a different chair when you read the paper. Avoid alcohol while trying to quit, and try to drink fewer caffeinated beverages. Alcohol and caffeine may urge you to  smoke.  Avoid foods and drinks that can trigger a desire to smoke, such as sugary or spicy foods and alcohol.  Ask people who smoke not to smoke around you.  Have something planned to do right after eating or having a cup of coffee. For example, plan to take a walk or exercise.  Try a relaxation exercise to calm you down and decrease your stress. Remember, you may be tense and nervous for the first 2 weeks after you quit, but this will pass.  Find new activities to keep your hands busy. Play with a pen, coin, or rubber band. Doodle or draw things on paper.  Brush your teeth right after eating. This will help cut down on the craving for the taste of tobacco after meals. You can also try mouthwash.   Use oral substitutes in place of cigarettes. Try using lemon drops, carrots, cinnamon sticks, or chewing gum. Keep them handy so they are available when you have the urge to smoke.  When you have the urge to smoke, try deep breathing.  Designate your home as a nonsmoking area.  If you are a heavy smoker, ask your health care provider about a prescription for nicotine chewing gum. It can ease your withdrawal from nicotine.  Reward yourself. Set aside the cigarette money you save and buy yourself something nice.  Look for support from others. Join a support group or smoking cessation program. Ask someone at home or at work to help you with your plan   to quit smoking.  Always ask yourself, "Do I need this cigarette or is this just a reflex?" Tell yourself, "Today, I choose not to smoke," or "I do not want to smoke." You are reminding yourself of your decision to quit.  Do not replace cigarette smoking with electronic cigarettes (commonly called e-cigarettes). The safety of e-cigarettes is unknown, and some may contain harmful chemicals.  If you relapse, do not give up! Plan ahead and think about what you will do the next time you get the urge to smoke. HOW WILL I FEEL WHEN I QUIT SMOKING? You  may have symptoms of withdrawal because your body is used to nicotine (the addictive substance in cigarettes). You may crave cigarettes, be irritable, feel very hungry, cough often, get headaches, or have difficulty concentrating. The withdrawal symptoms are only temporary. They are strongest when you first quit but will go away within 10-14 days. When withdrawal symptoms occur, stay in control. Think about your reasons for quitting. Remind yourself that these are signs that your body is healing and getting used to being without cigarettes. Remember that withdrawal symptoms are easier to treat than the major diseases that smoking can cause.  Even after the withdrawal is over, expect periodic urges to smoke. However, these cravings are generally short lived and will go away whether you smoke or not. Do not smoke! WHAT RESOURCES ARE AVAILABLE TO HELP ME QUIT SMOKING? Your health care provider can direct you to community resources or hospitals for support, which may include:  Group support.  Education.  Hypnosis.  Therapy.   This information is not intended to replace advice given to you by your health care provider. Make sure you discuss any questions you have with your health care provider.   Document Released: 05/19/2004 Document Revised: 09/11/2014 Document Reviewed: 02/06/2013 Elsevier Interactive Patient Education 2016 Elsevier Inc.    Steps to Quit Smoking  Smoking tobacco can be harmful to your health and can affect almost every organ in your body. Smoking puts you, and those around you, at risk for developing many serious chronic diseases. Quitting smoking is difficult, but it is one of the best things that you can do for your health. It is never too late to quit. WHAT ARE THE BENEFITS OF QUITTING SMOKING? When you quit smoking, you lower your risk of developing serious diseases and conditions, such as:  Lung cancer or lung disease, such as COPD.  Heart disease.  Stroke.  Heart  attack.  Infertility.  Osteoporosis and bone fractures. Additionally, symptoms such as coughing, wheezing, and shortness of breath may get better when you quit. You may also find that you get sick less often because your body is stronger at fighting off colds and infections. If you are pregnant, quitting smoking can help to reduce your chances of having a baby of low birth weight. HOW DO I GET READY TO QUIT? When you decide to quit smoking, create a plan to make sure that you are successful. Before you quit:  Pick a date to quit. Set a date within the next two weeks to give you time to prepare.  Write down the reasons why you are quitting. Keep this list in places where you will see it often, such as on your bathroom mirror or in your car or wallet.  Identify the people, places, things, and activities that make you want to smoke (triggers) and avoid them. Make sure to take these actions:  Throw away all cigarettes at home, at work,   and in your car.  Throw away smoking accessories, such as ashtrays and lighters.  Clean your car and make sure to empty the ashtray.  Clean your home, including curtains and carpets.  Tell your family, friends, and coworkers that you are quitting. Support from your loved ones can make quitting easier.  Talk with your health care provider about your options for quitting smoking.  Find out what treatment options are covered by your health insurance. WHAT STRATEGIES CAN I USE TO QUIT SMOKING?  Talk with your healthcare provider about different strategies to quit smoking. Some strategies include:  Quitting smoking altogether instead of gradually lessening how much you smoke over a period of time. Research shows that quitting "cold turkey" is more successful than gradually quitting.  Attending in-person counseling to help you build problem-solving skills. You are more likely to have success in quitting if you attend several counseling sessions. Even short  sessions of 10 minutes can be effective.  Finding resources and support systems that can help you to quit smoking and remain smoke-free after you quit. These resources are most helpful when you use them often. They can include:  Online chats with a counselor.  Telephone quitlines.  Printed self-help materials.  Support groups or group counseling.  Text messaging programs.  Mobile phone applications.  Taking medicines to help you quit smoking. (If you are pregnant or breastfeeding, talk with your health care provider first.) Some medicines contain nicotine and some do not. Both types of medicines help with cravings, but the medicines that include nicotine help to relieve withdrawal symptoms. Your health care provider may recommend:  Nicotine patches, gum, or lozenges.  Nicotine inhalers or sprays.  Non-nicotine medicine that is taken by mouth. Talk with your health care provider about combining strategies, such as taking medicines while you are also receiving in-person counseling. Using these two strategies together makes you more likely to succeed in quitting than if you used either strategy on its own. If you are pregnant or breastfeeding, talk with your health care provider about finding counseling or other support strategies to quit smoking. Do not take medicine to help you quit smoking unless told to do so by your health care provider. WHAT THINGS CAN I DO TO MAKE IT EASIER TO QUIT? Quitting smoking might feel overwhelming at first, but there is a lot that you can do to make it easier. Take these important actions:  Reach out to your family and friends and ask that they support and encourage you during this time. Call telephone quitlines, reach out to support groups, or work with a counselor for support.  Ask people who smoke to avoid smoking around you.  Avoid places that trigger you to smoke, such as bars, parties, or smoke-break areas at work.  Spend time around people who do  not smoke.  Lessen stress in your life, because stress can be a smoking trigger for some people. To lessen stress, try:  Exercising regularly.  Deep-breathing exercises.  Yoga.  Meditating.  Performing a body scan. This involves closing your eyes, scanning your body from head to toe, and noticing which parts of your body are particularly tense. Purposefully relax the muscles in those areas.  Download or purchase mobile phone or tablet apps (applications) that can help you stick to your quit plan by providing reminders, tips, and encouragement. There are many free apps, such as QuitGuide from the CDC (Centers for Disease Control and Prevention). You can find other support for quitting smoking (smoking   cessation) through smokefree.gov and other websites. HOW WILL I FEEL WHEN I QUIT SMOKING? Within the first 24 hours of quitting smoking, you may start to feel some withdrawal symptoms. These symptoms are usually most noticeable 2-3 days after quitting, but they usually do not last beyond 2-3 weeks. Changes or symptoms that you might experience include:  Mood swings.  Restlessness, anxiety, or irritation.  Difficulty concentrating.  Dizziness.  Strong cravings for sugary foods in addition to nicotine.  Mild weight gain.  Constipation.  Nausea.  Coughing or a sore throat.  Changes in how your medicines work in your body.  A depressed mood.  Difficulty sleeping (insomnia). After the first 2-3 weeks of quitting, you may start to notice more positive results, such as:  Improved sense of smell and taste.  Decreased coughing and sore throat.  Slower heart rate.  Lower blood pressure.  Clearer skin.  The ability to breathe more easily.  Fewer sick days. Quitting smoking is very challenging for most people. Do not get discouraged if you are not successful the first time. Some people need to make many attempts to quit before they achieve long-term success. Do your best to  stick to your quit plan, and talk with your health care provider if you have any questions or concerns.   This information is not intended to replace advice given to you by your health care provider. Make sure you discuss any questions you have with your health care provider.   Document Released: 08/15/2001 Document Revised: 01/05/2015 Document Reviewed: 01/05/2015 Elsevier Interactive Patient Education 2016 Elsevier Inc.  

## 2016-02-15 ENCOUNTER — Encounter: Payer: Self-pay | Admitting: Cardiology

## 2016-02-21 NOTE — Addendum Note (Signed)
Addended by: Pace Lamadrid K on: 02/21/2016 05:13 PM   Modules accepted: Orders  

## 2016-05-29 DIAGNOSIS — Z125 Encounter for screening for malignant neoplasm of prostate: Secondary | ICD-10-CM | POA: Diagnosis not present

## 2016-05-29 DIAGNOSIS — Z0000 Encounter for general adult medical examination without abnormal findings: Secondary | ICD-10-CM | POA: Diagnosis not present

## 2016-05-29 DIAGNOSIS — E785 Hyperlipidemia, unspecified: Secondary | ICD-10-CM | POA: Diagnosis not present

## 2016-05-29 DIAGNOSIS — F909 Attention-deficit hyperactivity disorder, unspecified type: Secondary | ICD-10-CM | POA: Diagnosis not present

## 2016-05-29 DIAGNOSIS — Z23 Encounter for immunization: Secondary | ICD-10-CM | POA: Diagnosis not present

## 2016-10-20 DIAGNOSIS — K921 Melena: Secondary | ICD-10-CM | POA: Diagnosis not present

## 2016-10-20 DIAGNOSIS — Z86010 Personal history of colonic polyps: Secondary | ICD-10-CM | POA: Diagnosis not present

## 2016-10-25 DIAGNOSIS — K648 Other hemorrhoids: Secondary | ICD-10-CM | POA: Diagnosis not present

## 2016-10-25 DIAGNOSIS — K635 Polyp of colon: Secondary | ICD-10-CM | POA: Diagnosis not present

## 2016-10-25 DIAGNOSIS — Z86010 Personal history of colonic polyps: Secondary | ICD-10-CM | POA: Diagnosis not present

## 2016-10-25 DIAGNOSIS — D126 Benign neoplasm of colon, unspecified: Secondary | ICD-10-CM | POA: Diagnosis not present

## 2016-10-31 DIAGNOSIS — K635 Polyp of colon: Secondary | ICD-10-CM | POA: Diagnosis not present

## 2016-10-31 DIAGNOSIS — D126 Benign neoplasm of colon, unspecified: Secondary | ICD-10-CM | POA: Diagnosis not present

## 2016-11-27 DIAGNOSIS — Z720 Tobacco use: Secondary | ICD-10-CM | POA: Diagnosis not present

## 2016-11-27 DIAGNOSIS — J449 Chronic obstructive pulmonary disease, unspecified: Secondary | ICD-10-CM | POA: Diagnosis not present

## 2016-11-27 DIAGNOSIS — F209 Schizophrenia, unspecified: Secondary | ICD-10-CM | POA: Diagnosis not present

## 2016-11-27 DIAGNOSIS — F3342 Major depressive disorder, recurrent, in full remission: Secondary | ICD-10-CM | POA: Diagnosis not present

## 2016-11-27 DIAGNOSIS — E785 Hyperlipidemia, unspecified: Secondary | ICD-10-CM | POA: Diagnosis not present

## 2016-11-27 DIAGNOSIS — F319 Bipolar disorder, unspecified: Secondary | ICD-10-CM | POA: Diagnosis not present

## 2016-11-27 DIAGNOSIS — I10 Essential (primary) hypertension: Secondary | ICD-10-CM | POA: Diagnosis not present

## 2016-11-27 DIAGNOSIS — F909 Attention-deficit hyperactivity disorder, unspecified type: Secondary | ICD-10-CM | POA: Diagnosis not present

## 2016-12-06 DIAGNOSIS — J029 Acute pharyngitis, unspecified: Secondary | ICD-10-CM | POA: Diagnosis not present

## 2017-01-04 ENCOUNTER — Encounter: Payer: Self-pay | Admitting: Family

## 2017-01-12 ENCOUNTER — Encounter: Payer: Self-pay | Admitting: Family

## 2017-01-12 ENCOUNTER — Ambulatory Visit (INDEPENDENT_AMBULATORY_CARE_PROVIDER_SITE_OTHER): Payer: Medicare HMO | Admitting: Family

## 2017-01-12 ENCOUNTER — Ambulatory Visit (HOSPITAL_COMMUNITY)
Admission: RE | Admit: 2017-01-12 | Discharge: 2017-01-12 | Disposition: A | Discharge status: Home / Self Care | Payer: Medicare HMO | Source: Ambulatory Visit | Attending: Family | Admitting: Family

## 2017-01-12 VITALS — BP 119/75 | HR 71 | Temp 97.4°F | Resp 18 | Ht 71.0 in | Wt 257.0 lb

## 2017-01-12 DIAGNOSIS — F17200 Nicotine dependence, unspecified, uncomplicated: Secondary | ICD-10-CM

## 2017-01-12 DIAGNOSIS — I714 Abdominal aortic aneurysm, without rupture, unspecified: Secondary | ICD-10-CM

## 2017-01-12 DIAGNOSIS — E669 Obesity, unspecified: Secondary | ICD-10-CM | POA: Diagnosis not present

## 2017-01-12 DIAGNOSIS — Z48812 Encounter for surgical aftercare following surgery on the circulatory system: Secondary | ICD-10-CM | POA: Insufficient documentation

## 2017-01-12 DIAGNOSIS — Z95828 Presence of other vascular implants and grafts: Secondary | ICD-10-CM

## 2017-01-12 NOTE — Progress Notes (Signed)
[facility] [place]  CC: Follow up s/p Endovascular Repair of Abdominal Aortic Aneurysm    History of Present Illness  Steven Hayden is a 66 y.o. (25-Jul-1951) male patient of Dr. Bridgett Larsson who presents for routine follow up s/p EVAR (Date: 05/18/14) for a large (>10 cm) sx AAA. Most recent EVAR duplex (Date: 12/18/14) demonstrates: no endoleak and stable sac size. Most recent CTA (Date: 06/30/15) demonstrates: no endoleak and shrinking sac size. The patient has not had back or abdominal pain. His atypical leg pain has resolved. The patient continues to smoke 1pack a day. He states he has severe COPD and wants to quit smoking. He has tried statins and they cause severe myalgias.  He denies any known hx of stroke or TIA, denies claudication symptoms with walking. He reports tingling on the soles of his feet only when supine.   He is trying to lose weight. Started the [place] diet.   Pt Diabetic: No Pt smoker: smoker  (1 ppd, started at 16 yrs, states he has quit)   Past Medical History:  Diagnosis Date  . AAA (abdominal aortic aneurysm) ([place])   . ADD (attention deficit disorder)   . Atrial fibrillation ([place])   . COPD (chronic obstructive pulmonary disease) ([place])   . Hyperlipidemia   . Hypertension   . Tobacco abuse    Past Surgical History:  Procedure Laterality Date  . ABDOMINAL AORTIC ANEURYSM REPAIR    . ABDOMINAL AORTIC ENDOVASCULAR STENT GRAFT N/A 05/18/2014   Procedure: ABDOMINAL AORTIC ENDOVASCULAR STENT GRAFT;  Surgeon: Conrad Reese, MD;  Location: [facility] OR;  Service: Vascular;  Laterality: N/A;  . KNEE SURGERY     bilateral   Social History Social History  Substance Use Topics  . Smoking status: Current Every Day Smoker    Packs/day: 1.00    Years: 50.00    Types: Cigarettes  . Smokeless tobacco: Never Used  . Alcohol use 12.0 oz/week    20 Shots of liquor per week   Family History Family History  Problem Relation Age of Onset  .  Hypertension Brother   . Hyperlipidemia Brother   . Hypertension Father   . Hyperlipidemia Father   . Cancer Father    Current Outpatient Prescriptions on File Prior to Visit  Medication Sig Dispense Refill  . buPROPion (WELLBUTRIN XL) 150 MG 24 hr tablet Take 1 tablet (150 mg total) by mouth daily. 30 tablet 3  . lisinopril-hydrochlorothiazide (PRINZIDE,ZESTORETIC) 20-25 MG per tablet TAKE TWO TABLETS BY MOUTH DAILY 60 tablet 2  . methylphenidate (CONCERTA) 36 MG PO CR tablet Take 1 tablet (36 mg total) by mouth every morning. 30 tablet 0  . tiotropium (SPIRIVA HANDIHALER) 18 MCG inhalation capsule Place 1 capsule (18 mcg total) into inhaler and inhale daily. 90 capsule 3  . cyclobenzaprine (FLEXERIL) 5 MG tablet Take 1 tablet (5 mg total) by mouth at bedtime. (Patient not taking: Reported on 01/12/2017) 15 tablet 1  . simvastatin (ZOCOR) 40 MG tablet Take 1 tablet (40 mg total) by mouth at bedtime. (Patient not taking: Reported on 01/12/2017) 30 tablet 3   No current facility-administered medications on file prior to visit.    Allergies  Allergen Reactions  . Codeine Phosphate     REACTION: swelling of uvula, hives     ROS: See HPI for pertinent positives and negatives.  Physical Examination  Vitals:   01/12/17 0836  BP: 119/75  Pulse: 71  Resp: 18  Temp: 97.4 F (36.3  C)  SpO2: 93%  Weight: 257 lb (116.6 kg)  Height: 5\' 11"  (1.803 m)   Body mass index is 35.84 kg/m.  General: A&O x 3, WD, obese male  Pulmonary: Sym exp, respirations are slightly labored with mild exertion, limited air movt, no wheezes, rales, or rhonchi. Occasional dry cough.  Cardiac: RRR, Nl S1, S2, no detected murmur  Vascular: Vessel Right Left  Radial Palpable Palpable  Brachial Palpable Palpable  Carotid Palpable, without bruit Palpable, without bruit  Aorta Not palpable N/A  Femoral  Palpable  Palpable  Popliteal Not palpable Not palpable  PT Palpable Palpable   DP Palpable Palpable   Gastrointestinal: soft, NTND, no G/R, no HSM, no palpable masses, no CVAT B, large panus, asymptomatic small reducible umbilical hernia.  Musculoskeletal: M/S 5/5 throughout , Extremities without ischemic changes   Neurologic: Pain and light touch intact in extremities , Motor exam as listed above    CTA Abd/Pelvis Duplex (06/30/2015) 1. Technically successful endovascular repair of infrarenal abdominal aortic aneurysm without evidence of complication. Specifically, no evidence of endoleak. The excluded native abdominal aortic aneurysm sac has decreased in size in the interval, currently measuring 7.8 cm in maximal diameter, previously, 10.3 cm per Nonvascular Impression:  2.. No acute findings within the abdomen or pelvis   Non-Invasive Vascular Imaging  EVAR Duplex (Date: 01/12/17)  AAA sac size: 6.47 cm; Right CIA: 1.62 cm; Left CIA: 1.74 cm. Technically difficult exam due to body habitus and bowel gas.   no endoleak detected  Previous duplex: 01-07-16: 6.94 cm   Medical Decision Making  Steven Hayden is a 66 y.o. male who presents s/p EVAR (Date: 05/18/14).  Pt is asymptomatic with a decrease in sac size to 6.47 cm from 6.94 cm, based on limited visualization.   Dr. Lianne Moris recommendation when he saw pt on 06/30/15: As the sac is already decreasing, will hold off on further CTA until 5 years as suggested in the OVER trial. This would be due 5 years post operative which would be September of 2020.  The patient was counseled re smoking cessation and given several free resources re smoking cessation. He expressed motivation to quit.   I discussed with the patient the importance of surveillance of the endograft.  The next endograft duplex will be scheduled for 12 months.  The patient will follow up with [place] in 12 months with these studies.  I emphasized the importance of maximal medical management including strict control of blood  pressure, blood glucose, and lipid levels, antiplatelet agents, obtaining regular exercise, and cessation of smoking.   Thank you for allowing [place] to participate in this patient's care.  Clemon Chambers, RN, MSN, FNP-C [facility] [place] Office: [phone]  Clinic Physician: Chen/Cain  01/12/2017, 8:45 AM

## 2017-01-12 NOTE — Patient Instructions (Addendum)
Steps to Quit Smoking Smoking tobacco can be bad for your health. It can also affect almost every organ in your body. Smoking puts you and people around you at risk for many serious long-lasting (chronic) diseases. Quitting smoking is hard, but it is one of the best things that you can do for your health. It is never too late to quit. What are the benefits of quitting smoking? When you quit smoking, you lower your risk for getting serious diseases and conditions. They can include:  Lung cancer or lung disease.  Heart disease.  Stroke.  Heart attack.  Not being able to have children (infertility).  Weak bones (osteoporosis) and broken bones (fractures). If you have coughing, wheezing, and shortness of breath, those symptoms may get better when you quit. You may also get sick less often. If you are pregnant, quitting smoking can help to lower your chances of having a baby of low birth weight. What can I do to help me quit smoking? Talk with your doctor about what can help you quit smoking. Some things you can do (strategies) include:  Quitting smoking totally, instead of slowly cutting back how much you smoke over a period of time.  Going to in-person counseling. You are more likely to quit if you go to many counseling sessions.  Using resources and support systems, such as:  Online chats with a counselor.  Phone quitlines.  Printed self-help materials.  Support groups or group counseling.  Text messaging programs.  Mobile phone apps or applications.  Taking medicines. Some of these medicines may have nicotine in them. If you are pregnant or breastfeeding, do not take any medicines to quit smoking unless your doctor says it is okay. Talk with your doctor about counseling or other things that can help you. Talk with your doctor about using more than one strategy at the same time, such as taking medicines while you are also going to in-person counseling. This can help make quitting  easier. What things can I do to make it easier to quit? Quitting smoking might feel very hard at first, but there is a lot that you can do to make it easier. Take these steps:  Talk to your family and friends. Ask them to support and encourage you.  Call phone quitlines, reach out to support groups, or work with a counselor.  Ask people who smoke to not smoke around you.  Avoid places that make you want (trigger) to smoke, such as:  Bars.  Parties.  Smoke-break areas at work.  Spend time with people who do not smoke.  Lower the stress in your life. Stress can make you want to smoke. Try these things to help your stress:  Getting regular exercise.  Deep-breathing exercises.  Yoga.  Meditating.  Doing a body scan. To do this, close your eyes, focus on one area of your body at a time from head to toe, and notice which parts of your body are tense. Try to relax the muscles in those areas.  Download or buy apps on your mobile phone or tablet that can help you stick to your quit plan. There are many free apps, such as QuitGuide from the CDC (Centers for Disease Control and Prevention). You can find more support from smokefree.gov and other websites. This information is not intended to replace advice given to you by your health care provider. Make sure you discuss any questions you have with your health care provider. Document Released: 06/17/2009 Document Revised: 04/18/2016 Document   Reviewed: 01/05/2015 Elsevier Interactive Patient Education  2017 [place].     Before your next abdominal ultrasound:  Take two Extra-Strength Gas-X capsules at bedtime the night before the test. Take another two Extra-Strength Gas-X capsules 3 hours before the test.  Avoid gas forming foods the day before the test.

## 2017-01-15 NOTE — Addendum Note (Signed)
Addended by: Lianne Cure A on: 01/15/2017 02:58 PM   Modules accepted: Orders

## 2017-03-02 DIAGNOSIS — F909 Attention-deficit hyperactivity disorder, unspecified type: Secondary | ICD-10-CM | POA: Diagnosis not present

## 2017-03-02 DIAGNOSIS — M25511 Pain in right shoulder: Secondary | ICD-10-CM | POA: Diagnosis not present

## 2017-06-08 DIAGNOSIS — F3342 Major depressive disorder, recurrent, in full remission: Secondary | ICD-10-CM | POA: Diagnosis not present

## 2017-06-08 DIAGNOSIS — Z0000 Encounter for general adult medical examination without abnormal findings: Secondary | ICD-10-CM | POA: Diagnosis not present

## 2017-06-08 DIAGNOSIS — Z1159 Encounter for screening for other viral diseases: Secondary | ICD-10-CM | POA: Diagnosis not present

## 2017-06-08 DIAGNOSIS — I10 Essential (primary) hypertension: Secondary | ICD-10-CM | POA: Diagnosis not present

## 2017-06-08 DIAGNOSIS — Z125 Encounter for screening for malignant neoplasm of prostate: Secondary | ICD-10-CM | POA: Diagnosis not present

## 2017-06-08 DIAGNOSIS — Z23 Encounter for immunization: Secondary | ICD-10-CM | POA: Diagnosis not present

## 2017-06-08 DIAGNOSIS — Z720 Tobacco use: Secondary | ICD-10-CM | POA: Diagnosis not present

## 2017-06-08 DIAGNOSIS — E785 Hyperlipidemia, unspecified: Secondary | ICD-10-CM | POA: Diagnosis not present

## 2017-06-08 DIAGNOSIS — F909 Attention-deficit hyperactivity disorder, unspecified type: Secondary | ICD-10-CM | POA: Diagnosis not present

## 2017-07-02 ENCOUNTER — Ambulatory Visit (INDEPENDENT_AMBULATORY_CARE_PROVIDER_SITE_OTHER): Payer: Medicare HMO | Admitting: Orthopedic Surgery

## 2017-07-02 ENCOUNTER — Ambulatory Visit (INDEPENDENT_AMBULATORY_CARE_PROVIDER_SITE_OTHER): Payer: Medicare HMO

## 2017-07-02 ENCOUNTER — Encounter (INDEPENDENT_AMBULATORY_CARE_PROVIDER_SITE_OTHER): Payer: Self-pay | Admitting: Orthopedic Surgery

## 2017-07-02 DIAGNOSIS — G8929 Other chronic pain: Secondary | ICD-10-CM

## 2017-07-02 DIAGNOSIS — M25511 Pain in right shoulder: Secondary | ICD-10-CM | POA: Diagnosis not present

## 2017-07-02 MED ORDER — IBUPROFEN 600 MG PO TABS: 600.0000 mg | ORAL_TABLET | Freq: Two times a day (BID) | ORAL | 0 refills | Status: DC | PRN

## 2017-07-03 NOTE — Progress Notes (Signed)
Office Visit Note   Patient: Steven Hayden           Date of Birth: 1950-10-16           MRN: [id number] Visit Date: 07/02/2017 Requested by: Jonathon Jordan, MD [place], [place] [address] PCP: Maurice Small, MD  Subjective: Chief Complaint  Patient presents with  . Right Shoulder - Pain    HPI: Steven Hayden is a 66 year old patient with right shoulder pain.  His chief complaint today is "my rotator cuff is torn".  He reports constant pain in the shoulder region in the deltoid area.  Denies any history of injury.  Reports some radicular pain but no numbness and tingling.  He has had pain for 6 months.  The pain will wake him from sleep at night.  He states that the shoulder feels bruised all the time.  He is a retired Hotel manager but likes to Merchandiser, retail.  Denies any neck pain.              ROS: All systems reviewed are negative as they relate to the chief complaint within the history of present illness.  Patient denies  fevers or chills.   Assessment & Plan: Visit Diagnoses:  1. Chronic right shoulder pain     Plan: Impression is right shoulder pain with possibility of torn rotator cuff.  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  Based on his symptoms and based on exam he may have a small rotator cuff tear.  Needs ibuprofen 1 twice a day #60 and right shoulder MRI arthrogram to evaluate for rotator cuff tear.  I'll see him back after that study he has failed course of nonoperative treatment including home exercises and over-the-counter anti-inflammatory medication  Follow-Up Instructions: Return for after MRI.   Orders:  Orders Placed This Encounter  Procedures  . XR Shoulder Right  . MR SHOULDER RIGHT W CONTRAST  . Arthrogram   Meds ordered this encounter  Medications  . ibuprofen (ADVIL,MOTRIN) 600 MG tablet    Sig: Take 1 tablet (600 mg total) by mouth 2 (two) times daily as needed.    Dispense:  60 tablet    Refill:  0      Procedures: No procedures performed   Clinical Data: No additional findings.  Objective: Vital Signs: There were no vitals taken for this visit.  Physical Exam:   Constitutional: Patient appears well-developed HEENT:  Head: Normocephalic Eyes:EOM are normal Neck: Normal range of motion Cardiovascular: Normal rate Pulmonary/chest: Effort normal Neurologic: Patient is alert Skin: Skin is warm Psychiatric: Patient has normal mood and affect    Ortho Exam: Orthopedic exam demonstrates good cervical spine range of motion.  Intact motor sensory function in both hands.  Radial pulses intact.  He does have a little bit of coarseness with passive range of motion the right shoulder which may be coming from the acromioclavicular joint or it may be a small rotator cuff tear.  Strength generally is intact to infraspinatus supraspinatus and subscap muscle testing on the right left-hand side.  Impingement signs positive on the right and equivocal on the left.  O'Brien's testing positive on the right negative on the left.  Negative apprehension relocation testing on the right left-hand shoulder.  Specialty Comments:  No specialty comments available.  Imaging: No results found.   PMFS History: Patient Active Problem List   Diagnosis Date Noted  . AAA (abdominal aortic aneurysm) without rupture ([place]) 05/17/2014  . COPD (chronic  obstructive pulmonary disease) ([place]) 09/16/2013  . TOBACCO USE 07/14/2008  . ADD 05/13/2007  . HYPERTENSION 05/13/2007  . HYPERLIPIDEMIA 06/15/2006   Past Medical History:  Diagnosis Date  . AAA (abdominal aortic aneurysm) ([place])   . ADD (attention deficit disorder)   . Atrial fibrillation ([place])   . COPD (chronic obstructive pulmonary disease) ([place])   . Hyperlipidemia   . Hypertension   . Tobacco abuse     Family History  Problem Relation Age of Onset  . Hypertension Brother   . Hyperlipidemia Brother   . Hypertension Father   . Hyperlipidemia Father     . Cancer Father     Past Surgical History:  Procedure Laterality Date  . ABDOMINAL AORTIC ANEURYSM REPAIR    . ABDOMINAL AORTIC ENDOVASCULAR STENT GRAFT N/A 05/18/2014   Procedure: ABDOMINAL AORTIC ENDOVASCULAR STENT GRAFT;  Surgeon: Conrad [place], MD;  Location: [facility];  Service: Vascular;  Laterality: N/A;  . KNEE SURGERY     bilateral   Social History   Occupational History  . Not on file.   Social History Main Topics  . Smoking status: Current Every Day Smoker    Packs/day: 1.00    Years: 50.00    Types: Cigarettes  . Smokeless tobacco: Never Used  . Alcohol use 12.0 oz/week    20 Shots of liquor per week  . Drug use: No  . Sexual activity: Not on file

## 2017-10-26 DIAGNOSIS — I10 Essential (primary) hypertension: Secondary | ICD-10-CM | POA: Diagnosis not present

## 2017-10-26 DIAGNOSIS — E785 Hyperlipidemia, unspecified: Secondary | ICD-10-CM | POA: Diagnosis not present

## 2017-10-26 DIAGNOSIS — F909 Attention-deficit hyperactivity disorder, unspecified type: Secondary | ICD-10-CM | POA: Diagnosis not present

## 2017-10-26 DIAGNOSIS — E291 Testicular hypofunction: Secondary | ICD-10-CM | POA: Diagnosis not present

## 2017-10-26 DIAGNOSIS — J449 Chronic obstructive pulmonary disease, unspecified: Secondary | ICD-10-CM | POA: Diagnosis not present

## 2018-01-18 ENCOUNTER — Ambulatory Visit (HOSPITAL_COMMUNITY): Payer: Medicare HMO | Attending: Family

## 2018-01-18 ENCOUNTER — Ambulatory Visit: Payer: Medicare HMO | Admitting: Family

## 2018-04-19 DIAGNOSIS — I10 Essential (primary) hypertension: Secondary | ICD-10-CM | POA: Diagnosis not present

## 2018-04-19 DIAGNOSIS — J449 Chronic obstructive pulmonary disease, unspecified: Secondary | ICD-10-CM | POA: Diagnosis not present

## 2018-04-19 DIAGNOSIS — R5382 Chronic fatigue, unspecified: Secondary | ICD-10-CM | POA: Diagnosis not present

## 2018-04-19 DIAGNOSIS — F3342 Major depressive disorder, recurrent, in full remission: Secondary | ICD-10-CM | POA: Diagnosis not present

## 2018-04-19 DIAGNOSIS — F319 Bipolar disorder, unspecified: Secondary | ICD-10-CM | POA: Diagnosis not present

## 2018-04-19 DIAGNOSIS — E785 Hyperlipidemia, unspecified: Secondary | ICD-10-CM | POA: Diagnosis not present

## 2018-04-19 DIAGNOSIS — E291 Testicular hypofunction: Secondary | ICD-10-CM | POA: Diagnosis not present

## 2018-04-19 DIAGNOSIS — F909 Attention-deficit hyperactivity disorder, unspecified type: Secondary | ICD-10-CM | POA: Diagnosis not present

## 2018-04-19 DIAGNOSIS — F209 Schizophrenia, unspecified: Secondary | ICD-10-CM | POA: Diagnosis not present

## 2018-04-25 ENCOUNTER — Other Ambulatory Visit: Payer: Self-pay

## 2018-05-09 ENCOUNTER — Other Ambulatory Visit (HOSPITAL_COMMUNITY): Payer: Medicare HMO

## 2018-05-09 ENCOUNTER — Ambulatory Visit: Payer: Medicare HMO | Admitting: Family

## 2018-05-13 ENCOUNTER — Emergency Department (HOSPITAL_COMMUNITY)
Admission: EM | Admit: 2018-05-13 | Discharge: 2018-05-13 | Disposition: A | Discharge status: Home / Self Care | Payer: Medicare HMO | Attending: Emergency Medicine | Admitting: Emergency Medicine

## 2018-05-13 ENCOUNTER — Other Ambulatory Visit: Payer: Self-pay

## 2018-05-13 ENCOUNTER — Encounter (HOSPITAL_COMMUNITY): Payer: Self-pay | Admitting: Emergency Medicine

## 2018-05-13 ENCOUNTER — Emergency Department (HOSPITAL_COMMUNITY): Payer: Medicare HMO

## 2018-05-13 DIAGNOSIS — Z5321 Procedure and treatment not carried out due to patient leaving prior to being seen by health care provider: Secondary | ICD-10-CM | POA: Insufficient documentation

## 2018-05-13 DIAGNOSIS — R079 Chest pain, unspecified: Secondary | ICD-10-CM | POA: Diagnosis not present

## 2018-05-13 LAB — BASIC METABOLIC PANEL
Anion gap: 12 (ref 5–15)
BUN: 13 mg/dL (ref 8–23)
CALCIUM: 8.9 mg/dL (ref 8.9–10.3)
CO2: 25 mmol/L (ref 22–32)
CREATININE: 1.29 mg/dL — AB (ref 0.61–1.24)
Chloride: 102 mmol/L (ref 98–111)
GFR calc non Af Amer: 56 mL/min — ABNORMAL LOW (ref >60)
GLUCOSE: 99 mg/dL (ref 70–99)
Potassium: 4 mmol/L (ref 3.5–5.1)
Sodium: 139 mmol/L (ref 135–145)

## 2018-05-13 LAB — CBC
HCT: 49.3 % (ref 39.0–52.0)
Hemoglobin: 16.3 g/dL (ref 13.0–17.0)
MCH: 32.4 pg (ref 26.0–34.0)
MCHC: 33.1 g/dL (ref 30.0–36.0)
MCV: 98 fL (ref 78.0–100.0)
PLATELETS: 194 10*3/uL (ref 150–400)
RBC: 5.03 MIL/uL (ref 4.22–5.81)
RDW: 13.2 % (ref 11.5–15.5)
WBC: 8.6 10*3/uL (ref 4.0–10.5)

## 2018-05-13 LAB — I-STAT TROPONIN, ED: Troponin i, poc: 0.02 ng/mL (ref 0.00–0.08)

## 2018-05-13 NOTE — ED Triage Notes (Signed)
Pt arrives via POV from home with 1 week hx of right sided CP. States has been feeling weak and sweaty. Also c/o wheezing, cough, COPD flare. Denies heart hx.

## 2018-05-13 NOTE — ED Notes (Signed)
Pt was called for vitals to be re-checked. No response.

## 2018-05-13 NOTE — ED Notes (Signed)
Pt states he doesn't want to wait any longer is feeling better. Seen leaving ED with steady gait.

## 2018-05-14 ENCOUNTER — Encounter: Payer: Self-pay | Admitting: Family

## 2018-05-14 ENCOUNTER — Ambulatory Visit: Payer: Medicare HMO | Admitting: Family

## 2018-05-14 ENCOUNTER — Ambulatory Visit (HOSPITAL_COMMUNITY): Admission: RE | Admit: 2018-05-14 | Payer: Medicare HMO | Source: Ambulatory Visit

## 2018-05-15 DIAGNOSIS — R079 Chest pain, unspecified: Secondary | ICD-10-CM | POA: Diagnosis not present

## 2018-05-15 DIAGNOSIS — J441 Chronic obstructive pulmonary disease with (acute) exacerbation: Secondary | ICD-10-CM | POA: Diagnosis not present

## 2018-09-13 DIAGNOSIS — M940 Chondrocostal junction syndrome [Tietze]: Secondary | ICD-10-CM | POA: Diagnosis not present

## 2018-09-13 DIAGNOSIS — F909 Attention-deficit hyperactivity disorder, unspecified type: Secondary | ICD-10-CM | POA: Diagnosis not present

## 2018-09-13 DIAGNOSIS — Z9889 Other specified postprocedural states: Secondary | ICD-10-CM | POA: Diagnosis not present

## 2018-09-13 DIAGNOSIS — R079 Chest pain, unspecified: Secondary | ICD-10-CM | POA: Diagnosis not present

## 2018-09-13 DIAGNOSIS — Z23 Encounter for immunization: Secondary | ICD-10-CM | POA: Diagnosis not present

## 2018-09-16 ENCOUNTER — Other Ambulatory Visit: Payer: Self-pay | Admitting: Family Medicine

## 2018-09-16 DIAGNOSIS — Z9889 Other specified postprocedural states: Secondary | ICD-10-CM

## 2018-10-23 DIAGNOSIS — F209 Schizophrenia, unspecified: Secondary | ICD-10-CM | POA: Diagnosis not present

## 2018-10-23 DIAGNOSIS — F319 Bipolar disorder, unspecified: Secondary | ICD-10-CM | POA: Diagnosis not present

## 2018-10-23 DIAGNOSIS — Z1159 Encounter for screening for other viral diseases: Secondary | ICD-10-CM | POA: Diagnosis not present

## 2018-10-23 DIAGNOSIS — I10 Essential (primary) hypertension: Secondary | ICD-10-CM | POA: Diagnosis not present

## 2018-10-23 DIAGNOSIS — Z720 Tobacco use: Secondary | ICD-10-CM | POA: Diagnosis not present

## 2018-10-23 DIAGNOSIS — Z125 Encounter for screening for malignant neoplasm of prostate: Secondary | ICD-10-CM | POA: Diagnosis not present

## 2018-10-23 DIAGNOSIS — Z0000 Encounter for general adult medical examination without abnormal findings: Secondary | ICD-10-CM | POA: Diagnosis not present

## 2018-10-23 DIAGNOSIS — E785 Hyperlipidemia, unspecified: Secondary | ICD-10-CM | POA: Diagnosis not present

## 2018-11-23 DIAGNOSIS — R52 Pain, unspecified: Secondary | ICD-10-CM | POA: Diagnosis not present

## 2018-11-23 DIAGNOSIS — R61 Generalized hyperhidrosis: Secondary | ICD-10-CM | POA: Diagnosis not present

## 2018-11-23 DIAGNOSIS — M25519 Pain in unspecified shoulder: Secondary | ICD-10-CM | POA: Diagnosis not present

## 2019-02-18 DIAGNOSIS — Z20828 Contact with and (suspected) exposure to other viral communicable diseases: Secondary | ICD-10-CM | POA: Diagnosis not present

## 2019-03-26 DIAGNOSIS — H2513 Age-related nuclear cataract, bilateral: Secondary | ICD-10-CM | POA: Diagnosis not present

## 2019-03-26 DIAGNOSIS — H35013 Changes in retinal vascular appearance, bilateral: Secondary | ICD-10-CM | POA: Diagnosis not present

## 2019-03-26 DIAGNOSIS — H31012 Macula scars of posterior pole (postinflammatory) (post-traumatic), left eye: Secondary | ICD-10-CM | POA: Diagnosis not present

## 2019-03-26 DIAGNOSIS — H35371 Puckering of macula, right eye: Secondary | ICD-10-CM | POA: Diagnosis not present

## 2019-05-06 DIAGNOSIS — R55 Syncope and collapse: Secondary | ICD-10-CM | POA: Diagnosis not present

## 2019-05-07 ENCOUNTER — Other Ambulatory Visit (HOSPITAL_COMMUNITY): Payer: Self-pay | Admitting: Family Medicine

## 2019-05-07 DIAGNOSIS — R55 Syncope and collapse: Secondary | ICD-10-CM

## 2019-05-08 DIAGNOSIS — Z20828 Contact with and (suspected) exposure to other viral communicable diseases: Secondary | ICD-10-CM | POA: Diagnosis not present

## 2019-05-13 ENCOUNTER — Other Ambulatory Visit: Payer: Self-pay | Admitting: Family Medicine

## 2019-05-13 DIAGNOSIS — R55 Syncope and collapse: Secondary | ICD-10-CM

## 2019-05-15 ENCOUNTER — Encounter (HOSPITAL_COMMUNITY): Payer: Self-pay | Admitting: Family Medicine

## 2019-05-20 ENCOUNTER — Ambulatory Visit
Admission: RE | Admit: 2019-05-20 | Discharge: 2019-05-20 | Disposition: A | Discharge status: Home / Self Care | Payer: Medicare HMO | Source: Ambulatory Visit | Attending: Family Medicine | Admitting: Family Medicine

## 2019-05-20 DIAGNOSIS — R55 Syncope and collapse: Secondary | ICD-10-CM | POA: Diagnosis not present

## 2019-05-20 DIAGNOSIS — I6523 Occlusion and stenosis of bilateral carotid arteries: Secondary | ICD-10-CM | POA: Diagnosis not present

## 2019-05-22 ENCOUNTER — Ambulatory Visit (HOSPITAL_COMMUNITY): Payer: Medicare HMO | Attending: Cardiovascular Disease

## 2019-05-22 ENCOUNTER — Other Ambulatory Visit: Payer: Self-pay

## 2019-05-22 DIAGNOSIS — R55 Syncope and collapse: Secondary | ICD-10-CM | POA: Diagnosis not present

## 2019-05-22 NOTE — Progress Notes (Unsigned)
Patient did not consent to the use of Definity.

## 2019-11-08 ENCOUNTER — Ambulatory Visit: Payer: Medicare HMO | Attending: Internal Medicine

## 2019-11-08 DIAGNOSIS — Z23 Encounter for immunization: Secondary | ICD-10-CM | POA: Insufficient documentation

## 2019-11-08 NOTE — Progress Notes (Signed)
   Covid-19 Vaccination Clinic  Name:  Steven Hayden    MRN: [id number] DOB: [birth year]  11/08/2019  Steven Hayden was observed post Covid-19 immunization for 15 minutes without incident. He was provided with Vaccine Information Sheet and instruction to access the V-Safe system.   Steven Hayden was instructed to call 911 with any severe reactions post vaccine: Marland Kitchen Difficulty breathing  . Swelling of face and throat  . A fast heartbeat  . A bad rash all over body  . Dizziness and weakness   Immunizations Administered    Name Date Dose VIS Date Route   Pfizer COVID-19 Vaccine 11/08/2019  2:56 PM 0.3 mL 08/15/2019 Intramuscular   Manufacturer: [place]   Lot: [id number]   [place]: [id number]

## 2019-12-09 ENCOUNTER — Ambulatory Visit: Payer: Medicare HMO

## 2019-12-10 ENCOUNTER — Telehealth: Payer: Self-pay | Admitting: Internal Medicine

## 2019-12-10 NOTE — Telephone Encounter (Signed)
Left message for pt about following infor  Per vickie- NEED documentation of who have been prescribing adderall and when was his Diagnosis of this?? Does he see  A psychiatrist? Vickie needs documentation before being seen or he would possibly need to reschedule appt. No guartnee that she can keep him on med

## 2019-12-10 NOTE — Telephone Encounter (Signed)
Pt used to see Maurice Small. Looks like it might be in care everywhere but until pt is seen, vickie can not access records. He said Bruce Swords back in 2010 when at [facility] Dx him with this and then Maurice Small took over in 2018.

## 2019-12-11 NOTE — Telephone Encounter (Signed)
Pt was advised to come at 10:15am to fill out new patient paperwork

## 2019-12-11 NOTE — Telephone Encounter (Signed)
Reviewed chart, found notes from Dr Leanne Chang re: ADHD.    Called Dr. Justin Mend at [facility] and t/w [facility], she sent over last cpe and orig info for ADHD when they began with patient.  I talked with Sherlon Handing with Medical records she will try and obtain original test regarding ADHD.    Advised same to [facility].

## 2019-12-15 ENCOUNTER — Other Ambulatory Visit: Payer: Self-pay

## 2019-12-15 ENCOUNTER — Ambulatory Visit (INDEPENDENT_AMBULATORY_CARE_PROVIDER_SITE_OTHER): Payer: Medicare HMO | Admitting: Family Medicine

## 2019-12-15 ENCOUNTER — Encounter: Payer: Self-pay | Admitting: Family Medicine

## 2019-12-15 VITALS — BP 124/80 | HR 78 | Temp 97.8°F | Resp 22 | Ht 70.5 in | Wt 274.8 lb

## 2019-12-15 DIAGNOSIS — F209 Schizophrenia, unspecified: Secondary | ICD-10-CM

## 2019-12-15 DIAGNOSIS — J449 Chronic obstructive pulmonary disease, unspecified: Secondary | ICD-10-CM | POA: Diagnosis not present

## 2019-12-15 DIAGNOSIS — R0902 Hypoxemia: Secondary | ICD-10-CM

## 2019-12-15 DIAGNOSIS — L853 Xerosis cutis: Secondary | ICD-10-CM

## 2019-12-15 DIAGNOSIS — R440 Auditory hallucinations: Secondary | ICD-10-CM | POA: Diagnosis not present

## 2019-12-15 DIAGNOSIS — I714 Abdominal aortic aneurysm, without rupture, unspecified: Secondary | ICD-10-CM

## 2019-12-15 DIAGNOSIS — F17200 Nicotine dependence, unspecified, uncomplicated: Secondary | ICD-10-CM

## 2019-12-15 DIAGNOSIS — I10 Essential (primary) hypertension: Secondary | ICD-10-CM

## 2019-12-15 DIAGNOSIS — R252 Cramp and spasm: Secondary | ICD-10-CM

## 2019-12-15 DIAGNOSIS — F988 Other specified behavioral and emotional disorders with onset usually occurring in childhood and adolescence: Secondary | ICD-10-CM | POA: Diagnosis not present

## 2019-12-15 DIAGNOSIS — E785 Hyperlipidemia, unspecified: Secondary | ICD-10-CM

## 2019-12-15 MED ORDER — AMPHETAMINE-DEXTROAMPHETAMINE 30 MG PO TABS: 30.0000 mg | ORAL_TABLET | Freq: Every day | ORAL | 0 refills | Status: DC

## 2019-12-15 NOTE — Progress Notes (Signed)
Subjective:    Patient ID: Steven Hayden, male    DOB: 1951/08/24, 69 y.o.   MRN: [id number]  HPI Chief Complaint  Patient presents with  . new pt    new pt get established, needs refill adderall ran out on the 6th but took it every other day so he wouldn't run out   He is new to the practice and here to establish care.  Previous medical care: [facility]   Other providers: Vascular surgeon/cardiologist- Dr. Bridgett Larsson   AAA- closely followed by Dr. Adair Laundry he is overdue for follow up. Plans to call and schedule.   HTN- diagnosed several years ago. States it has been controlled with his current medications.   COPD- states he was diagnosed by his previous PCP and has never seen pulmonology. Denies ever having PFTs  States he bought a pulse oximeter and his oxygen level at home has dropped as low as 78% states he was given oxygen by a friend and he uses it sometimes at home. States he has never been prescribed oxygen.  Reports Spiriva does not work as well as Youth worker. He has used his fathers Anoro and would like to take this instead.  Using albuterol inhaler 2-3 times per day.   He smokes 1ppd.  Is not ready to stop.   Statins cannot tolerate statins, has tried at least 2 different ones. Atorvastatin and pravastatin.   Reports having leg cramps, usually in early morning. Started taking magnesium maleate and this helps.    ADHD- history of this for many years. States he cannot function without his medication. He is taking Adderall 30 mg in the morning. Lasts until the afternoon.  Can't sleep if he takes it late in morning. Sleeping well currently and appetite is good.  States he used to take Concerta which worked very well but it was not affordable.  I have documentation from his previous PCPs regarding his ADHD. I also can see that he has been prescribed what he reports on a regular basis.   Reports history of bipolar and schizophrenia but has never been on medication for this. Stats  he has been to a psychiatrist in the past but not lately.  States he hears voices but they never tell him to do "anyting bad". States he manages fine.  No SI or HI.    Divorced. Retired. Worked as Musician for [employer] -would like to see dermatology.   Denies fever, chills, dizziness, chest pain, palpitations, orthopnea, cough, abdominal pain, N/V/D, urinary symptoms, LE edema.   Reviewed allergies, medications, past medical, surgical, family, and social history.   Review of Systems Pertinent positives and negatives in the history of present illness.     Objective:   Physical Exam Constitutional:      General: He is not in acute distress.    Appearance: He is not ill-appearing.  Eyes:     Extraocular Movements: Extraocular movements intact.     Conjunctiva/sclera: Conjunctivae normal.  Cardiovascular:     Rate and Rhythm: Normal rate and regular rhythm.     Pulses: Normal pulses.     Heart sounds: Normal heart sounds.  Pulmonary:     Effort: No respiratory distress.     Breath sounds: Examination of the right-lower field reveals wheezing. Examination of the left-lower field reveals wheezing. Wheezing present. No rhonchi.  Abdominal:     General: There is no distension.     Tenderness: There is no abdominal tenderness.  Musculoskeletal:     Cervical back: Normal range of motion and neck supple.     Right lower leg: No edema.     Left lower leg: No edema.  Skin:    General: Skin is warm and dry.     Capillary Refill: Capillary refill takes less than 2 seconds.  Neurological:     General: No focal deficit present.     Mental Status: He is alert and oriented to person, place, and time.     Motor: No weakness.     Coordination: Coordination normal.     Gait: Gait normal.  Psychiatric:        Attention and Perception: Attention normal. He perceives auditory hallucinations.        Mood and Affect: Mood normal.        Speech: Speech normal.         Behavior: Behavior normal.        Thought Content: Thought content normal.    BP 124/80   Pulse 78   Temp 97.8 F (36.6 C)   Resp (!) 22   Ht 5' 10.5" (1.791 m)   Wt 274 lb 12.8 oz (124.6 kg)   SpO2 91%   BMI 38.87 kg/m       Assessment & Plan:  Attention deficit disorder, unspecified hyperactivity presence - Plan: amphetamine-dextroamphetamine (ADDERALL) 30 MG tablet -reviewed records from previous PCPs Dr. Maurice Small and Dr. Phoebe Sharps regarding his ADHD diagnosis and treatment. He was diagnosed as an adult but recalls symptoms as a child. All siblings had ADHD.  He apparently cannot function without medication.  I plan to refer him to a psychiatrist in the near future due to this and history of Bipolar and schizophrenia. No current psychosis  Essential hypertension -BP is in goal range. Continue current medication   TOBACCO USE -recommended that he stop smoking. No plan currently  Chronic obstructive pulmonary disease, unspecified COPD type ([place]) - Plan: Ambulatory referral to Pulmonology -Declines chest XR Declines labs  Oxygen level improved to 91%. He is not in acute distress. He has been self medicating with his father's Anoro and a friend's oxygen machine.  I will give him Anoro samples and have him stop Spiriva. Use albuterol as needed.  Urgent referral to pulmonology  Leg cramps -declines labs  Auditory hallucinations -states these are not bothersome  Abdominal aortic aneurysm (AAA) without rupture ([place]) -followed by Dr. Bridgett Larsson. He will call and schedule. I provided him with the phone number and office information   Dry skin- discussed using a good moisturizer. He will also call and schedule with Dr. Ledell Peoples office for this problem and numerous skin tags/   Hypoxemia - Plan: Ambulatory referral to Pulmonology Declines chest XR Declines labs  Oxygen level improved to 91%. He is not in acute distress. He has been self medicating with his father's Anoro and a  friend's oxygen machine.  I will give him Anoro samples and have him stop Spiriva. Use albuterol as needed.  Urgent referral to pulmonology

## 2019-12-15 NOTE — Patient Instructions (Addendum)
I am referring you to the pulmonologist for further evaluation of your shortness of breath and COPD.  They will call you to schedule an appointment.  I recommend that you call and schedule an appointment with Dr. Ledell Peoples office. Dermatologist   [facility]: Phone #: [phone] Address: [address], [place], [place] [address]  Call and schedule a visit with Dr. Lianne Moris office. [phone]   Follow up with me in 4 weeks.

## 2019-12-16 ENCOUNTER — Encounter: Payer: Self-pay | Admitting: Family Medicine

## 2019-12-16 DIAGNOSIS — E785 Hyperlipidemia, unspecified: Secondary | ICD-10-CM | POA: Insufficient documentation

## 2019-12-16 DIAGNOSIS — F209 Schizophrenia, unspecified: Secondary | ICD-10-CM | POA: Insufficient documentation

## 2020-01-07 DIAGNOSIS — L820 Inflamed seborrheic keratosis: Secondary | ICD-10-CM | POA: Diagnosis not present

## 2020-01-07 DIAGNOSIS — L918 Other hypertrophic disorders of the skin: Secondary | ICD-10-CM | POA: Diagnosis not present

## 2020-01-07 DIAGNOSIS — L570 Actinic keratosis: Secondary | ICD-10-CM | POA: Diagnosis not present

## 2020-01-07 DIAGNOSIS — L300 Nummular dermatitis: Secondary | ICD-10-CM | POA: Diagnosis not present

## 2020-01-07 DIAGNOSIS — B079 Viral wart, unspecified: Secondary | ICD-10-CM | POA: Diagnosis not present

## 2020-01-07 DIAGNOSIS — D485 Neoplasm of uncertain behavior of skin: Secondary | ICD-10-CM | POA: Diagnosis not present

## 2020-01-08 ENCOUNTER — Other Ambulatory Visit: Payer: Self-pay

## 2020-01-08 ENCOUNTER — Ambulatory Visit: Payer: Medicare HMO | Admitting: Internal Medicine

## 2020-01-08 ENCOUNTER — Encounter: Payer: Self-pay | Admitting: Internal Medicine

## 2020-01-08 ENCOUNTER — Telehealth: Payer: Self-pay | Admitting: Internal Medicine

## 2020-01-08 VITALS — BP 126/70 | HR 87 | Temp 97.9°F | Ht 70.5 in | Wt 268.0 lb

## 2020-01-08 DIAGNOSIS — J449 Chronic obstructive pulmonary disease, unspecified: Secondary | ICD-10-CM

## 2020-01-08 DIAGNOSIS — R0602 Shortness of breath: Secondary | ICD-10-CM | POA: Diagnosis not present

## 2020-01-08 DIAGNOSIS — R0600 Dyspnea, unspecified: Secondary | ICD-10-CM | POA: Diagnosis not present

## 2020-01-08 DIAGNOSIS — R0609 Other forms of dyspnea: Secondary | ICD-10-CM

## 2020-01-08 NOTE — Progress Notes (Signed)
OV 01/08/2020  Subjective:  Patient ID: Steven Hayden, male , DOB: 1951/05/01 , age 69 y.o. , MRN: [id number] , ADDRESS: [address] [place] [id number]   01/08/2020 -   Chief Complaint  Patient presents with  . Consult    Pt is being referred by Steven. Raenette Rover due to SOB. Pt states the SOB is with exertion. Denies any complaints of cough or chest discomfort.     HPI Steven Hayden 69 y.o. - has a past medical history of AAA (abdominal aortic aneurysm) ([place]), ADD (attention deficit disorder), Atrial fibrillation ([place]), Bipolar affective disorder ([place]), COPD (chronic obstructive pulmonary disease) ([place]), Hyperlipidemia, Hypertension, Schizophrenia ([place]), and Tobacco abuse.     reports that he has been smoking cigarettes. He has a 125.00 pack-year smoking history. He has never used smokeless tobacco.   Presenting for evaluation of shortness of breath.  Denies any cough or sputum production at this time.  Reports almost every winter he gets a cough and increased sputum production.  Currently on Spiriva.  Reports using a sample of Anoro once which greatly improved his breathing. He has never had PFT's. Denies any cardiac history or chest pain. He gets SOB very easy, but he does not let this limit his activity. Continues to smoke. He has tried to quit multiple times using multiple interventions but has been unsuccessful to this point.  Reports he breathes much better when visiting his mother at the beach. He lives in his parents home and reports living there most of his life. He is not aware of any mold,no pets in the home, and no carpet. He was sent for  sleep study 20 years ago ,but could not tolerate the CPAP to finish the test.   Review of Systems  Constitutional: Negative for chills and fever.  HENT: Negative for congestion and sinus pain.   Eyes: Negative for pain and discharge.  Respiratory: Negative for cough and sputum production.   Cardiovascular: Negative for chest pain and  palpitations.  Gastrointestinal: Negative for nausea and vomiting.  Genitourinary: Negative for dysuria and urgency.  Musculoskeletal: Negative for falls and myalgias.  Skin: Negative for itching and rash.      Past Surgical History:  Procedure Laterality Date  . ABDOMINAL AORTIC ANEURYSM REPAIR    . ABDOMINAL AORTIC ENDOVASCULAR STENT GRAFT N/A 05/18/2014   Procedure: ABDOMINAL AORTIC ENDOVASCULAR STENT GRAFT;  Surgeon: Conrad Mulberry, MD;  Location: [facility] OR;  Service: Vascular;  Laterality: N/A;  . KNEE SURGERY     bilateral    Allergies  Allergen Reactions  . Codeine Phosphate     REACTION: swelling of uvula, hives  . Dilaudid [Hydromorphone]     Immunization History  Administered Date(s) Administered  . Influenza Whole 06/07/2010  . Influenza,inj,Quad PF,6+ Mos 06/22/2014  . PFIZER SARS-COV-2 Vaccination 11/08/2019    Family History  Problem Relation Age of Onset  . Hypertension Brother   . Hyperlipidemia Brother   . Hypertension Father   . Hyperlipidemia Father   . Cancer Father      Current Outpatient Medications:  .  albuterol (VENTOLIN HFA) 108 (90 Base) MCG/ACT inhaler, Inhale 2 puffs into the lungs every 6 (six) hours as needed for wheezing or shortness of breath., Disp: , Rfl:  .  amphetamine-dextroamphetamine (ADDERALL) 30 MG tablet, Take 1 tablet by mouth daily., Disp: 30 tablet, Rfl: 0 .  ipratropium-albuterol (DUONEB) 0.5-2.5 (3) MG/3ML SOLN, , Disp: , Rfl:  .  lisinopril-hydrochlorothiazide (PRINZIDE,ZESTORETIC) 20-25 MG per tablet,  TAKE TWO TABLETS BY MOUTH DAILY, Disp: 60 tablet, Rfl: 2 .  tiotropium (SPIRIVA HANDIHALER) 18 MCG inhalation capsule, Place 1 capsule (18 mcg total) into inhaler and inhale daily., Disp: 90 capsule, Rfl: 3 .  triamcinolone ointment (KENALOG) 0.1 %, SMARTSIG:1 Sparingly Topical Twice Daily, Disp: , Rfl:       Objective:   Vitals:   01/08/20 1451  BP: 126/70  Pulse: 87  Temp: 97.9 F (36.6 C)  TempSrc: Temporal  SpO2:  94%  Weight: 268 lb (121.6 kg)  Height: 5' 10.5" (1.791 m)    Estimated body mass index is 37.91 kg/m as calculated from the following:   Height as of this encounter: 5' 10.5" (1.791 m).   Weight as of this encounter: 268 lb (121.6 kg).   Filed Weights   01/08/20 1451  Weight: 268 lb (121.6 kg)     Physical Exam  General: NAD, obese HE: Normocephalic, atraumatic , EOMI, Conjunctivae normal ENT: No congestion, no rhinorrhea, no exudate or erythema  Cardiovascular: Normal rate, regular rhythm.  No murmurs, rubs, or gallops Pulmonary : Effort normal, distant breath sounds bilaterally. No wheezes, rales, or rhonchi Abdominal: soft, nontender,  bowel sounds present Musculoskeletal: no swelling , deformity, injury ,or tenderness in extremities, Skin: Warm, dry , no bruising, erythema, or rash Psychiatric/Behavioral:  normal mood, normal behavior      Assessment:    Patient only tolerated 3/6 minutes of the walk test. He desaturating down to 91% at 3 minutes. He likely has at [place] moderate lung disease , component of deconditioning. Also has diastolic heart dysfunction which could contribute to patient sensation of shortness of breath. The patient has a BMI of 37, history of snoring, and I expect obstructive sleep apnea.    His last lab work shows elevated Creatinine . Patient reports recent blood work was done with [employer].  Patient has a cough sometimes. Will defer to PCP to consider calcium channel blocker or Angio receptor blocker.       Plan:  -PFT's -High resolution CT Scan - Referral for sleep study    Patient Instructions  Chronic obstructive pulmonary disease, unspecified COPD type (HCC)  Dyspnea on exertion  1. Dyspnea / Chronic obstructive pulmonary disease, unspecified COPD type ([place])     - PFT's     - High Resolution CT Scan     - Referral to sleep study   Follow up with NP by telephone in 2 weeks to review results             SIGNATURE   Tamsen Snider, MD PGY1     ATTESTATION & SIGNATURE   STAFF NOTE: I, Steven Hayden have personally reviewed patient's available data, including medical history, events of note, physical examination and test results as part of my evaluation. I have discussed with resident/NP and other care providers such as pharmacist, RN and RRT.  In addition,  I personally evaluated patient and elicited key findings of   S: Date of admit (Not on file) with LOS 0 for today 01/08/2020 : Steven Hayden is  -125 pack smoking history.  Has chronic insidious onset of dyspnea on exertion that is getting worse.  No chest pain.  He has a strong snoring history.  He is also on lisinopril but denies any cough except during respiratory exacerbations.  His stop bang questionnaire for sleep apnea is 8 out of 8.  Some 20 years ago he had sleep apnea test but did not tolerate the  test itself.  Therefore it was incomplete.  Walking desaturation test he got tachycardic and pulse ox went from 98% to 91%.  There was 185 feet x 3 laps on room air.  He did get dyspneic.   has a past medical history of AAA (abdominal aortic aneurysm) ([place]), ADD (attention deficit disorder), Atrial fibrillation ([place]), Bipolar affective disorder ([place]), COPD (chronic obstructive pulmonary disease) ([place]), Hyperlipidemia, Hypertension, Schizophrenia ([place]), and Tobacco abuse.   reports that he has been smoking cigarettes. He has a 125.00 pack-year smoking history. He has never used smokeless tobacco. Immunization History  Administered Date(s) Administered  . Influenza Whole 06/07/2010  . Influenza,inj,Quad PF,6+ Mos 06/22/2014  . PFIZER SARS-COV-2 Vaccination 11/08/2019     O:  Blood pressure 126/70, pulse 87, temperature 97.9 F (36.6 C), temperature source Temporal, height 5' 10.5" (1.791 m), weight 268 lb (121.6 kg), SpO2 94 %.   Morbidly obese male with high level of visceral obesity.  Mallampati class III.  Overall clear to auscultation  bilaterally.   LABS    PULMONARY No results for input(s): PHART, PCO2ART, PO2ART, HCO3, TCO2, O2SAT in the last 168 hours.  Invalid input(s): PCO2, PO2  CBC No results for input(s): HGB, HCT, WBC, PLT in the last 168 hours.  COAGULATION No results for input(s): INR in the last 168 hours.  CARDIAC  No results for input(s): TROPONINI in the last 168 hours. No results for input(s): PROBNP in the last 168 hours.   CHEMISTRY No results for input(s): NA, K, CL, CO2, GLUCOSE, BUN, CREATININE, CALCIUM, MG, PHOS in the last 168 hours. CrCl cannot be calculated (Patient's most recent lab result is older than the maximum 21 days allowed.).   LIVER No results for input(s): AST, ALT, ALKPHOS, BILITOT, PROT, ALBUMIN, INR in the last 168 hours.   INFECTIOUS No results for input(s): LATICACIDVEN, PROCALCITON in the last 168 hours.   ENDOCRINE CBG (last 3)  No results for input(s): GLUCAP in the last 72 hours.       IMAGING x24h  - image(s) personally visualized  -   highlighted in bold No results found.    A:  -Dyspnea on exertion no prior PFTs.  Based on smoking history most likely this is COPD.  Alternatively it could also be ILD.  Definitely visceral obesity and morbid obesity and diastolic dysfunction on echocardiogram playing a role with this with physical deconditioning.  High pretest probability for sleep apnea  P: Get high-resolution CT chest and PFTs Continue Spiriva for now Resident is advised him to talk to his primary care physician and come off lisinopril Refer to local sleep doctor in our practice  Regroup after CT scan and PFT test to discuss results in the neck step forward   Anti-infectives (From admission, onward)   None       Rest per NP/medical resident whose note is outlined above and that I agree with    Steven. Brand Males, M.D., [facility].C.P Pulmonary and Critical Care Medicine Staff Physician [facility]monary and  Critical Care Pager: [phone], If no answer or between  15:00h - 7:00h: call [phone]  01/08/2020 5:02 PM

## 2020-01-08 NOTE — Patient Instructions (Addendum)
Chronic obstructive pulmonary disease, unspecified COPD type (HCC)  Dyspnea on exertion  1. Dyspnea / Chronic obstructive pulmonary disease, unspecified COPD type ([place])     - PFT's     - High Resolution CT Scan     - Referral to sleep study   Follow up with NP by telephone in 2 weeks to review results

## 2020-01-14 ENCOUNTER — Encounter: Payer: Self-pay | Admitting: Family Medicine

## 2020-01-14 ENCOUNTER — Ambulatory Visit (INDEPENDENT_AMBULATORY_CARE_PROVIDER_SITE_OTHER): Payer: Medicare HMO | Admitting: Family Medicine

## 2020-01-14 ENCOUNTER — Other Ambulatory Visit: Payer: Self-pay

## 2020-01-14 VITALS — BP 120/70 | HR 88 | Temp 97.5°F | Wt 271.0 lb

## 2020-01-14 DIAGNOSIS — F988 Other specified behavioral and emotional disorders with onset usually occurring in childhood and adolescence: Secondary | ICD-10-CM

## 2020-01-14 DIAGNOSIS — F17200 Nicotine dependence, unspecified, uncomplicated: Secondary | ICD-10-CM

## 2020-01-14 DIAGNOSIS — J449 Chronic obstructive pulmonary disease, unspecified: Secondary | ICD-10-CM | POA: Diagnosis not present

## 2020-01-14 MED ORDER — AMPHETAMINE-DEXTROAMPHETAMINE 30 MG PO TABS: 30.0000 mg | ORAL_TABLET | Freq: Every day | ORAL | 0 refills | Status: DC

## 2020-01-14 MED ORDER — AMPHETAMINE-DEXTROAMPHET ER 30 MG PO CP24: 30.0000 mg | ORAL_CAPSULE | ORAL | 0 refills | Status: DC

## 2020-01-14 MED ORDER — AMPHETAMINE-DEXTROAMPHET ER 30 MG PO CP24: 30.0000 mg | ORAL_CAPSULE | Freq: Every morning | ORAL | 0 refills | Status: DC

## 2020-01-14 NOTE — Patient Instructions (Signed)
Call and schedule with your cardiologist.

## 2020-01-14 NOTE — Progress Notes (Signed)
   Subjective:    Patient ID: Steven Hayden, male    DOB: 19-Dec-1950, 69 y.o.   MRN: [id number]  HPI Chief Complaint  Patient presents with  . 4 week follow-up    4 week follow-up. still using inhaler    Here for a 4 week follow up on ADD.   States he takes his Adderall early in the morning. Lasts him all day. Appetite is fine. Sleeps well. States his previous PCP, Dr. Leanne Chang, gave him 3 month refills at a time and he would like for me to do this.   States without the medication he is irritable, sluggish and can't function. On the medication he is able to perform his ADLs.   Smoking and no plans to stop.   He saw the dermatologist since our last visit.  He also saw pulmonology and is breathing better with current medication regimen.   No new concerns today.   No fever, chills, headache, dizziness, chest pain, shortness of breath, N/V/D.    Review of Systems Pertinent positives and negatives in the history of present illness.     Objective:   Physical Exam BP 120/70   Pulse 88   Temp (!) 97.5 F (36.4 C)   Wt 271 lb (122.9 kg)   SpO2 93%   BMI 38.33 kg/m   Alert and in no distress. Not otherwise examined.       Assessment & Plan:  Attention deficit disorder, unspecified hyperactivity presence - Plan: amphetamine-dextroamphetamine (ADDERALL) 30 MG tablet, amphetamine-dextroamphetamine (ADDERALL) 30 MG tablet, amphetamine-dextroamphetamine (ADDERALL) 30 MG tablet, DISCONTINUED: amphetamine-dextroamphetamine (ADDERALL XR) 30 MG 24 hr capsule, DISCONTINUED: amphetamine-dextroamphetamine (ADDERALL XR) 30 MG 24 hr capsule, DISCONTINUED: amphetamine-dextroamphetamine (ADDERALL XR) 30 MG 24 hr capsule -PDMP reviewed and no aberrancies. He appears to be doing well on Adderall and without it he is unable to function. I will refill this for him for 3 months.  Discussed that we will do random drug testing at some point. He is ok with this.  Refuses labs today. He is aware that he  has not had blood work done in a year or longer.   Class 2 severe obesity with serious comorbidity in adult, unspecified BMI, unspecified obesity type ([place]) -discussed healthy diet and increasing activity as tolerated.   Chronic obstructive pulmonary disease, unspecified COPD type ([place]) -improving. Not managed by pulmonologist   TOBACCO USE -is aware that he would benefit from stopping but is not willing.

## 2020-01-23 ENCOUNTER — Other Ambulatory Visit: Payer: Medicare HMO

## 2020-01-29 ENCOUNTER — Ambulatory Visit
Admission: RE | Admit: 2020-01-29 | Discharge: 2020-01-29 | Disposition: A | Discharge status: Home / Self Care | Payer: Medicare HMO | Source: Ambulatory Visit | Attending: Internal Medicine | Admitting: Internal Medicine

## 2020-01-29 DIAGNOSIS — R0602 Shortness of breath: Secondary | ICD-10-CM

## 2020-01-29 DIAGNOSIS — J432 Centrilobular emphysema: Secondary | ICD-10-CM | POA: Diagnosis not present

## 2020-01-29 DIAGNOSIS — F17210 Nicotine dependence, cigarettes, uncomplicated: Secondary | ICD-10-CM | POA: Diagnosis not present

## 2020-01-30 ENCOUNTER — Telehealth: Payer: Self-pay | Admitting: Internal Medicine

## 2020-01-30 DIAGNOSIS — R0609 Other forms of dyspnea: Secondary | ICD-10-CM

## 2020-01-30 NOTE — Telephone Encounter (Signed)
Let Sheilah Pigeon know that ct shows emphysema +/- ILD versus atelectasis from not taking deep breath  Plan Serum: ESR, ACE, ANA, DS-DNA, RF, anti-CCP,  , Total CK,  Aldolase,  ENP Panel ( ensure includes -> scl-70, ssA, ssB, and HP panel - get this done next 1-2 weeks and atleast 1 week before he sees TP end of June 2021

## 2020-02-03 NOTE — Telephone Encounter (Signed)
Attempted to call pt but unable to reach. Left message for pt to return call.  I have already ordered all the labs per MR. We just need to tell pt the results of the CT and that he needs to come to get lab work done in the next week. Labs can be done at our office as Benay Spice is back.

## 2020-02-03 NOTE — Telephone Encounter (Signed)
Pt called back. Please return call.  

## 2020-02-03 NOTE — Telephone Encounter (Signed)
Advised pt of results. Pt understood and nothing further is needed.   Pt will come in to get lab work.

## 2020-02-10 ENCOUNTER — Other Ambulatory Visit (INDEPENDENT_AMBULATORY_CARE_PROVIDER_SITE_OTHER): Payer: Medicare HMO

## 2020-02-10 DIAGNOSIS — R0609 Other forms of dyspnea: Secondary | ICD-10-CM

## 2020-02-10 DIAGNOSIS — R0600 Dyspnea, unspecified: Secondary | ICD-10-CM | POA: Diagnosis not present

## 2020-02-10 LAB — SEDIMENTATION RATE: Sed Rate: 16 mm/hr (ref 0–20)

## 2020-02-16 ENCOUNTER — Encounter: Payer: Self-pay | Admitting: Pulmonary Disease

## 2020-02-16 ENCOUNTER — Ambulatory Visit: Payer: Medicare HMO | Admitting: Pulmonary Disease

## 2020-02-16 ENCOUNTER — Other Ambulatory Visit: Payer: Self-pay

## 2020-02-16 VITALS — Temp 97.9°F | Ht 71.0 in | Wt 266.0 lb

## 2020-02-16 DIAGNOSIS — G478 Other sleep disorders: Secondary | ICD-10-CM | POA: Diagnosis not present

## 2020-02-16 DIAGNOSIS — R0683 Snoring: Secondary | ICD-10-CM

## 2020-02-16 DIAGNOSIS — J449 Chronic obstructive pulmonary disease, unspecified: Secondary | ICD-10-CM

## 2020-02-16 NOTE — Progress Notes (Signed)
Steven Hayden    [id number]    May 08, 1951  Primary Care Physician:Henson, Laurian Brim, NP-C  Referring Physician: Girtha Rm, NP-C [place],   [id number]  Chief complaint:   Patient with a history of COPD, dyspnea Being evaluated for obstructive sleep apnea  HPI:  Admits to snoring, difficulty falling asleep usually takes over-the-counter sleep aid to fall asleep He is on Adderall Whenever he takes Adderall beyond 12 midday, is unable to sleep at all He states he sleeps quite well with using over-the-counter sleep aids  Usually will wake up a few times during the night to use the bathroom depending on how much he drinks  Is an active smoker, 1 pack a day  Usually goes to bed about midnight Wake up time about 6 AM  Admits to dry mouth Denies witnessed apneas  Wakes up tired almost on a daily basis  Sleep is nonrestorative   Outpatient Encounter Medications as of 02/16/2020  Medication Sig  . albuterol (VENTOLIN HFA) 108 (90 Base) MCG/ACT inhaler Inhale 2 puffs into the lungs every 6 (six) hours as needed for wheezing or shortness of breath.  . amphetamine-dextroamphetamine (ADDERALL) 30 MG tablet Take 1 tablet by mouth daily.  Marland Kitchen ipratropium-albuterol (DUONEB) 0.5-2.5 (3) MG/3ML SOLN   . lisinopril-hydrochlorothiazide (PRINZIDE,ZESTORETIC) 20-25 MG per tablet TAKE TWO TABLETS BY MOUTH DAILY  . tiotropium (SPIRIVA HANDIHALER) 18 MCG inhalation capsule Place 1 capsule (18 mcg total) into inhaler and inhale daily.  Marland Kitchen triamcinolone ointment (KENALOG) 0.1 % SMARTSIG:1 Sparingly Topical Twice Daily  . [DISCONTINUED] amphetamine-dextroamphetamine (ADDERALL) 30 MG tablet Take 1 tablet by mouth daily.  . [DISCONTINUED] amphetamine-dextroamphetamine (ADDERALL) 30 MG tablet Take 1 tablet by mouth daily.   No facility-administered encounter medications on file as of 02/16/2020.    Allergies as of 02/16/2020 - Review Complete 02/16/2020   Allergen Reaction Noted  . Codeine phosphate    . Dilaudid [hydromorphone]  12/15/2019    Past Medical History:  Diagnosis Date  . AAA (abdominal aortic aneurysm) ([place])   . ADD (attention deficit disorder)   . Atrial fibrillation ([place])   . Bipolar affective disorder ([place])   . COPD (chronic obstructive pulmonary disease) ([place])   . Hyperlipidemia   . Hypertension   . Schizophrenia ([place])   . Tobacco abuse     Past Surgical History:  Procedure Laterality Date  . ABDOMINAL AORTIC ANEURYSM REPAIR    . ABDOMINAL AORTIC ENDOVASCULAR STENT GRAFT N/A 05/18/2014   Procedure: ABDOMINAL AORTIC ENDOVASCULAR STENT GRAFT;  Surgeon: Conrad Troxelville, MD;  Location: [facility] OR;  Service: Vascular;  Laterality: N/A;  . KNEE SURGERY     bilateral    Family History  Problem Relation Age of Onset  . Hypertension Brother   . Hyperlipidemia Brother   . Hypertension Father   . Hyperlipidemia Father   . Cancer Father     Social History   Socioeconomic History  . Marital status: Divorced    Spouse name: Not on file  . Number of children: 1  . Years of education: Not on file  . Highest education level: Not on file  Occupational History  . Not on file  Tobacco Use  . Smoking status: Current Every Day Smoker    Packs/day: 2.50    Years: 50.00    Pack years: 125.00    Types: Cigarettes  . Smokeless tobacco: Never Used  Substance and Sexual Activity  . Alcohol use: Yes  Alcohol/week: 20.0 standard drinks    Types: 20 Shots of liquor per week  . Drug use: No  . Sexual activity: Not on file  Other Topics Concern  . Not on file  Social History Narrative  . Not on file   Social Determinants of Health   Financial Resource Strain:   . Difficulty of Paying Living Expenses:   Food Insecurity:   . Worried About Charity fundraiser in the Last Year:   . Arboriculturist in the Last Year:   Transportation Needs:   . Film/video editor (Medical):   Marland Kitchen Lack of Transportation (Non-Medical):    Physical Activity:   . Days of Exercise per Week:   . Minutes of Exercise per Session:   Stress:   . Feeling of Stress :   Social Connections:   . Frequency of Communication with Friends and Family:   . Frequency of Social Gatherings with Friends and Family:   . Attends Religious Services:   . Active Member of Clubs or Organizations:   . Attends Archivist Meetings:   Marland Kitchen Marital Status:   Intimate Partner Violence:   . Fear of Current or Ex-Partner:   . Emotionally Abused:   Marland Kitchen Physically Abused:   . Sexually Abused:     Review of Systems  HENT: Negative.   Respiratory: Positive for cough and shortness of breath.   Cardiovascular: Negative.     Vitals:   02/16/20 1435  Temp: 97.9 F (36.6 C)     Physical Exam Constitutional:      Appearance: He is obese.  HENT:     Head: Normocephalic and atraumatic.     Nose: No congestion.     Mouth/Throat:     Mouth: Mucous membranes are moist.     Pharynx: No oropharyngeal exudate or posterior oropharyngeal erythema.     Comments: Mallampati 4, crowded oropharynx, macroglossia Eyes:     General:        Right eye: No discharge.        Left eye: No discharge.     Pupils: Pupils are equal, round, and reactive to light.  Cardiovascular:     Rate and Rhythm: Normal rate and regular rhythm.     Pulses: Normal pulses.     Heart sounds: No murmur heard.   Pulmonary:     Effort: Pulmonary effort is normal. No respiratory distress.     Breath sounds: Normal breath sounds. No stridor. No wheezing or rhonchi.  Musculoskeletal:     Cervical back: Normal range of motion and neck supple.  Skin:    General: Skin is warm.  Neurological:     Mental Status: He is alert.  Psychiatric:        Mood and Affect: Mood normal.    Results of the Epworth flowsheet 02/16/2020  Sitting and reading 0  Watching TV 0  Sitting, inactive in a public place (e.g. a theatre or a meeting) 0  As a passenger in a car for an hour without a  break 0  Lying down to rest in the afternoon when circumstances permit 0  Sitting and talking to someone 0  Sitting quietly after a lunch without alcohol 0  In a car, while stopped for a few minutes in traffic 0  Total score 0    Data Reviewed: CT chest 01/29/2020 reviewed   Assessment:  Moderate 40 of significant obstructive sleep apnea  COPD  Sleep apnea  Obesity  Pathophysiology  of sleep disordered breathing discussed with the patient Treatment options for sleep disordered breathing discussed with the patient  Plan/Recommendations: Risks of not treating sleep disordered breathing discussed  We will schedule the patient for home sleep study  We will see him back in the office in about 3 months  Smoking cessation counseling  Graded exercises encouraged   Sherrilyn Rist MD [facility] 02/16/2020, 2:59 PM  CC: Girtha Rm, NP-C

## 2020-02-16 NOTE — Patient Instructions (Signed)
Moderate probability of significant obstructive sleep apnea  Schedule patient for a home sleep study  I will see you in the office back in about 3 months  Call with significant concerns Sleep Apnea Sleep apnea is a condition in which breathing pauses or becomes shallow during sleep. Episodes of sleep apnea usually last 10 seconds or longer, and they may occur as many as 20 times an hour. Sleep apnea disrupts your sleep and keeps your body from getting the rest that it needs. This condition can increase your risk of certain health problems, including:  Heart attack.  Stroke.  Obesity.  Diabetes.  Heart failure.  Irregular heartbeat. What are the causes? There are three kinds of sleep apnea:  Obstructive sleep apnea. This kind is caused by a blocked or collapsed airway.  Central sleep apnea. This kind happens when the part of the brain that controls breathing does not send the correct signals to the muscles that control breathing.  Mixed sleep apnea. This is a combination of obstructive and central sleep apnea. The most common cause of this condition is a collapsed or blocked airway. An airway can collapse or become blocked if:  Your throat muscles are abnormally relaxed.  Your tongue and tonsils are larger than normal.  You are overweight.  Your airway is smaller than normal. What increases the risk? You are more likely to develop this condition if you:  Are overweight.  Smoke.  Have a smaller than normal airway.  Are elderly.  Are male.  Drink alcohol.  Take sedatives or tranquilizers.  Have a family history of sleep apnea. What are the signs or symptoms? Symptoms of this condition include:  Trouble staying asleep.  Daytime sleepiness and tiredness.  Irritability.  Loud snoring.  Morning headaches.  Trouble concentrating.  Forgetfulness.  Decreased interest in sex.  Unexplained sleepiness.  Mood swings.  Personality changes.  Feelings  of depression.  Waking up often during the night to urinate.  Dry mouth.  Sore throat. How is this diagnosed? This condition may be diagnosed with:  A medical history.  A physical exam.  A series of tests that are done while you are sleeping (sleep study). These tests are usually done in a sleep lab, but they may also be done at home. How is this treated? Treatment for this condition aims to restore normal breathing and to ease symptoms during sleep. It may involve managing health issues that can affect breathing, such as high blood pressure or obesity. Treatment may include:  Sleeping on your side.  Using a decongestant if you have nasal congestion.  Avoiding the use of depressants, including alcohol, sedatives, and narcotics.  Losing weight if you are overweight.  Making changes to your diet.  Quitting smoking.  Using a device to open your airway while you sleep, such as: ? An oral appliance. This is a custom-made mouthpiece that shifts your lower jaw forward. ? A continuous positive airway pressure (CPAP) device. This device blows air through a mask when you breathe out (exhale). ? A nasal expiratory positive airway pressure (EPAP) device. This device has valves that you put into each nostril. ? A bi-level positive airway pressure (BPAP) device. This device blows air through a mask when you breathe in (inhale) and breathe out (exhale).  Having surgery if other treatments do not work. During surgery, excess tissue is removed to create a wider airway. It is important to get treatment for sleep apnea. Without treatment, this condition can lead to:  High  blood pressure.  Coronary artery disease.  In men, an inability to achieve or maintain an erection (impotence).  Reduced thinking abilities. Follow these instructions at home: Lifestyle  Make any lifestyle changes that your health care provider recommends.  Eat a healthy, well-balanced diet.  Take steps to lose  weight if you are overweight.  Avoid using depressants, including alcohol, sedatives, and narcotics.  Do not use any products that contain nicotine or tobacco, such as cigarettes, e-cigarettes, and chewing tobacco. If you need help quitting, ask your health care provider. General instructions  Take over-the-counter and prescription medicines only as told by your health care provider.  If you were given a device to open your airway while you sleep, use it only as told by your health care provider.  If you are having surgery, make sure to tell your health care provider you have sleep apnea. You may need to bring your device with you.  Keep all follow-up visits as told by your health care provider. This is important. Contact a health care provider if:  The device that you received to open your airway during sleep is uncomfortable or does not seem to be working.  Your symptoms do not improve.  Your symptoms get worse. Get help right away if:  You develop: ? Chest pain. ? Shortness of breath. ? Discomfort in your back, arms, or stomach.  You have: ? Trouble speaking. ? Weakness on one side of your body. ? Drooping in your face. These symptoms may represent a serious problem that is an emergency. Do not wait to see if the symptoms will go away. Get medical help right away. Call your local emergency services (911 in the [place] Do not drive yourself to the hospital. Summary  Sleep apnea is a condition in which breathing pauses or becomes shallow during sleep.  The most common cause is a collapsed or blocked airway.  The goal of treatment is to restore normal breathing and to ease symptoms during sleep. This information is not intended to replace advice given to you by your health care provider. Make sure you discuss any questions you have with your health care provider. Document Revised: 02/05/2019 Document Reviewed: 04/16/2018 Elsevier Patient Education  [place].

## 2020-02-16 NOTE — Addendum Note (Signed)
Addended by: Satira Sark D on: 02/16/2020 05:09 PM   Modules accepted: Orders

## 2020-02-17 ENCOUNTER — Other Ambulatory Visit (HOSPITAL_COMMUNITY)
Admission: RE | Admit: 2020-02-17 | Discharge: 2020-02-17 | Disposition: A | Discharge status: Home / Self Care | Payer: Medicare HMO | Source: Ambulatory Visit | Attending: Internal Medicine | Admitting: Internal Medicine

## 2020-02-17 ENCOUNTER — Other Ambulatory Visit (HOSPITAL_COMMUNITY): Payer: Medicare HMO

## 2020-02-17 DIAGNOSIS — Z01812 Encounter for preprocedural laboratory examination: Secondary | ICD-10-CM | POA: Insufficient documentation

## 2020-02-17 DIAGNOSIS — Z20822 Contact with and (suspected) exposure to covid-19: Secondary | ICD-10-CM | POA: Diagnosis not present

## 2020-02-17 LAB — HYPERSENSITIVITY PNUEMONITIS PROFILE
ASPERGILLUS FUMIGATUS: NEGATIVE
Faenia retivirgula: NEGATIVE
Pigeon Serum: NEGATIVE
S. VIRIDIS: NEGATIVE
T. CANDIDUS: NEGATIVE
T. VULGARIS: NEGATIVE

## 2020-02-17 LAB — SJOGREN'S SYNDROME ANTIBODS(SSA + SSB)
SSA (Ro) (ENA) Antibody, IgG: <1 AI
SSB (La) (ENA) Antibody, IgG: <1 AI

## 2020-02-17 LAB — ANTI-NUCLEAR AB-TITER (ANA TITER): ANA Titer 1: 1:40 {titer} — ABNORMAL HIGH

## 2020-02-17 LAB — ANA: Anti Nuclear Antibody (ANA): POSITIVE — AB

## 2020-02-17 LAB — ALDOLASE: Aldolase: 7 U/L (ref <=8.1)

## 2020-02-17 LAB — ANTI-SCLERODERMA ANTIBODY: Scleroderma (Scl-70) (ENA) Antibody, IgG: <1 AI

## 2020-02-17 LAB — CK TOTAL AND CKMB (NOT AT ARMC)
CK, MB: 1.8 ng/mL (ref 0–5.0)
Relative Index: 2.1 (ref 0–4.0)
Total CK: 84 U/L (ref 44–196)

## 2020-02-17 LAB — RHEUMATOID FACTOR: Rheumatoid fact SerPl-aCnc: <14 IU/mL (ref <14)

## 2020-02-17 LAB — EXTRA SPECIMEN

## 2020-02-17 LAB — ANGIOTENSIN CONVERTING ENZYME: Angiotensin-Converting Enzyme: <5 U/L — ABNORMAL LOW (ref 9–67)

## 2020-02-17 LAB — SARS CORONAVIRUS 2 (TAT 6-24 HRS): SARS Coronavirus 2: NEGATIVE

## 2020-02-17 LAB — ANTI-DNA ANTIBODY, DOUBLE-STRANDED: ds DNA Ab: 1 IU/mL

## 2020-02-17 LAB — CYCLIC CITRUL PEPTIDE ANTIBODY, IGG: Cyclic Citrullin Peptide Ab: <16 UNITS

## 2020-02-20 ENCOUNTER — Ambulatory Visit (INDEPENDENT_AMBULATORY_CARE_PROVIDER_SITE_OTHER): Payer: Medicare HMO | Admitting: Internal Medicine

## 2020-02-20 ENCOUNTER — Other Ambulatory Visit: Payer: Self-pay

## 2020-02-20 DIAGNOSIS — J449 Chronic obstructive pulmonary disease, unspecified: Secondary | ICD-10-CM

## 2020-02-20 DIAGNOSIS — R0609 Other forms of dyspnea: Secondary | ICD-10-CM

## 2020-02-20 DIAGNOSIS — R0602 Shortness of breath: Secondary | ICD-10-CM

## 2020-02-20 LAB — PULMONARY FUNCTION TEST
DL/VA % pred: 112 %
DL/VA: 4.68 ml/min/mmHg/L
DLCO cor % pred: 77 %
DLCO cor: 18.16 ml/min/mmHg
DLCO unc % pred: 77 %
DLCO unc: 18.16 ml/min/mmHg
FEF 25-75 Post: 0.51 L/sec
FEF 25-75 Pre: 0.61 L/sec
FEF2575-%Change-Post: -15 %
FEF2575-%Pred-Post: 22 %
FEF2575-%Pred-Pre: 26 %
FEV1-%Change-Post: -5 %
FEV1-%Pred-Post: 43 %
FEV1-%Pred-Pre: 45 %
FEV1-Post: 1.24 L
FEV1-Pre: 1.32 L
FEV1FVC-%Change-Post: -2 %
FEV1FVC-%Pred-Pre: 77 %
FEV6-%Change-Post: -3 %
FEV6-%Pred-Post: 58 %
FEV6-%Pred-Pre: 60 %
FEV6-Post: 2.16 L
FEV6-Pre: 2.23 L
FEV6FVC-%Change-Post: 0 %
FEV6FVC-%Pred-Post: 102 %
FEV6FVC-%Pred-Pre: 103 %
FVC-%Change-Post: -3 %
FVC-%Pred-Post: 56 %
FVC-%Pred-Pre: 58 %
FVC-Post: 2.23 L
FVC-Pre: 2.3 L
Post FEV1/FVC ratio: 56 %
Post FEV6/FVC ratio: 97 %
Pre FEV1/FVC ratio: 57 %
Pre FEV6/FVC Ratio: 97 %
RV % pred: 139 %
RV: 3.09 L
TLC % pred: 83 %
TLC: 5.29 L

## 2020-02-20 NOTE — Progress Notes (Signed)
PFT done today. 

## 2020-02-23 ENCOUNTER — Ambulatory Visit (INDEPENDENT_AMBULATORY_CARE_PROVIDER_SITE_OTHER): Payer: Medicare HMO | Admitting: Pulmonary Disease

## 2020-02-23 ENCOUNTER — Other Ambulatory Visit: Payer: Self-pay

## 2020-02-23 ENCOUNTER — Encounter: Payer: Self-pay | Admitting: Pulmonary Disease

## 2020-02-23 DIAGNOSIS — Z9189 Other specified personal risk factors, not elsewhere classified: Secondary | ICD-10-CM | POA: Diagnosis not present

## 2020-02-23 DIAGNOSIS — J449 Chronic obstructive pulmonary disease, unspecified: Secondary | ICD-10-CM | POA: Diagnosis not present

## 2020-02-23 DIAGNOSIS — I714 Abdominal aortic aneurysm, without rupture, unspecified: Secondary | ICD-10-CM

## 2020-02-23 DIAGNOSIS — F17210 Nicotine dependence, cigarettes, uncomplicated: Secondary | ICD-10-CM | POA: Diagnosis not present

## 2020-02-23 DIAGNOSIS — R918 Other nonspecific abnormal finding of lung field: Secondary | ICD-10-CM | POA: Diagnosis not present

## 2020-02-23 DIAGNOSIS — F17200 Nicotine dependence, unspecified, uncomplicated: Secondary | ICD-10-CM

## 2020-02-23 HISTORY — DX: Other nonspecific abnormal finding of lung field: R91.8

## 2020-02-23 MED ORDER — NICOTINE POLACRILEX 4 MG MT LOZG: 4.0000 mg | LOZENGE | OROMUCOSAL | 3 refills | Status: DC | PRN

## 2020-02-23 MED ORDER — NICOTINE 21 MG/24HR TD PT24: 21.0000 mg | MEDICATED_PATCH | Freq: Every day | TRANSDERMAL | 1 refills | Status: DC

## 2020-02-23 NOTE — Assessment & Plan Note (Signed)
Reviewed pulmonary function testing with patient Stage III COPD Emphasized need to stop smoking  Plan: Emphasized need to stop smoking Nicotine replacement therapies Continue Spiriva HandiHaler at this time, will send in prescription for Anoro Ellipta to investigate cost If cost for Anoro Ellipta is tolerable for patient patient will start Anoro Ellipta and stop Spiriva HandiHaler Follow-up with our office in 2 to 3 months

## 2020-02-23 NOTE — Patient Instructions (Signed)
You were seen today by Lauraine Rinne, NP  for:   1. Chronic obstructive pulmonary disease, unspecified COPD type ([place])  Continue Spiriva HandiHaler  We will send in a prescription for:  Anoro Ellipta  >>> Take 1 puff daily in the morning right when you wake up >>>Rinse your mouth out after use >>>This is a daily maintenance inhaler, NOT a rescue inhaler >>>Contact our office if you are having difficulties affording or obtaining this medication >>>It is important for you to be able to take this daily and not miss any doses   If the Anoro Ellipta is affordable at the pharmacy please start taking an oral Ellipta and stop Spiriva HandiHaler  As emphasized today on televisit please stop smoking  We will see you back in 3 months  2. Abnormal findings on diagnostic imaging of lung  - CT Chest High Resolution; Future  We will repeat a high-resolution CT of your chest in 1 year  3. TOBACCO USE  - nicotine (NICODERM CQ) 21 mg/24hr patch; Place 1 patch (21 mg total) onto the skin daily.  Dispense: 28 patch; Refill: 1 - nicotine polacrilex (COMMIT) 4 MG lozenge; Take 1 lozenge (4 mg total) by mouth as needed for smoking cessation.  Dispense: 108 tablet; Refill: 3  We recommend that you stop smoking.  >>>You need to set a quit date >>>If you have friends or family who smoke, let them know you are trying to quit and not to smoke around you or in your living environment  Smoking Cessation Resources:  1 800 QUIT NOW  >>> Patient to call this resource and utilize it to help support her quit smoking >>> Keep up your hard work with stopping smoking  You can also contact the [facility] >>>For smoking cessation classes call [phone]  We do not recommend using e-cigarettes as a form of stopping smoking  You can sign up for smoking cessation support texts and information:  >>>https://smokefree.gov/smokefreetxt   Nicotine patches: >>>Make sure you rotate sites that you do  not get skin irritation, Apply 1 patch each morning to a non-hairy skin site  If you are smoking greater than 10 cigarettes/day and weigh over 45 kg start with the nicotine patch of 21 mg a day for 6 weeks, then 14 mg a day for 2 weeks, then finished with 7 mg a day for 2 weeks, then stop  If you are smoking less than 10 cigarettes a day or weight less than 45 kg start with medium dose pack of 14 mg a day for 6 weeks, followed by 7 mg a day for 2 weeks   >>>If insomnia occurs you are having trouble sleeping you can take the patch off at night, and place a new one on in the morning >>>If the patch is removed at night and you have morning cravings start short acting nicotine replacement therapy such as gum or lozenges  Nicotine lozenge: Lozenges are commonly uses short acting NRT product  >>>Smokers who smoke within 30 minutes of awakening should use 4 mg dose >>>Smokers who wait more than 30 minutes after awakening to smoke should use 2 mg dose  Can use up to 1 lozenge every 1-2 hours for 6 weeks >>>Total amount of lozenges that can be used per day as 20 >>>Gradually reduce number of lozenges used per day after 2 weeks of use  Place lozenge in mouth and allowed to dissolve for 30 minutes loss and does not need to be chewed  Lozenges have advantages to be able to be used in people with TMG, poor dentition, dentures   4. At risk for obstructive sleep apnea  Complete your home sleep study is ordered  5. AAA (abdominal aortic aneurysm) without rupture ([place])  Schedule follow-up with vascular MD   We recommend today:  Orders Placed This Encounter  Procedures  . CT Chest High Resolution    -High-res CT with supine and prone positioning -inspiratory and expiratory cuts.  -Only to be read by Dr. Rosario Jacks and Dr. Weber Cooks.    Standing Status:   Future    Standing Expiration Date:   02/22/2021    Scheduling Instructions:     June / 2022    Order Specific Question:   Preferred imaging  location?    Answer:   [place]    Order Specific Question:   Radiology Contrast Protocol - do NOT remove file path    Answer:   [email or web address]   Orders Placed This Encounter  Procedures  . CT Chest High Resolution   Meds ordered this encounter  Medications  . nicotine (NICODERM CQ) 21 mg/24hr patch    Sig: Place 1 patch (21 mg total) onto the skin daily.    Dispense:  28 patch    Refill:  1  . nicotine polacrilex (COMMIT) 4 MG lozenge    Sig: Take 1 lozenge (4 mg total) by mouth as needed for smoking cessation.    Dispense:  108 tablet    Refill:  3    Follow Up:    Return in about 3 months (around 05/25/2020), or if symptoms worsen or fail to improve, for Follow up with Dr. Purnell Shoemaker.   Please do your part to reduce the spread of COVID-19:      Reduce your risk of any infection  and COVID19 by using the similar precautions used for avoiding the common cold or flu:  Marland Kitchen Wash your hands often with soap and warm water for at least 20 seconds.  If soap and water are not readily available, use an alcohol-based hand sanitizer with at least 60% alcohol.  . If coughing or sneezing, cover your mouth and nose by coughing or sneezing into the elbow areas of your shirt or coat, into a tissue or into your sleeve (not your hands). Langley Gauss A MASK when in public  . Avoid shaking hands with others and consider head nods or verbal greetings only. . Avoid touching your eyes, nose, or mouth with unwashed hands.  . Avoid close contact with people who are sick. . Avoid places or events with large numbers of people in one location, like concerts or sporting events. . If you have some symptoms but not all symptoms, continue to monitor at home and seek medical attention if your symptoms worsen. . If you are having a medical emergency, call 911.   [place] / e-Visit:  [email or web address]         [facility]: [facility]: [phone]                   [facility]: [phone]     It is flu season:   >>> Best ways to protect herself from the flu: Receive the yearly flu vaccine, practice good hand hygiene washing with soap and also using hand sanitizer when available, eat a nutritious meals, get adequate rest, hydrate appropriately   Please  contact the office if your symptoms worsen or you have concerns that you are not improving.   Thank you for choosing [facility] for your healthcare, and for allowing [place] to partner with you on your healthcare journey. I am thankful to be able to provide care to you today.   Steven Quaker FNP-C

## 2020-02-23 NOTE — Assessment & Plan Note (Signed)
Pending home sleep study ordered by Dr. Jenetta Downer  Plan: Patient to complete home sleep study as ordered

## 2020-02-23 NOTE — Assessment & Plan Note (Signed)
Plan: Patient to schedule follow-up with vascular team last seen in 2018

## 2020-02-23 NOTE — Assessment & Plan Note (Signed)
>>  ASSESSMENT AND PLAN FOR AAA (ABDOMINAL AORTIC ANEURYSM) WITHOUT RUPTURE ([facility]) WRITTEN ON 02/23/2020  9:49 AM BY MACK, BRIAN P, NP  Plan: Patient to schedule follow-up with vascular team last seen in 2018

## 2020-02-23 NOTE — Progress Notes (Signed)
Virtual Visit via Telephone Note  I connected with Steven Hayden on 02/23/20 at  9:30 AM EDT by telephone and verified that I am speaking with the correct person using two identifiers.  Location: Patient: Home Provider: [facility] - [address], [place], [place], [place] [address]   I discussed the limitations, risks, security and privacy concerns of performing an evaluation and management service by telephone and the availability of in person appointments. I also discussed with the patient that there may be a patient responsible charge related to this service. The patient expressed understanding and agreed to proceed.  Patient consented to consult via telephone: Yes People present and their role in pt care: Pt     History of Present Illness:  69 year old male current everyday smoker followed in our office for COPD  Past medical history: Hypertension, schizophrenia, hyperlipidemia Smoking history: Current everyday smoker, 1 packs/day, 125-pack-year smoking history Maintenance: Spiriva Handihaler  Patient of Dr. Chase Caller for pulmonary, patient of Dr. Jenetta Downer for sleep  Chief complaint: review PFTs    69 year old male current everyday smoker initially referred to our office back in May/2021 for evaluation of dyspnea in the setting of morbid obesity, current smoker, suspected COPD.  Patient also with high pretest probability of obstructive sleep apnea and was referred to Dr. Jenetta Downer one of our sleep MDs in our office.  Patient completed follow-up with Dr. Jenetta Downer, Home sleep study was ordered.  This has not been scheduled yet.  Patient smoking 1 pack/day.  He is interested in stopping smoking.  He has tried multiple different therapies to stop smoking.  He is willing to restart nicotine replacement therapies.  He has recently had lab work completed by Dr. Chase Caller to investigate for connective tissue labs.  Only elevation was a positive ANA with a low titer.  Smoking assessment and  cessation counseling  Patient currently smoking: 1 ppd I have advised the patient to quit/stop smoking as soon as possible due to high risk for multiple medical problems.  It will also be very difficult for [place] to manage patient's  respiratory symptoms and status if we continue to expose her lungs to a known irritant.  We do not advise e-cigarettes as a form of stopping smoking.  Patient is not willing to quit smoking.  Tried hyponosis, Tried NRT, Tried cold [place], Tried acupuncture  I have advised the patient that we can assist and have options of nicotine replacement therapy, provided smoking cessation education today, provided smoking cessation counseling, and provided cessation resources.  Follow-up next office visit office visit for assessment of smoking cessation.    Smoking cessation counseling advised for: 6 min   Observations/Objective:  01/29/2020-CT chest high-res-mild centrilobular emphysema with diffuse bronchial wall thickening and saber-sheath trachea suggesting COPD, minimal patchy subpleural reticulation and groundglass opacity in lower lungs, showing mild interstitial lung disease such as NSIP or early UIP, will repeat CT chest in 12 months, ectatic 4.4 cm ascending thoracic aorta, recommend annual imaging, one-vessel aortic arthrosclerosis  02/20/2020-pulmonary function test-FVC 2.30 (58% predicted), postbronchodilator ratio 56, postbronchodilator FEV1 1.24 (43% predicted), DLCO 18.16 (77% predicted)  Social History   Tobacco Use  Smoking Status Current Every Day Smoker  . Packs/day: 2.50  . Years: 50.00  . Pack years: 125.00  . Types: Cigarettes  Smokeless Tobacco Never Used   Immunization History  Administered Date(s) Administered  . Influenza Whole 06/07/2010  . Influenza,inj,Quad PF,6+ Mos 06/22/2014  . Influenza-Unspecified 09/16/2019  . PFIZER SARS-COV-2 Vaccination 11/08/2019  Assessment and Plan:  TOBACCO USE Plan: Start nicotine replacement  therapies We will set up clinical pharmacy team appointment for smoking cessation Emphasized need to stop smoking  COPD (chronic obstructive pulmonary disease) Reviewed pulmonary function testing with patient Stage III COPD Emphasized need to stop smoking  Plan: Emphasized need to stop smoking Nicotine replacement therapies Continue Spiriva HandiHaler at this time, will send in prescription for Anoro Ellipta to investigate cost If cost for Anoro Ellipta is tolerable for patient patient will start Anoro Ellipta and stop Spiriva HandiHaler Follow-up with our office in 2 to 3 months  AAA (abdominal aortic aneurysm) without rupture Plan: Patient to schedule follow-up with vascular team last seen in 2018  At risk for obstructive sleep apnea Pending home sleep study ordered by Dr. 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 Purnell Shoemaker.   I discussed the assessment and treatment plan with the patient. The patient was provided an opportunity to ask questions and all were answered. The patient agreed with the plan and demonstrated an understanding of the instructions.   The patient was advised to call back or seek an in-person evaluation if the symptoms worsen or if the condition fails to improve as anticipated.  I provided 25 minutes of non-face-to-face time during this encounter.   Lauraine Rinne, NP

## 2020-02-23 NOTE — Assessment & Plan Note (Signed)
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

## 2020-02-23 NOTE — Assessment & Plan Note (Signed)
Plan: Start nicotine replacement therapies We will set up clinical pharmacy team appointment for smoking cessation Emphasized need to stop smoking

## 2020-02-24 ENCOUNTER — Ambulatory Visit: Payer: Medicare HMO | Admitting: Adult Health

## 2020-03-02 ENCOUNTER — Other Ambulatory Visit: Payer: Self-pay | Admitting: Pulmonary Disease

## 2020-03-02 MED ORDER — ANORO ELLIPTA 62.5-25 MCG/INH IN AEPB: 1.0000 | INHALATION_SPRAY | Freq: Every day | RESPIRATORY_TRACT | 2 refills | Status: DC

## 2020-03-12 ENCOUNTER — Telehealth: Payer: Self-pay

## 2020-03-12 NOTE — Telephone Encounter (Signed)
If patient is willing to transition to [place].  That would be my preference given the fact that he is used to Spiriva.Please confirm that he has been on a Respimat device before starting.If so and he is willing for this transition okay to prescribe:   Stiolto Respimat inhaler >>>2 puffs daily >>>Take this no matter what >>>This is not a rescue inhaler  3 refills  Please make sure he has follow-up with our office sometime within the next 3 months  [place]

## 2020-03-12 NOTE — Telephone Encounter (Signed)
Attempted to call pt but line went straight to VM. Left message for him to return call. 

## 2020-03-12 NOTE — Telephone Encounter (Signed)
Medication name and strength: Anoro Ellipta 62.5-25mcg/inh Aerosol Powder Provider: Ramaswamy  [facility]: [facility] [phone]  Patient insurance ID: [id number] Phone: [phone]   Was the PA started on CMM? Yes If yes, please enter the Key: [id number] Timeframe for approval/denial: Not given  Went to start PA and  [employer] covered alternatives were Respimat solution for inhalation and Bevespi Aerosphere HFA aerosol inhaler. Would you like to try either of these? Pt was on Spiriva before switching to Anoro.

## 2020-03-15 ENCOUNTER — Telehealth: Payer: Self-pay | Admitting: Pulmonary Disease

## 2020-03-15 NOTE — Telephone Encounter (Signed)
Pt was called left vm to state Anoro Ellipta was not covered . But provider wants pt to try Stiolto Respimat Inhaler.pt was left vm to return call back to office.

## 2020-03-15 NOTE — Telephone Encounter (Signed)
Left VM x1

## 2020-03-16 NOTE — Telephone Encounter (Signed)
LMTCB x2 for pt 

## 2020-03-16 NOTE — Telephone Encounter (Signed)
Attempted to call pt but unable to reach. Left message for him to return call. °

## 2020-03-16 NOTE — Telephone Encounter (Signed)
Pt returning a phone call. Pt can be reached at [phone].

## 2020-03-17 NOTE — Telephone Encounter (Signed)
LMTCB x3 for pt. We have attempted to contact pt several times with no success or call back from pt. Per triage protocol, message will be closed.   

## 2020-03-18 ENCOUNTER — Telehealth: Payer: Self-pay | Admitting: Pulmonary Disease

## 2020-03-18 NOTE — Telephone Encounter (Signed)
Called pt but line went straight to VM. Left message for him to return call.

## 2020-03-19 NOTE — Telephone Encounter (Signed)
Called and left vm for patient to return our call.  Call went straight to vm.

## 2020-03-22 NOTE — Telephone Encounter (Signed)
lmtcb x 3 and will close per protocol  

## 2020-03-29 ENCOUNTER — Encounter: Payer: Self-pay | Admitting: Pulmonary Disease

## 2020-04-15 ENCOUNTER — Ambulatory Visit (INDEPENDENT_AMBULATORY_CARE_PROVIDER_SITE_OTHER): Payer: Medicare HMO | Admitting: Family Medicine

## 2020-04-15 ENCOUNTER — Encounter: Payer: Self-pay | Admitting: Family Medicine

## 2020-04-15 ENCOUNTER — Other Ambulatory Visit: Payer: Self-pay

## 2020-04-15 VITALS — BP 120/70 | HR 85 | Resp 18 | Wt 270.8 lb

## 2020-04-15 DIAGNOSIS — F17210 Nicotine dependence, cigarettes, uncomplicated: Secondary | ICD-10-CM | POA: Insufficient documentation

## 2020-04-15 DIAGNOSIS — I10 Essential (primary) hypertension: Secondary | ICD-10-CM | POA: Diagnosis not present

## 2020-04-15 DIAGNOSIS — Z79899 Other long term (current) drug therapy: Secondary | ICD-10-CM

## 2020-04-15 DIAGNOSIS — N189 Chronic kidney disease, unspecified: Secondary | ICD-10-CM | POA: Diagnosis not present

## 2020-04-15 DIAGNOSIS — I714 Abdominal aortic aneurysm, without rupture, unspecified: Secondary | ICD-10-CM

## 2020-04-15 DIAGNOSIS — K429 Umbilical hernia without obstruction or gangrene: Secondary | ICD-10-CM | POA: Diagnosis not present

## 2020-04-15 DIAGNOSIS — F988 Other specified behavioral and emotional disorders with onset usually occurring in childhood and adolescence: Secondary | ICD-10-CM

## 2020-04-15 DIAGNOSIS — E669 Obesity, unspecified: Secondary | ICD-10-CM | POA: Diagnosis not present

## 2020-04-15 DIAGNOSIS — R7309 Other abnormal glucose: Secondary | ICD-10-CM | POA: Diagnosis not present

## 2020-04-15 DIAGNOSIS — J449 Chronic obstructive pulmonary disease, unspecified: Secondary | ICD-10-CM | POA: Diagnosis not present

## 2020-04-15 DIAGNOSIS — E785 Hyperlipidemia, unspecified: Secondary | ICD-10-CM | POA: Diagnosis not present

## 2020-04-15 HISTORY — DX: Obesity, unspecified: E66.9

## 2020-04-15 HISTORY — DX: Nicotine dependence, cigarettes, uncomplicated: F17.210

## 2020-04-15 LAB — LIPID PANEL
Chol/HDL Ratio: 6.2 ratio — ABNORMAL HIGH (ref 0.0–5.0)
Cholesterol, Total: 240 mg/dL — ABNORMAL HIGH (ref 100–199)
HDL: 39 mg/dL — ABNORMAL LOW (ref >39)
LDL Chol Calc (NIH): 159 mg/dL — ABNORMAL HIGH (ref 0–99)
Triglycerides: 226 mg/dL — ABNORMAL HIGH (ref 0–149)
VLDL Cholesterol Cal: 42 mg/dL — ABNORMAL HIGH (ref 5–40)

## 2020-04-15 LAB — COMPREHENSIVE METABOLIC PANEL
ALT: 38 IU/L (ref 0–44)
AST: 24 IU/L (ref 0–40)
Albumin/Globulin Ratio: 1.6 (ref 1.2–2.2)
Albumin: 4.3 g/dL (ref 3.8–4.8)
Alkaline Phosphatase: 70 IU/L (ref 48–121)
BUN/Creatinine Ratio: 17 (ref 10–24)
BUN: 19 mg/dL (ref 8–27)
Bilirubin Total: 0.2 mg/dL (ref 0.0–1.2)
CO2: 30 mmol/L — ABNORMAL HIGH (ref 20–29)
Calcium: 9.3 mg/dL (ref 8.6–10.2)
Chloride: 99 mmol/L (ref 96–106)
Creatinine, Ser: 1.1 mg/dL (ref 0.76–1.27)
GFR calc Af Amer: 79 mL/min/{1.73_m2} (ref >59)
GFR calc non Af Amer: 69 mL/min/{1.73_m2} (ref >59)
Globulin, Total: 2.7 g/dL (ref 1.5–4.5)
Glucose: 127 mg/dL — ABNORMAL HIGH (ref 65–99)
Potassium: 4.2 mmol/L (ref 3.5–5.2)
Sodium: 142 mmol/L (ref 134–144)
Total Protein: 7 g/dL (ref 6.0–8.5)

## 2020-04-15 LAB — CBC WITH DIFFERENTIAL/PLATELET
Basophils Absolute: 0 10*3/uL (ref 0.0–0.2)
Basos: 1 %
EOS (ABSOLUTE): 0.1 10*3/uL (ref 0.0–0.4)
Eos: 1 %
Hematocrit: 51 % (ref 37.5–51.0)
Hemoglobin: 16.6 g/dL (ref 13.0–17.7)
Immature Grans (Abs): 0 10*3/uL (ref 0.0–0.1)
Immature Granulocytes: 0 %
Lymphocytes Absolute: 3.1 10*3/uL (ref 0.7–3.1)
Lymphs: 40 %
MCH: 31.1 pg (ref 26.6–33.0)
MCHC: 32.5 g/dL (ref 31.5–35.7)
MCV: 96 fL (ref 79–97)
Monocytes Absolute: 0.7 10*3/uL (ref 0.1–0.9)
Monocytes: 10 %
Neutrophils Absolute: 3.7 10*3/uL (ref 1.4–7.0)
Neutrophils: 48 %
Platelets: 189 10*3/uL (ref 150–450)
RBC: 5.33 x10E6/uL (ref 4.14–5.80)
RDW: 13.7 % (ref 11.6–15.4)
WBC: 7.7 10*3/uL (ref 3.4–10.8)

## 2020-04-15 MED ORDER — AMPHETAMINE-DEXTROAMPHETAMINE 30 MG PO TABS: 30.0000 mg | ORAL_TABLET | Freq: Every day | ORAL | 0 refills | Status: DC

## 2020-04-15 NOTE — Progress Notes (Signed)
Subjective:    Patient ID: Steven Hayden, male    DOB: 24-Oct-1950, 69 y.o.   MRN: [id number]  HPI Chief Complaint  Patient presents with  . follow-up    follow-up on adderall   He is here for medication management visit. Requests that I fill out a handicap placard form for him. He has one in the past for his COPD and limited mobility.   ADD-reports taking Adderall daily and doing well.  States he is unable to function without it. States he takes his Adderall early in the morning and it lasts all day. States is he does not take it before noon then he does not take it because it keeps him awake.  Appetite is fine. Sleeps well. States his previous PCP, Dr. Leanne Chang, gave him 3 month refills at a time and he would like for me to do this.   States without Adderall he is irritable, sluggish and can't function. On the medication he is able to perform his ADLs. He [place]   Hypertension-taking medications without any concerns. Does not check BP at home.   AAA- he has not yet followed up with Dr. Bridgett Larsson but agrees to call and schedule. No abdominal or back pain.   Umbilical hernia- states he has had this for years but it seems to be getting worse. Is not ready to do anything about it.   Smoking - he has cut back significantly. Only smokes one cigarette per day now. This is usually when he is waking up. Motivated to stop completely.   COPD- states his breathing is much better. Managed by pulmonologist. Has not needed albuterol in several weeks.   Obesity- states he is aware that he needs to lose weight.   CKD- review of his EMR shows elevated serum creatinine which needs to be evaluated.   Denies fever, chills, dizziness, chest pain, palpitations, shortness of breath, abdominal pain, N/V/D, urinary symptoms, LE edema.    Reviewed allergies, medications, past medical, surgical, family, and social history.    Review of Systems Pertinent  positives and negatives in the history of present illness.     Objective:   Physical Exam BP 120/70   Pulse 85   Wt 270 lb 12.8 oz (122.8 kg)   SpO2 92%   BMI 37.77 kg/m   Alert and in no distress.  Cardiac exam shows a regular sinus rhythm without murmurs or gallops. Lungs are clear to auscultation. Abdomen is obese, non tender, large umbilical hernia without any sign of incarceration or gangrene, no guarding or rebound, Extremities without edema. Skin is warm and dry.  Normal speech, mood and thought process.       Assessment & Plan:  Attention deficit disorder, unspecified hyperactivity presence - Plan: Urine Drug Panel 7 -Reports taking Adderall daily and doing well on this medication.  He does report that he is not able to function without the medication.  Does not appear to be misusing or abusing medication.  PDMP reviewed.  No obvious side effects.  I will refill the medication accordingly.  Essential hypertension - Plan: CBC with Differential/Platelet, Comprehensive metabolic panel -His blood pressure seems to be well controlled.  Continue on current medications.  Encouraged him to check his blood pressure at home.  Eat a low-sodium diet.  Hyperlipidemia, unspecified hyperlipidemia type - Plan: Lipid panel -States he is not able or willing to take statins.  Chronic obstructive pulmonary disease, unspecified  COPD type [facility]) -Pulmonology is managing his COPD.  He appears to be doing much better and has not needed his rescue inhaler recently.  Cigarette smoker motivated to quit -Congratulated him on cutting significantly back on smoking.  I will provide him with tips to help him with completely stopping smoking and encouraged him to call 1 800 quit now to help him as well.  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 Starlyn Skeans office and schedule a visit.  Obesity (BMI 30-39.9) - Plan: Lipid panel -Counseling on healthy diet and exercise for  weight loss.  Discussed how weight loss can help his overall health.  Umbilical hernia without obstruction and without gangrene  Chronic kidney disease, unspecified CKD stage - Plan: CBC with Differential/Platelet, Comprehensive metabolic panel  Medication management - Plan: Urine Drug Panel 7 -I will refill his Adderall for 3 months.  PDMP reviewed and no aberrancies were found.  Last refill was on 03/21/2020.  I will also screen cream for the medication with UDS.  He reports taking it daily.  Handicap placard was filled out per his request.

## 2020-04-15 NOTE — Patient Instructions (Signed)
Call and schedule with Dr. Bridgett Larsson, your vascular doctor about your aortic aneurysm.   Try to eat a healthy, low fat diet.   If you are ready to see a surgeon about your hernia, let me know and I can refer you.   Congratulations of cutting back on smoking.   Call a phone quitline (1-800-QUIT-NOW)    Steps to Quit Smoking Smoking tobacco is the leading cause of preventable death. It can affect almost every organ in the body. Smoking puts you and people around you at risk for many serious, long-lasting (chronic) diseases. Quitting smoking can be hard, but it is one of the best things that you can do for your health. It is never too late to quit. How do I get ready to quit? When you decide to quit smoking, make a plan to help you succeed. Before you quit:  Pick a date to quit. Set a date within the next 2 weeks to give you time to prepare.  Write down the reasons why you are quitting. Keep this list in places where you will see it often.  Tell your family, friends, and co-workers that you are quitting. Their support is important.  Talk with your doctor about the choices that may help you quit.  Find out if your health insurance will pay for these treatments.  Know the people, places, things, and activities that make you want to smoke (triggers). Avoid them. What first steps can I take to quit smoking?  Throw away all cigarettes at home, at work, and in your car.  Throw away the things that you use when you smoke, such as ashtrays and lighters.  Clean your car. Make sure to empty the ashtray.  Clean your home, including curtains and carpets. What can I do to help me quit smoking? Talk with your doctor about taking medicines and seeing a counselor at the same time. You are more likely to succeed when you do both.  If you are pregnant or breastfeeding, talk with your doctor about counseling or other ways to quit smoking. Do not take medicine to help you quit smoking unless your doctor  tells you to do so. To quit smoking: Quit right away  Quit smoking totally, instead of slowly cutting back on how much you smoke over a period of time.  Go to counseling. You are more likely to quit if you go to counseling sessions regularly. Take medicine You may take medicines to help you quit. Some medicines need a prescription, and some you can buy over-the-counter. Some medicines may contain a drug called nicotine to replace the nicotine in cigarettes. Medicines may:  Help you to stop having the desire to smoke (cravings).  Help to stop the problems that come when you stop smoking (withdrawal symptoms). Your doctor may ask you to use:  Nicotine patches, gum, or lozenges.  Nicotine inhalers or sprays.  Non-nicotine medicine that is taken by mouth. Find resources Find resources and other ways to help you quit smoking and remain smoke-free after you quit. These resources are most helpful when you use them often. They include:  Online chats with a Social worker.  Phone quitlines.  Printed Furniture conservator/restorer.  Support groups or group counseling.  Text messaging programs.  Mobile phone apps. Use apps on your mobile phone or tablet that can help you stick to your quit plan. There are many free apps for mobile phones and tablets as well as websites. Examples include Quit Guide from the [employer] and smokefree.gov  What things can I do to make it easier to quit?   Talk to your family and friends. Ask them to support and encourage you.  Call a phone quitline (1-800-QUIT-NOW), reach out to support groups, or work with a Social worker.  Ask people who smoke to not smoke around you.  Avoid places that make you want to smoke, such as: ? Bars. ? Parties. ? Smoke-break areas at work.  Spend time with people who do not smoke.  Lower the stress in your life. Stress can make you want to smoke. Try these things to help your stress: ? Getting regular exercise. ? Doing deep-breathing  exercises. ? Doing yoga. ? Meditating. ? Doing a body scan. To do this, close your eyes, focus on one area of your body at a time from head to toe. Notice which parts of your body are tense. Try to relax the muscles in those areas. How will I feel when I quit smoking? Day 1 to 3 weeks Within the first 24 hours, you may start to have some problems that come from quitting tobacco. These problems are very bad 2-3 days after you quit, but they do not often last for more than 2-3 weeks. You may get these symptoms:  Mood swings.  Feeling restless, nervous, angry, or annoyed.  Trouble concentrating.  Dizziness.  Strong desire for high-sugar foods and nicotine.  Weight gain.  Trouble pooping (constipation).  Feeling like you may vomit (nausea).  Coughing or a sore throat.  Changes in how the medicines that you take for other issues work in your body.  Depression.  Trouble sleeping (insomnia). Week 3 and afterward After the first 2-3 weeks of quitting, you may start to notice more positive results, such as:  Better sense of smell and taste.  Less coughing and sore throat.  Slower heart rate.  Lower blood pressure.  Clearer skin.  Better breathing.  Fewer sick days. Quitting smoking can be hard. Do not give up if you fail the first time. Some people need to try a few times before they succeed. Do your best to stick to your quit plan, and talk with your doctor if you have any questions or concerns. Summary  Smoking tobacco is the leading cause of preventable death. Quitting smoking can be hard, but it is one of the best things that you can do for your health.  When you decide to quit smoking, make a plan to help you succeed.  Quit smoking right away, not slowly over a period of time.  When you start quitting, seek help from your doctor, family, or friends. This information is not intended to replace advice given to you by your health care provider. Make sure you discuss  any questions you have with your health care provider. Document Revised: 05/16/2019 Document Reviewed: 11/09/2018 Elsevier Patient Education  [place].

## 2020-04-16 ENCOUNTER — Encounter: Payer: Self-pay | Admitting: Family Medicine

## 2020-04-16 DIAGNOSIS — Z5320 Procedure and treatment not carried out because of patient's decision for unspecified reasons: Secondary | ICD-10-CM

## 2020-04-16 HISTORY — DX: Procedure and treatment not carried out because of patient's decision for unspecified reasons: Z53.20

## 2020-04-16 NOTE — Progress Notes (Signed)
Please ask Verdis Frederickson to add a hgb A1c  due to elevated serum glucose.

## 2020-04-18 ENCOUNTER — Encounter: Payer: Self-pay | Admitting: Family Medicine

## 2020-04-18 DIAGNOSIS — R7303 Prediabetes: Secondary | ICD-10-CM | POA: Insufficient documentation

## 2020-04-18 LAB — URINE DRUG PANEL 7
Amphetamines, Urine: POSITIVE — AB
Barbiturate Quant, Ur: NEGATIVE ng/mL
Benzodiazepine Quant, Ur: NEGATIVE ng/mL
Cannabinoid Quant, Ur: NEGATIVE ng/mL
Cocaine (Metab.): NEGATIVE ng/mL
Opiate Quant, Ur: NEGATIVE ng/mL
PCP Quant, Ur: NEGATIVE ng/mL

## 2020-04-19 ENCOUNTER — Other Ambulatory Visit: Payer: Self-pay

## 2020-04-19 ENCOUNTER — Telehealth: Payer: Self-pay | Admitting: Internal Medicine

## 2020-04-19 DIAGNOSIS — I714 Abdominal aortic aneurysm, without rupture, unspecified: Secondary | ICD-10-CM

## 2020-04-19 NOTE — Telephone Encounter (Signed)
Seen by Wyn Quaker in June 2021 - he recommended short term clinical followup with me in 3 months but ok to come for 15 min slot face to face in OCt/Nov. No need for breathing test but will eval for quit smoking and simple walk test and symptom score at that visit

## 2020-04-21 NOTE — Telephone Encounter (Signed)
Recall has been placed in regards to getting pt scheduled for an appt with MR in October 2021. Nothing further needed.

## 2020-04-22 ENCOUNTER — Ambulatory Visit (HOSPITAL_COMMUNITY)
Admission: RE | Admit: 2020-04-22 | Discharge: 2020-04-22 | Disposition: A | Discharge status: Home / Self Care | Payer: Medicare HMO | Source: Ambulatory Visit | Attending: Vascular Surgery | Admitting: Vascular Surgery

## 2020-04-22 ENCOUNTER — Other Ambulatory Visit: Payer: Self-pay

## 2020-04-22 ENCOUNTER — Ambulatory Visit (INDEPENDENT_AMBULATORY_CARE_PROVIDER_SITE_OTHER): Payer: Medicare HMO | Admitting: Physician Assistant

## 2020-04-22 VITALS — BP 114/79 | HR 66 | Temp 98.5°F | Resp 20 | Ht 71.0 in | Wt 265.5 lb

## 2020-04-22 DIAGNOSIS — I714 Abdominal aortic aneurysm, without rupture, unspecified: Secondary | ICD-10-CM

## 2020-04-22 NOTE — Progress Notes (Signed)
Office Note     CC:  follow up Requesting Provider:  Girtha Rm, NP-C  HPI: Steven Hayden is a 69 y.o. ([birth year]) male who presents for surveillance follow up s/p EVAR in September of 2015 by Dr. Bridgett Larsson for a 10 cm AAA. His aneurysm duplex evaluations thus far have shown stable sac size and no endoleaks. He denies any abdominal or back pain. He does not have any claudication, rest pain or non healing wounds. He states that he is on a " regimen to quit smoking". Says that he use to smoke 1 ppd and now he has smoked 4 packs in 2 months so he is really working on stopping.   Patients PMhx, Family hx, Surgical Hx, and Social hx reviewed and have not changed since his last visit on 01/12/17  The pt not on a statin for cholesterol management.  The pt not on a daily aspirin.   Other AC: none The pt is on ACE + HCTZ for hypertension.   The pt is not diabetic.  Tobacco hx: current smoker  Past Medical History:  Diagnosis Date  . AAA (abdominal aortic aneurysm) ([place])   . ADD (attention deficit disorder)   . Atrial fibrillation ([place])   . Bipolar affective disorder ([place])   . COPD (chronic obstructive pulmonary disease) ([place])   . Hyperlipidemia   . Hypertension   . Prediabetes    Hgb A1c 6.4% August 2021  . Schizophrenia ([place])   . Statin declined 04/16/2020   States he will not take a statin. Has taken them in past.   . Tobacco abuse     Past Surgical History:  Procedure Laterality Date  . ABDOMINAL AORTIC ANEURYSM REPAIR    . ABDOMINAL AORTIC ENDOVASCULAR STENT GRAFT N/A 05/18/2014   Procedure: ABDOMINAL AORTIC ENDOVASCULAR STENT GRAFT;  Surgeon: Conrad Talkeetna, MD;  Location: [facility] OR;  Service: Vascular;  Laterality: N/A;  . KNEE SURGERY     bilateral    Social History   Socioeconomic History  . Marital status: Divorced    Spouse name: Not on file  . Number of children: 1  . Years of education: Not on file  . Highest education level: Not on file  Occupational History  . Not  on file  Tobacco Use  . Smoking status: Current Some Day Smoker    Packs/day: 0.01    Years: 50.00    Pack years: 0.50    Types: Cigarettes  . Smokeless tobacco: Never Used  Substance and Sexual Activity  . Alcohol use: Yes    Alcohol/week: 20.0 standard drinks    Types: 20 Shots of liquor per week  . Drug use: No  . Sexual activity: Not on file  Other Topics Concern  . Not on file  Social History Narrative  . Not on file   Social Determinants of Health   Financial Resource Strain:   . Difficulty of Paying Living Expenses: Not on file  Food Insecurity:   . Worried About Charity fundraiser in the Last Year: Not on file  . Ran Out of Food in the Last Year: Not on file  Transportation Needs:   . Lack of Transportation (Medical): Not on file  . Lack of Transportation (Non-Medical): Not on file  Physical Activity:   . Days of Exercise per Week: Not on file  . Minutes of Exercise per Session: Not on file  Stress:   . Feeling of Stress : Not on file  Social Connections:   .  Frequency of Communication with Friends and Family: Not on file  . Frequency of Social Gatherings with Friends and Family: Not on file  . Attends Religious Services: Not on file  . Active Member of Clubs or Organizations: Not on file  . Attends Archivist Meetings: Not on file  . Marital Status: Not on file  Intimate Partner Violence:   . Fear of Current or Ex-Partner: Not on file  . Emotionally Abused: Not on file  . Physically Abused: Not on file  . Sexually Abused: Not on file    Family History  Problem Relation Age of Onset  . Hypertension Brother   . Hyperlipidemia Brother   . Hypertension Father   . Hyperlipidemia Father   . Cancer Father     Current Outpatient Medications  Medication Sig Dispense Refill  . albuterol (VENTOLIN HFA) 108 (90 Base) MCG/ACT inhaler Inhale 2 puffs into the lungs every 6 (six) hours as needed for wheezing or shortness of breath.    Derrill Memo ON  05/22/2020] amphetamine-dextroamphetamine (ADDERALL) 30 MG tablet Take 1 tablet by mouth daily. 30 tablet 0  . amphetamine-dextroamphetamine (ADDERALL) 30 MG tablet Take 1 tablet by mouth daily. 30 tablet 0  . [START ON 06/21/2020] amphetamine-dextroamphetamine (ADDERALL) 30 MG tablet Take 1 tablet by mouth daily. 30 tablet 0  . ipratropium-albuterol (DUONEB) 0.5-2.5 (3) MG/3ML SOLN     . lisinopril-hydrochlorothiazide (PRINZIDE,ZESTORETIC) 20-25 MG per tablet TAKE TWO TABLETS BY MOUTH DAILY 60 tablet 2  . nicotine polacrilex (COMMIT) 4 MG lozenge Take 1 lozenge (4 mg total) by mouth as needed for smoking cessation. 108 tablet 3  . tiotropium (SPIRIVA HANDIHALER) 18 MCG inhalation capsule Place 1 capsule (18 mcg total) into inhaler and inhale daily. 90 capsule 3  . triamcinolone ointment (KENALOG) 0.1 % SMARTSIG:1 Sparingly Topical Twice Daily     No current facility-administered medications for this visit.    Allergies  Allergen Reactions  . Codeine Phosphate     REACTION: swelling of uvula, hives  . Dilaudid [Hydromorphone]      REVIEW OF SYSTEMS:  [X]  denotes positive finding, [ ]  denotes negative finding Cardiac  Comments:  Chest pain or chest pressure:    Shortness of breath upon exertion:    Short of breath when lying flat:    Irregular heart rhythm:        Vascular    Pain in calf, thigh, or hip brought on by ambulation:    Pain in feet at night that wakes you up from your sleep:     Blood clot in your veins:    Leg swelling:         Pulmonary    Oxygen at home:    Productive cough:     Wheezing:  x       Neurologic    Sudden weakness in arms or legs:     Sudden numbness in arms or legs:     Sudden onset of difficulty speaking or slurred speech:    Temporary loss of vision in one eye:     Problems with dizziness:         Gastrointestinal    Blood in stool:     Vomited blood:         Genitourinary    Burning when urinating:     Blood in urine:          Psychiatric    Major depression:         Hematologic  Bleeding problems:    Problems with blood clotting too easily:        Skin    Rashes or ulcers: X No rash but states he has been having itching on his bilateral flanks and underarms and its keeping him awake at night      Constitutional    Fever or chills:      PHYSICAL EXAMINATION:  Vitals:   04/22/20 0823  BP: 114/79  Pulse: 66  Resp: 20  Temp: 98.5 F (36.9 C)  TempSrc: Temporal  SpO2: 93%  Weight: 265 lb 8 oz (120.4 kg)  Height: 5\' 11"  (1.803 m)    General:  WDWN in NAD; vital signs documented above Gait: Normal HENT: WNL, normocephalic Pulmonary: normal non-labored breathing with wheezing Cardiac: regular HR, without  Murmurs without carotid bruit Abdomen: distended, soft, NT, no masses; large asymptomatic reducible umbilical hernia Vascular Exam/Pulses:  Right Left  Radial 2+ (normal) 2+ (normal)  Femoral 2+ (normal) 2+ (normal)  Popliteal Not palpable Not palpable  DP 2+ (normal) 2+ (normal)  PT 2+ (normal) 2+ (normal)   Extremities: without ischemic changes, without Gangrene , without cellulitis; without open wounds;  Musculoskeletal: no muscle wasting or atrophy  Neurologic: A&O X 3;  No focal weakness or paresthesias are detected Psychiatric:  The pt has Normal affect.   Non-Invasive Vascular Imaging:   EVAR Duplex (Date: 01/12/17)  AAA sac size: 6.63 cm; Right CIA: 2.01 cm; Left CIA: 1.81 cm. Technically difficult exam due to body habitus, no endoleak detected  Previous duplex: 01-12-17: 6.47 cm   ASSESSMENT/PLAN:: 69 y.o. male here for follow up for surveillance follow up of EVAR. He is s/p EVAR 05/18/14 by Dr. Bridgett Larsson. His repair has been stable without any signs of endoleak with stable sack size of 6.63. He remains without symptoms. Encourage smoking cessation.  - He will follow up in 1 year with EVAR duplex   Karoline Caldwell, PA-C [facility] [phone]  Clinic MD:    Dr. Oneida Alar

## 2020-04-24 LAB — HGB A1C W/O EAG: Hgb A1c MFr Bld: 6.4 % — ABNORMAL HIGH (ref 4.8–5.6)

## 2020-04-24 LAB — SPECIMEN STATUS REPORT

## 2020-06-15 ENCOUNTER — Encounter: Payer: Self-pay | Admitting: Family Medicine

## 2020-06-15 DIAGNOSIS — K429 Umbilical hernia without obstruction or gangrene: Secondary | ICD-10-CM

## 2020-06-22 ENCOUNTER — Other Ambulatory Visit: Payer: Self-pay

## 2020-06-22 ENCOUNTER — Ambulatory Visit (INDEPENDENT_AMBULATORY_CARE_PROVIDER_SITE_OTHER): Payer: Medicare HMO | Admitting: Surgery

## 2020-06-22 ENCOUNTER — Encounter: Payer: Self-pay | Admitting: Surgery

## 2020-06-22 VITALS — BP 98/69 | HR 74 | Temp 97.9°F | Resp 12 | Ht 71.0 in | Wt 255.0 lb

## 2020-06-22 DIAGNOSIS — K429 Umbilical hernia without obstruction or gangrene: Secondary | ICD-10-CM

## 2020-06-22 DIAGNOSIS — J449 Chronic obstructive pulmonary disease, unspecified: Secondary | ICD-10-CM | POA: Diagnosis not present

## 2020-06-22 DIAGNOSIS — F17200 Nicotine dependence, unspecified, uncomplicated: Secondary | ICD-10-CM | POA: Diagnosis not present

## 2020-06-22 DIAGNOSIS — E669 Obesity, unspecified: Secondary | ICD-10-CM | POA: Diagnosis not present

## 2020-06-22 NOTE — Patient Instructions (Addendum)
Please stop smoking. Continue to try and maintain a healthy weight. Please give [place] a call when you have quit smoking.   Umbilical Hernia, Adult  A hernia is a bulge of tissue that pushes through an opening between muscles. An umbilical hernia happens in the abdomen, near the belly button (umbilicus). The hernia may contain tissues from the small intestine, large intestine, or fatty tissue covering the intestines (omentum). Umbilical hernias in adults tend to get worse over time, and they require surgical treatment. There are several types of umbilical hernias. You may have:  A hernia located just above or below the umbilicus (indirect hernia). This is the most common type of umbilical hernia in adults.  A hernia that forms through an opening formed by the umbilicus (direct hernia).  A hernia that comes and goes (reducible hernia). A reducible hernia may be visible only when you strain, lift something heavy, or cough. This type of hernia can be pushed back into the abdomen (reduced).  A hernia that traps abdominal tissue inside the hernia (incarcerated hernia). This type of hernia cannot be reduced.  A hernia that cuts off blood flow to the tissues inside the hernia (strangulated hernia). The tissues can start to die if this happens. This type of hernia requires emergency treatment. What are the causes? An umbilical hernia happens when tissue inside the abdomen presses on a weak area of the abdominal muscles. What increases the risk? You may have a greater risk of this condition if you:  Are obese.  Have had several pregnancies.  Have a buildup of fluid inside your abdomen (ascites).  Have had surgery that weakens the abdominal muscles. What are the signs or symptoms? The main symptom of this condition is a painless bulge at or near the belly button. A reducible hernia may be visible only when you strain, lift something heavy, or cough. Other symptoms may include:  Dull pain.  A  feeling of pressure. Symptoms of a strangulated hernia may include:  Pain that gets increasingly worse.  Nausea and vomiting.  Pain when pressing on the hernia.  Skin over the hernia becoming red or purple.  Constipation.  Blood in the stool. How is this diagnosed? This condition may be diagnosed based on:  A physical exam. You may be asked to cough or strain while standing. These actions increase the pressure inside your abdomen and force the hernia through the opening in your muscles. Your health care provider may try to reduce the hernia by pressing on it.  Your symptoms and medical history. How is this treated? Surgery is the only treatment for an umbilical hernia. Surgery for a strangulated hernia is done as soon as possible. If you have a small hernia that is not incarcerated, you may need to lose weight before having surgery. Follow these instructions at home:  Lose weight, if told by your health care provider.  Do not try to push the hernia back in.  Watch your hernia for any changes in color or size. Tell your health care provider if any changes occur.  You may need to avoid activities that increase pressure on your hernia.  Do not lift anything that is heavier than 10 lb (4.5 kg) until your health care provider says that this is safe.  Take over-the-counter and prescription medicines only as told by your health care provider.  Keep all follow-up visits as told by your health care provider. This is important. Contact a health care provider if:  Your hernia gets  larger.  Your hernia becomes painful. Get help right away if:  You develop sudden, severe pain near the area of your hernia.  You have pain as well as nausea or vomiting.  You have pain and the skin over your hernia changes color.  You develop a fever. This information is not intended to replace advice given to you by your health care provider. Make sure you discuss any questions you have with your  health care provider. Document Revised: 10/03/2017 Document Reviewed: 02/19/2017 Elsevier Patient Education  [place].

## 2020-06-22 NOTE — Progress Notes (Signed)
Patient ID: Steven Hayden, male   DOB: September 02, 1951, 69 y.o.   MRN: [id number]  Chief Complaint: Umbilical hernia  History of Present Illness Steven Hayden is a 69 y.o. male with longstanding presence of an umbilical defect, more notable over the last 3 years.  Reports he is presenting today because his mom told him to.  He reports the bulge recurs when he has a bowel movement and then has to press it back and after he is completed.  Otherwise it does not bother him much at all.  Longtime smoker, admits he is trying to quit smoking.  No other issues with voiding, nausea vomiting, or fevers or chills.  Thinks he currently may have shingles due to underarm itching.  Past Medical History Past Medical History:  Diagnosis Date  . AAA (abdominal aortic aneurysm) ([place])   . ADD (attention deficit disorder)   . Atrial fibrillation ([place])   . Bipolar affective disorder ([place])   . COPD (chronic obstructive pulmonary disease) ([place])   . Hyperlipidemia   . Hypertension   . Prediabetes    Hgb A1c 6.4% August 2021  . Schizophrenia ([place])   . Statin declined 04/16/2020   States he will not take a statin. Has taken them in past.   . Tobacco abuse       Past Surgical History:  Procedure Laterality Date  . ABDOMINAL AORTIC ANEURYSM REPAIR    . ABDOMINAL AORTIC ENDOVASCULAR STENT GRAFT N/A 05/18/2014   Procedure: ABDOMINAL AORTIC ENDOVASCULAR STENT GRAFT;  Surgeon: Conrad [place], MD;  Location: [facility] OR;  Service: Vascular;  Laterality: N/A;  . KNEE SURGERY     bilateral    Allergies  Allergen Reactions  . Codeine Phosphate     REACTION: swelling of uvula, hives  . Dilaudid [Hydromorphone]     Current Outpatient Medications  Medication Sig Dispense Refill  . albuterol (VENTOLIN HFA) 108 (90 Base) MCG/ACT inhaler Inhale 2 puffs into the lungs every 6 (six) hours as needed for wheezing or shortness of breath.    . amphetamine-dextroamphetamine (ADDERALL) 30 MG tablet Take 1 tablet by mouth daily. 30  tablet 0  . ipratropium-albuterol (DUONEB) 0.5-2.5 (3) MG/3ML SOLN     . lisinopril-hydrochlorothiazide (PRINZIDE,ZESTORETIC) 20-25 MG per tablet TAKE TWO TABLETS BY MOUTH DAILY 60 tablet 2  . nicotine polacrilex (COMMIT) 4 MG lozenge Take 1 lozenge (4 mg total) by mouth as needed for smoking cessation. 108 tablet 3  . tiotropium (SPIRIVA HANDIHALER) 18 MCG inhalation capsule Place 1 capsule (18 mcg total) into inhaler and inhale daily. 90 capsule 3  . triamcinolone ointment (KENALOG) 0.1 % SMARTSIG:1 Sparingly Topical Twice Daily     No current facility-administered medications for this visit.    Family History Family History  Problem Relation Age of Onset  . Hypertension Brother   . Hyperlipidemia Brother   . Hypertension Father   . Hyperlipidemia Father   . Cancer Father       Social History Social History   Tobacco Use  . Smoking status: Current Some Day Smoker    Packs/day: 0.50    Years: 50.00    Pack years: 25.00    Types: Cigarettes  . Smokeless tobacco: Never Used  Substance Use Topics  . Alcohol use: Yes    Alcohol/week: 20.0 standard drinks    Types: 20 Shots of liquor per week    Comment: 3 shots every other day  . Drug use: No        Review  of Systems  Constitutional: Negative.   HENT: Positive for hearing loss.   Eyes: Negative.   Respiratory: Negative.   Cardiovascular: Negative.   Gastrointestinal: Negative.   Genitourinary: Negative.   Skin: Positive for itching.  Neurological: Negative.   Psychiatric/Behavioral: Negative.       Physical Exam Blood pressure 98/69, pulse 74, temperature 97.9 F (36.6 C), temperature source Oral, resp. rate 12, height 5\' 11"  (1.803 m), weight 255 lb (115.7 kg), SpO2 (!) 89 %. Last Weight  Most recent update: 06/22/2020  1:11 PM   Weight  115.7 kg (255 lb)            CONSTITUTIONAL: Well developed, and nourished, appropriately responsive and aware without distress.  Smells remarkably of tobacco  smoke. EYES: Sclera non-icteric.   EARS, NOSE, MOUTH AND THROAT: Mask worn.   Hearing is intact to voice.  NECK: Trachea is midline, and there is no jugular venous distension.  LYMPH NODES:  Lymph nodes in the neck are not enlarged. RESPIRATORY:  Lungs are clear, and breath sounds are equal bilaterally. Normal respiratory effort without pathologic use of accessory muscles. CARDIOVASCULAR: Heart is regular in rate and rhythm. GI: The abdomen is soft, nontender, and nondistended.  There may be 2 small fascial defects adjacent to 1 another, 1 of true umbilical hernia 1 separated by a small band of linea alba.  Both are readily reducible, nontender on reduction.  There is an obvious diastases recti associated with these as well.  There were no palpable masses. I did not appreciate hepatosplenomegaly. There were normal bowel sounds.  MUSCULOSKELETAL:  Symmetrical muscle tone appreciated in all four extremities.    SKIN: Skin turgor is normal. No pathologic skin lesions appreciated.  NEUROLOGIC:  Motor and sensation appear grossly normal.  Cranial nerves are grossly without defect. PSYCH:  Alert and oriented to person, place and time. Affect is appropriate for situation.  Data Reviewed I have personally reviewed what is currently available of the patient's imaging, recent labs and medical records.   Labs:  CBC Latest Ref Rng & Units 04/15/2020 05/13/2018 05/20/2014  WBC 3.4 - 10.8 x10E3/uL 7.7 8.6 8.3  Hemoglobin 13.0 - 17.7 g/dL 16.6 16.3 13.1  Hematocrit 37.5 - 51.0 % 51.0 49.3 38.2(L)  Platelets 150 - 450 x10E3/uL 189 194 191   CMP Latest Ref Rng & Units 04/15/2020 05/13/2018 06/15/2015  Glucose 65 - 99 mg/dL 127(H) 99 -  BUN 8 - 27 mg/dL 19 13 -  Creatinine 0.76 - 1.27 mg/dL 1.10 1.29(H) 1.2  Sodium 134 - 144 mmol/L 142 139 -  Potassium 3.5 - 5.2 mmol/L 4.2 4.0 -  Chloride 96 - 106 mmol/L 99 102 -  CO2 20 - 29 mmol/L 30(H) 25 -  Calcium 8.6 - 10.2 mg/dL 9.3 8.9 -  Total Protein 6.0 - 8.5 g/dL  7.0 - -  Total Bilirubin 0.0 - 1.2 mg/dL 0.2 - -  Alkaline Phos 48 - 121 IU/L 70 - -  AST 0 - 40 IU/L 24 - -  ALT 0 - 44 IU/L 38 - -      Imaging:  Within last 24 hrs: No results found.  Assessment    Umbilical/ventral hernia.  Diastases recti. Patient Active Problem List   Diagnosis Date Noted  . Prediabetes   . Statin declined 04/16/2020  . Obesity (BMI 30-39.9) 04/15/2020  . Cigarette smoker motivated to quit 04/15/2020  . Umbilical hernia without obstruction and without gangrene 04/15/2020  . Chronic kidney disease 04/15/2020  .  Abnormal findings on diagnostic imaging of lung 02/23/2020  . At risk for obstructive sleep apnea 02/23/2020  . Schizophrenia ([place])   . Hyperlipidemia   . AAA (abdominal aortic aneurysm) without rupture ([place]) 05/17/2014  . COPD (chronic obstructive pulmonary disease) ([place]) 09/16/2013  . TOBACCO USE 07/14/2008  . Attention deficit disorder 05/13/2007  . Essential hypertension 05/13/2007  . HYPERLIPIDEMIA 06/15/2006    Plan    We concur he is best suited for surgery once he is smoking cessation is complete, he would also like to utilize this opportunity to lose some weight preoperatively knowing that this will optimize his ability to have the best result feasible.  I discussed possibility of incarceration, strangulation, and the risk of emergency surgery in the face of strangulation.  Also discussed the risk of surgery including recurrence which can be up to 30% in the case of complex hernias, use of prosthetic materials (mesh) and the increased risk of infxn, post-op infxn and the possible need for re-operation and removal of mesh if used, possibility of post-op SBO or ileus, and the risks of general anesthetic including MI, CVA, sudden death or even reaction to anesthetic medications. The patient understands the risks, any and all questions were answered to the patient's satisfaction.  Offered follow-up on an interval to his liking, he would  like to follow-up again at the first of next year.   Face-to-face time spent with the patient and accompanying care providers(if present) was 45 minutes, with more than 50% of the time spent counseling, educating, and coordinating care of the patient.      Ronny Bacon M.D., FACS 06/22/2020, 1:49 PM

## 2020-06-28 DIAGNOSIS — L853 Xerosis cutis: Secondary | ICD-10-CM | POA: Diagnosis not present

## 2020-06-28 DIAGNOSIS — L298 Other pruritus: Secondary | ICD-10-CM | POA: Diagnosis not present

## 2020-07-16 DIAGNOSIS — L298 Other pruritus: Secondary | ICD-10-CM | POA: Diagnosis not present

## 2020-07-16 DIAGNOSIS — L853 Xerosis cutis: Secondary | ICD-10-CM | POA: Diagnosis not present

## 2020-07-16 DIAGNOSIS — D225 Melanocytic nevi of trunk: Secondary | ICD-10-CM | POA: Diagnosis not present

## 2020-07-18 NOTE — Progress Notes (Deleted)
Steven Hayden is a 69 y.o. male who presents for annual wellness visit and follow-up on chronic medical conditions.  He has the following concerns:   Immunization History  Administered Date(s) Administered  . Influenza Whole 06/07/2010  . Influenza,inj,Quad PF,6+ Mos 06/22/2014  . Influenza-Unspecified 09/16/2019  . PFIZER SARS-COV-2 Vaccination 11/08/2019   Last colonoscopy: Last PSA: Dentist: Ophtho: Exercise:  Other doctors caring for patient include:   Depression screen:  See questionnaire below.     Depression screen [facility] 2/9 04/15/2020 12/15/2019  Decreased Interest 0 0  Down, Depressed, Hopeless 0 0  PHQ - 2 Score 0 0    Fall Screen: See Questionaire below.   Fall Risk  06/22/2020 04/15/2020 12/15/2019  Falls in the past year? 0 0 0  Number falls in past yr: - 0 0  Injury with Fall? - - 0    ADL screen:  See questionnaire below.  Functional Status Survey:     End of Life Discussion:  Patient {ACTIONS; HAS/DOES NOT HAVE:19233} a living will and medical power of attorney   Review of Systems  Constitutional: -fever, -chills, -sweats, -unexpected weight change, -anorexia, -fatigue Allergy: -sneezing, -itching, -congestion Dermatology: denies changing moles, rash, lumps, new worrisome lesions ENT: -runny nose, -ear pain, -sore throat, -hoarseness, -sinus pain, -teeth pain, -tinnitus, -hearing loss, -epistaxis Cardiology:  -chest pain, -palpitations, -edema, -orthopnea, -paroxysmal nocturnal dyspnea Respiratory: -cough, -shortness of breath, -dyspnea on exertion, -wheezing, -hemoptysis Gastroenterology: -abdominal pain, -nausea, -vomiting, -diarrhea, -constipation, -blood in stool, -changes in bowel movement, -dysphagia Hematology: -bleeding or bruising problems Musculoskeletal: -arthralgias, -myalgias, -joint swelling, -back pain, -neck pain, -cramping, -gait changes Ophthalmology: -vision changes, -eye redness, -itching, -discharge Urology: -dysuria, -difficulty  urinating, -hematuria, -urinary frequency, -urgency, incontinence Neurology: -headache, -weakness, -tingling, -numbness, -speech abnormality, -memory loss, -falls, -dizziness Psychology:  -depressed mood, -agitation, -sleep problems   PHYSICAL EXAM:  There were no vitals taken for this visit.  General Appearance: Alert, cooperative, no distress, appears stated age Head: Normocephalic, without obvious abnormality, atraumatic Eyes: PERRL, conjunctiva/corneas clear, EOM's intact, fundi benign Ears: Normal TM's and external ear canals Nose: Nares normal, mucosa normal, no drainage or sinus   tenderness Throat: Lips, mucosa, and tongue normal; teeth and gums normal Neck: Supple, no lymphadenopathy, thyroid:no enlargement/tenderness/nodules; no carotid bruit or JVD Back: Spine nontender, no curvature, ROM normal, no CVA tenderness Lungs: Clear to auscultation bilaterally without wheezes, rales or ronchi; respirations unlabored Chest Wall: No tenderness or deformity Heart: Regular rate and rhythm, S1 and S2 normal, no murmur, rub or gallop Breast Exam: No chest wall tenderness, masses or gynecomastia Abdomen: Soft, non-tender, nondistended, normoactive bowel sounds, no masses, no hepatosplenomegaly Genitalia: Normal male external genitalia without lesions.  Testicles without masses.  No inguinal hernias. Rectal: Normal sphincter tone, no masses or tenderness; guaiac negative stool.  Prostate smooth, no nodules, not enlarged. Extremities: No clubbing, cyanosis or edema Pulses: 2+ and symmetric all extremities Skin: Skin color, texture, turgor normal, no rashes or lesions Lymph nodes: Cervical, supraclavicular, and axillary nodes normal Neurologic: CNII-XII intact, normal strength, sensation and gait; reflexes 2+ and symmetric throughout   Psych: Normal mood, affect, hygiene and grooming  ASSESSMENT/PLAN: Medicare annual wellness visit, subsequent  Routine general medical examination at a  health care facility  Essential hypertension  Prediabetes  Hyperlipidemia, unspecified hyperlipidemia type  TOBACCO USE  Chronic obstructive pulmonary disease, unspecified COPD type ([place])  AAA (abdominal aortic aneurysm) without rupture ([place])  Chronic kidney disease, unspecified CKD stage  Attention deficit disorder, unspecified hyperactivity presence  Need for hepatitis C screening test     Discussed PSA screening (risks/benefits), recommended at least 30 minutes of aerobic activity at least 5 days/week; proper sunscreen use reviewed; healthy diet and alcohol recommendations (less than or equal to 2 drinks/day) reviewed; regular seatbelt use; changing batteries in smoke detectors. Immunization recommendations discussed.  Colonoscopy recommendations reviewed.   Medicare Attestation I have personally reviewed: The patient's medical and social history Their use of alcohol, tobacco or illicit drugs Their current medications and supplements The patient's functional ability including ADLs,fall risks, home safety risks, cognitive, and hearing and visual impairment Diet and physical activities Evidence for depression or mood disorders  The patient's weight, height, and BMI have been recorded in the chart.  I have made referrals, counseling, and provided education to the patient based on review of the above and I have provided the patient with a written personalized care plan for preventive services.     [name], NP-C   07/18/2020

## 2020-07-19 ENCOUNTER — Ambulatory Visit: Payer: Medicare HMO | Admitting: Family Medicine

## 2020-07-19 DIAGNOSIS — I714 Abdominal aortic aneurysm, without rupture: Secondary | ICD-10-CM

## 2020-07-19 DIAGNOSIS — E785 Hyperlipidemia, unspecified: Secondary | ICD-10-CM

## 2020-07-19 DIAGNOSIS — I10 Essential (primary) hypertension: Secondary | ICD-10-CM

## 2020-07-19 DIAGNOSIS — N189 Chronic kidney disease, unspecified: Secondary | ICD-10-CM

## 2020-07-19 DIAGNOSIS — F988 Other specified behavioral and emotional disorders with onset usually occurring in childhood and adolescence: Secondary | ICD-10-CM

## 2020-07-19 DIAGNOSIS — J449 Chronic obstructive pulmonary disease, unspecified: Secondary | ICD-10-CM

## 2020-07-19 DIAGNOSIS — Z1159 Encounter for screening for other viral diseases: Secondary | ICD-10-CM

## 2020-07-19 DIAGNOSIS — Z0000 Encounter for general adult medical examination without abnormal findings: Secondary | ICD-10-CM

## 2020-07-19 DIAGNOSIS — R7303 Prediabetes: Secondary | ICD-10-CM

## 2020-07-19 DIAGNOSIS — F17200 Nicotine dependence, unspecified, uncomplicated: Secondary | ICD-10-CM

## 2020-07-21 ENCOUNTER — Encounter: Payer: Self-pay | Admitting: Family Medicine

## 2020-08-02 ENCOUNTER — Encounter: Payer: Self-pay | Admitting: Family Medicine

## 2020-08-02 ENCOUNTER — Other Ambulatory Visit: Payer: Self-pay | Admitting: Family Medicine

## 2020-08-02 DIAGNOSIS — F988 Other specified behavioral and emotional disorders with onset usually occurring in childhood and adolescence: Secondary | ICD-10-CM

## 2020-08-02 MED ORDER — AMPHETAMINE-DEXTROAMPHETAMINE 30 MG PO TABS: 30.0000 mg | ORAL_TABLET | Freq: Every day | ORAL | 0 refills | Status: DC

## 2020-08-04 ENCOUNTER — Encounter: Payer: Self-pay | Admitting: Family Medicine

## 2020-09-02 ENCOUNTER — Other Ambulatory Visit: Payer: Self-pay | Admitting: Family Medicine

## 2020-09-02 DIAGNOSIS — F988 Other specified behavioral and emotional disorders with onset usually occurring in childhood and adolescence: Secondary | ICD-10-CM

## 2020-09-02 MED ORDER — AMPHETAMINE-DEXTROAMPHETAMINE 30 MG PO TABS: 30.0000 mg | ORAL_TABLET | Freq: Every day | ORAL | 0 refills | Status: DC

## 2020-09-02 NOTE — Telephone Encounter (Signed)
Is this okay to fill? 

## 2020-09-20 ENCOUNTER — Encounter: Payer: Self-pay | Admitting: Family Medicine

## 2020-09-22 ENCOUNTER — Telehealth: Payer: Self-pay | Admitting: Pulmonary Disease

## 2020-09-22 NOTE — Telephone Encounter (Signed)
Called and spoke with patient, advised of recommendations per Rexene Edison NP.  Advised patient that we attempted a PA back in July of 2021 and it was denied.  Patient states that he has contacted the manufacture of Anoro and was told that he could get it for $49/month through the manufacturer.  Advised he needs an OV to discuss the Anoro.  He is currently at [facility] taking care of his mother who is sick and is unable to come to [place] for an office visit.  Could he do a televisit?  Please advise.  Thank you.

## 2020-09-22 NOTE — Telephone Encounter (Signed)
No answer.  Will need ov set up to discuss this .

## 2020-09-23 NOTE — Telephone Encounter (Signed)
09/23/2020  Patient is due for follow-up with our office.  He was last seen in June/2021.  Recommended follow-up was in September/2021.  Would recommend patient have an office visit to discuss Anoro Ellipta in person, potentially be able to provide samples in person, review inhaler use, as well as do a physical exam as patient is due for follow-up.  This can be with an APP or with Dr. Chase Caller.  Wyn Quaker, FNP

## 2020-09-23 NOTE — Telephone Encounter (Signed)
Patient has in office apt set with Wyn Quaker on 10/05/20. Nothing further needed. Routing to [place] as [employer]

## 2020-10-01 ENCOUNTER — Telehealth: Payer: Self-pay | Admitting: Pulmonary Disease

## 2020-10-01 NOTE — Telephone Encounter (Signed)
ATC patient to let him know that Aaron Edelman was recommending that patient come in for in person visit as his last one was virtual. Per DPR left detailed message with Brian's recs. Advised patient that if this appointment needed to be rescheduled to please call the office. Nothing further needed at this time.

## 2020-10-01 NOTE — Telephone Encounter (Signed)
Would recommend a in office visit to further evaluate.  Patient's last office visit was virtual.  As previously stated would recommend in person physical exam  Wyn Quaker, FNP

## 2020-10-01 NOTE — Telephone Encounter (Signed)
Called and spoke to patient.  Patient is requesting for 10/05/2020 visit to be virtual, as he is currently out of town.  Aaron Edelman, please advise. thanks

## 2020-10-05 ENCOUNTER — Ambulatory Visit: Payer: Medicare HMO | Admitting: Pulmonary Disease

## 2020-10-07 ENCOUNTER — Other Ambulatory Visit: Payer: Self-pay | Admitting: Family Medicine

## 2020-10-07 ENCOUNTER — Encounter: Payer: Self-pay | Admitting: Family Medicine

## 2020-10-07 ENCOUNTER — Ambulatory Visit: Payer: Medicare HMO | Admitting: Pulmonary Disease

## 2020-10-07 ENCOUNTER — Other Ambulatory Visit: Payer: Self-pay

## 2020-10-07 ENCOUNTER — Encounter: Payer: Self-pay | Admitting: Pulmonary Disease

## 2020-10-07 VITALS — BP 134/80 | HR 64 | Temp 97.6°F | Ht 71.0 in | Wt 262.0 lb

## 2020-10-07 DIAGNOSIS — J449 Chronic obstructive pulmonary disease, unspecified: Secondary | ICD-10-CM

## 2020-10-07 DIAGNOSIS — Z9189 Other specified personal risk factors, not elsewhere classified: Secondary | ICD-10-CM

## 2020-10-07 DIAGNOSIS — F988 Other specified behavioral and emotional disorders with onset usually occurring in childhood and adolescence: Secondary | ICD-10-CM

## 2020-10-07 DIAGNOSIS — R918 Other nonspecific abnormal finding of lung field: Secondary | ICD-10-CM

## 2020-10-07 DIAGNOSIS — F17200 Nicotine dependence, unspecified, uncomplicated: Secondary | ICD-10-CM | POA: Diagnosis not present

## 2020-10-07 DIAGNOSIS — I714 Abdominal aortic aneurysm, without rupture, unspecified: Secondary | ICD-10-CM

## 2020-10-07 MED ORDER — AMPHETAMINE-DEXTROAMPHETAMINE 30 MG PO TABS: 30.0000 mg | ORAL_TABLET | Freq: Every day | ORAL | 0 refills | Status: DC

## 2020-10-07 MED ORDER — STIOLTO RESPIMAT 2.5-2.5 MCG/ACT IN AERS: 2.0000 | INHALATION_SPRAY | Freq: Every day | RESPIRATORY_TRACT | 0 refills | Status: DC

## 2020-10-07 MED ORDER — STIOLTO RESPIMAT 2.5-2.5 MCG/ACT IN AERS: 2.0000 | INHALATION_SPRAY | Freq: Every day | RESPIRATORY_TRACT | 5 refills | Status: DC

## 2020-10-07 NOTE — Assessment & Plan Note (Signed)
Reviewed pulmonary function testing with patient Stage III COPD Emphasized need to stop smoking  Plan: Emphasized need to stop smoking Nicotine replacement therapies Stop Spiriva Respimat, start Stiolto Respimat Follow-up with our office in 2 to 3 months

## 2020-10-07 NOTE — Assessment & Plan Note (Signed)
Plan: Patient to schedule follow-up with vascular team last seen in 2018 

## 2020-10-07 NOTE — Assessment & Plan Note (Signed)
>>  ASSESSMENT AND PLAN FOR AAA (ABDOMINAL AORTIC ANEURYSM) WITHOUT RUPTURE (HCC) WRITTEN ON 10/07/2020  4:58 PM BY MACK, BRIAN P, NP  Plan: Patient to schedule follow-up with vascular team last seen in 2018

## 2020-10-07 NOTE — Assessment & Plan Note (Signed)
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

## 2020-10-07 NOTE — Assessment & Plan Note (Signed)
Pending home sleep study ordered by Dr. O  Plan: Patient to complete home sleep study as ordered 

## 2020-10-07 NOTE — Patient Instructions (Addendum)
You were seen today by Lauraine Rinne, NP  for:   1. Chronic obstructive pulmonary disease, unspecified COPD type (HCC)  STOP Spiriva   START Stiolto Respimat inhaler >>>2 puffs daily >>>Take this no matter what >>>This is not a rescue inhaler  Only use your albuterol as a rescue medication to be used if you can't catch your breath by resting or doing a relaxed purse lip breathing pattern.  - The less you use it, the better it will work when you need it. - Ok to use up to 2 puffs  every 4 hours if you must but call for immediate appointment if use goes up over your usual need - Don't leave home without it !!  (think of it like the spare tire for your car)   Note your daily symptoms > remember "red flags" for COPD:   >>>Increase in cough >>>increase in sputum production >>>increase in shortness of breath or activity  intolerance.   If you notice these symptoms, please call the office to be seen.   2. Abnormal findings on diagnostic imaging of lung  We'll plan on repeating high-resolution CT chest in May/2022  3. TOBACCO USE  We recommend that you stop smoking.  >>>You need to set a quit date >>>If you have friends or family who smoke, let them know you are trying to quit and not to smoke around you or in your living environment  Smoking Cessation Resources:  1 800 QUIT NOW  >>> Patient to call this resource and utilize it to help support her quit smoking >>> Keep up your hard work with stopping smoking  You can also contact the [facility] >>>For smoking cessation classes call [phone]  We do not recommend using e-cigarettes as a form of stopping smoking  You can sign up for smoking cessation support texts and information:  >>>https://smokefree.gov/smokefreetxt  Nicotine patches: >>>Make sure you rotate sites that you do not get skin irritation, Apply 1 patch each morning to a non-hairy skin site  If you are smoking greater than 10 cigarettes/day and  weigh over 45 kg start with the nicotine patch of 21 mg a day for 6 weeks, then 14 mg a day for 2 weeks, then finished with 7 mg a day for 2 weeks, then stop  If you are smoking less than 10 cigarettes a day or weight less than 45 kg start with medium dose pack of 14 mg a day for 6 weeks, followed by 7 mg a day for 2 weeks   >>>If insomnia occurs you are having trouble sleeping you can take the patch off at night, and place a new one on in the morning >>>If the patch is removed at night and you have morning cravings start short acting nicotine replacement therapy such as gum or lozenges   >>>Avoid acidic beverages such as coffee, carbonated beverages before and during gum use.  A soft acidic beverages lower oral pH which cause nicotine to not be absorbed properly >>>If you chew the gum too quickly or vigorously you could have nausea, vomiting, abdominal pain, constipation, hiccups, headache, sore jaw, mouth irritation ulcers  Nicotine lozenge: Lozenges are commonly uses short acting NRT product  >>>Smokers who smoke within 30 minutes of awakening should use 4 mg dose >>>Smokers who wait more than 30 minutes after awakening to smoke should use 2 mg dose  Can use up to 1 lozenge every 1-2 hours for 6 weeks >>>Total amount of lozenges that can be  used per day as 20 >>>Gradually reduce number of lozenges used per day after 2 weeks of use  Place lozenge in mouth and allowed to dissolve for 30 minutes loss and does not need to be chewed  Lozenges have advantages to be able to be used in people with TMG, poor dentition, dentures   4. AAA (abdominal aortic aneurysm) without rupture ([place])  Continue follow-up with cardiology  5. At risk for obstructive sleep apnea  We will contact our patient care coordinators regarding your home sleep study that was ordered last year by Dr. Jenetta Downer   We recommend today:   Meds ordered this encounter  Medications  . DISCONTD: Tiotropium Bromide-Olodaterol  (STIOLTO RESPIMAT) 2.5-2.5 MCG/ACT AERS    Sig: Inhale 2 puffs into the lungs daily.    Dispense:  4 g    Refill:  0    Order Specific Question:   Lot Number?    [id number]    Order Specific Question:   Expiration Date?    Answer:   04/04/2022    Order Specific Question:   Quantity    Answer:   2  . Tiotropium Bromide-Olodaterol (STIOLTO RESPIMAT) 2.5-2.5 MCG/ACT AERS    Sig: Inhale 2 puffs into the lungs daily.    Dispense:  4 g    Refill:  5    Order Specific Question:   Lot Number?    [id number]    Order Specific Question:   Expiration Date?    Answer:   04/04/2022    Order Specific Question:   Quantity    Answer:   2    Follow Up:    Return in about 6 weeks (around 11/18/2020), or if symptoms worsen or fail to improve, for Follow up with Wyn Quaker FNP-C, Follow up with Dr. Purnell Shoemaker. Please also schedule sleep follow-up with Dr. Jenetta Downer, after home sleep study is completed  Notification of test results are managed in the following manner: If there are  any recommendations or changes to the  plan of care discussed in office today,  we will contact you and let you know what they are. If you do not hear from [place], then your results are normal and you can view them through your  MyChart account , or a letter will be sent to you. Thank you again for trusting [place] with your care  - Thank you, [facility]    It is flu season:   >>> Best ways to protect herself from the flu: Receive the yearly flu vaccine, practice good hand hygiene washing with soap and also using hand sanitizer when available, eat a nutritious meals, get adequate rest, hydrate appropriately       Please contact the office if your symptoms worsen or you have concerns that you are not improving.   Thank you for choosing [facility] for your healthcare, and for allowing [place] to partner with you on your healthcare journey. I am thankful to be able to provide care to you today.   Wyn Quaker  FNP-C   American Journal of Respiratory and Critical Care Medicine, [phone]), e5-e31. http://knight.com/.Z9080895  Health Risks of Smoking Smoking tobacco is very bad for your health. Tobacco smoke contains many toxic chemicals that can damage every part of your body. Secondhand smoke can be harmful to those around you. Tobacco or nicotine use can cause many long-term (chronic) diseases. Smoking is difficult to quit because a chemical in tobacco, called nicotine, causes addiction  or dependence. When you smoke and inhale, nicotine is absorbed quickly into the bloodstream through your lungs. Both inhaled and non-inhaled nicotine may be addictive. How can quitting affect me? There are health benefits of quitting smoking. Some benefits happen right away and others take time. Benefits may include:  Blood flow, blood pressure, heart rate, and lung capacity may begin to improve. However, any lung damage that has already occurred cannot be repaired.  Temporary respiratory symptoms, such as nasal congestion and cough, may improve over time.  Your risk of heart disease, stroke, and cancer is reduced.  The overall quality of your health may improve.  You may save money, as you will not spend money on tobacco products and may spend less money on smoking-related health issues. What can increase my risk? Smoking harms nearly every organ in the body. People who smoke tobacco have a shorter life expectancy and an increased risk of many serious medical problems. These include:  More respiratory infections, such as colds and pneumonia.  Cancer.  Heart disease.  Stroke.  Chronic respiratory diseases.  Delayed wound healing and increased risk of complications during surgery.  Problems with reproduction, pregnancy, and childbirth, such as infertility, early (premature) births, stillbirths, and birth defects. Secondhand smoke exposure to children increases the risk of:  Sudden infant  death syndrome (SIDS).  Infections in the nose, throat, or airways (respiratory infections).  Chronic respiratory symptoms.   What actions can I take to quit? Smoking is an addiction that affects both your body and your mind, and long-time habits can be hard to change. Your health care provider can recommend:  Nicotine replacement products, such as patches, gum, and nasal sprays. Use these products only as directed. Do not replace cigarette smoking with electronic cigarettes, which are commonly called e-cigarettes. The safety of e-cigarettes is not known, and some may contain harmful chemicals.  Programs and community resources, which may include group support, education, or talk therapy.  Prescription medicines to help reduce cravings.  A combination of two or more quit methods, which will increase the success of quitting.   Where to find support Follow the recommendations from your health care provider about support groups and other assistance. You can also visit:  [employer]: www.naquitline.org or call 1-800-QUIT-NOW.  U.S. Department of Health and Human Services: www.smokefree.gov  American Lung Association: www.freedomfromsmoking.org  American Heart Association: www.heart.org Where to find more information  Centers for Disease Control and Prevention: http://www.wolf.info/  World Health Organization: RoleLink.com.br Summary  Smoking tobacco is very bad for your health. Tobacco smoke contains many toxic chemicals that can damage every part of the body.  Smoking is difficult to quit because a chemical in tobacco, called nicotine, causes addiction or dependence.  There are immediate and long-term health benefits of quitting smoking.  A combination of two or more quit methods increases the success of quitting. This information is not intended to replace advice given to you by your health care provider. Make sure you discuss any questions you have with your health care  provider. Document Revised: 10/06/2019 Document Reviewed: 10/06/2019 Elsevier Patient Education  2021 [employer].

## 2020-10-07 NOTE — Progress Notes (Signed)
@Patient  ID: Steven Hayden, male    DOB: [birth year], 70 y.o.   MRN: [id number]  Chief Complaint  Patient presents with  . Follow-up    Pt states he has been doing okay since last visit. States he still becomes SOB with activities. Denies any complaints of coughing or wheezing. Pt states that he did try the Anoro inhaler which did work better than Spiriva but states he wants to discuss Trelegy.    Referring provider: Girtha Rm, NP-C  HPI:  70 year old male current everyday smoker followed in our office for COPD  Past medical history: Hypertension, schizophrenia, hyperlipidemia Smoking history: Current everyday smoker, 1 packs/day, 125-pack-year smoking history Maintenance: Spiriva Handihaler  Patient of Dr. Chase Caller for pulmonary, patient of Dr. Jenetta Downer for sleep  10/07/2020  - Visit   70 year old male current everyday smoker fonder office for COPD.  Patient completing follow-up with our office today.  Patient was last seen virtually in our office in June/2021.  Plan of care at that point in time was strongly recommending the patient work on stopping smoking.  Patient to follow-up with our office in 3 months.  Encourage patient to remain on Spiriva HandiHaler but inquire about Anoro Ellipta cost.  Patient is encouraged to schedule follow-up with vascular team due to AAA found on high-resolution CT chest imaging.  Patient had pending home sleep study.  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  Patient reporting that after speaking with myself in June/2021 he stopped smoking for 3 weeks.  He reports that he "felt so much better with his breathing".  Unfortunately patient then resume smoking.  Patient reports that he is back to feeling "bad again".  Patient continues to have issues with management of his inhalers.  He was on Spiriva HandiHaler, he is now on Spiriva Respimat.  He was  previously suggested to be trialed on Stiolto Respimat but we are unable to reach the patient.  Patient has a history of utilizing his father's Anoro Ellipta and he felt that this was helpful.  Unfortunately his insurance does not cover Anoro Ellipta.  The patient reports that he purchased access to a patient assistance program for [employer].  He reports that he spent $150 out-of-pocket.  He is unsure if this is truly a patient assistance program.  We'll discuss this today.  Patient continues to smoke 1 pack/day.  He is interested in stopping smoking.  Patient reports that he occasionally has to use his rescue inhaler sometimes 0-2 times a day.  He has no acute respiratory symptoms at this point in time except for some exertional fatigue.  He has been [place] of [place] ailing [place] the beach.  Patient also reporting he is currently working with dermatology for an ongoing rash.  Questionaires / Pulmonary Flowsheets:   ACT:  No flowsheet data found.  MMRC: mMRC Dyspnea Scale mMRC Score  10/07/2020 2    Epworth:  Results of the Epworth flowsheet 02/16/2020  Sitting and reading 0  Watching TV 0  Sitting, inactive in a public place (e.g. a theatre or a meeting) 0  As a passenger in a car for an hour without a break 0  Lying down to rest in the afternoon when circumstances permit 0  Sitting and talking to someone 0  Sitting quietly after a lunch without alcohol 0  In a car, while stopped for a few minutes  in traffic 0  Total score 0    Tests:   01/29/2020-CT chest high-res-mild centrilobular emphysema with diffuse bronchial wall thickening and saber-sheath trachea suggesting COPD, minimal patchy subpleural reticulation and groundglass opacity in lower lungs, showing mild interstitial lung disease such as NSIP or early UIP, will repeat CT chest in 12 months, ectatic 4.4 cm ascending thoracic aorta, recommend annual imaging, one-vessel aortic  arthrosclerosis  02/20/2020-pulmonary function test-FVC 2.30 (58% predicted), postbronchodilator ratio 56, postbronchodilator FEV1 1.24 (43% predicted), DLCO 18.16 (77% predicted)  FENO:  No results found for: NITRICOXIDE  PFT: PFT Results Latest Ref Rng & Units 02/20/2020  FVC-Pre L 2.30  FVC-Predicted Pre % 58  FVC-Post L 2.23  FVC-Predicted Post % 56  Pre FEV1/FVC % % 57  Post FEV1/FCV % % 56  FEV1-Pre L 1.32  FEV1-Predicted Pre % 45  FEV1-Post L 1.24  DLCO uncorrected ml/min/mmHg 18.16  DLCO UNC% % 77  DLCO corrected ml/min/mmHg 18.16  DLCO COR %Predicted % 77  DLVA Predicted % 112  TLC L 5.29  TLC % Predicted % 83  RV % Predicted % 139    WALK:  SIX MIN WALK 01/08/2020  Supplimental Oxygen during Test? (L/min) No  Tech Comments: Pt walked at an average pace completing only two laps due to moderate SOB.    Imaging: No results found.  Lab Results:  CBC    Component Value Date/Time   WBC 7.7 04/15/2020 1028   WBC 8.6 05/13/2018 1130   RBC 5.33 04/15/2020 1028   RBC 5.03 05/13/2018 1130   HGB 16.6 04/15/2020 1028   HCT 51.0 04/15/2020 1028   PLT 189 04/15/2020 1028   MCV 96 04/15/2020 1028   MCH 31.1 04/15/2020 1028   MCH 32.4 05/13/2018 1130   MCHC 32.5 04/15/2020 1028   MCHC 33.1 05/13/2018 1130   RDW 13.7 04/15/2020 1028   LYMPHSABS 3.1 04/15/2020 1028   EOSABS 0.1 04/15/2020 1028   BASOSABS 0.0 04/15/2020 1028    BMET    Component Value Date/Time   NA 142 04/15/2020 1028   K 4.2 04/15/2020 1028   CL 99 04/15/2020 1028   CO2 30 (H) 04/15/2020 1028   GLUCOSE 127 (H) 04/15/2020 1028   GLUCOSE 99 05/13/2018 1130   BUN 19 04/15/2020 1028   CREATININE 1.10 04/15/2020 1028   CREATININE 1.2 06/15/2015 1232   CALCIUM 9.3 04/15/2020 1028   GFRNONAA 69 04/15/2020 1028   GFRAA 79 04/15/2020 1028    BNP No results found for: BNP  ProBNP No results found for: PROBNP  Specialty Problems      Pulmonary Problems   COPD (chronic obstructive  pulmonary disease) (HCC)      Allergies  Allergen Reactions  . Codeine Phosphate     REACTION: swelling of uvula, hives  . Dilaudid [Hydromorphone]     Immunization History  Administered Date(s) Administered  . Influenza Whole 06/07/2010  . Influenza,inj,Quad PF,6+ Mos 06/22/2014, 05/29/2016, 06/08/2017, 09/13/2018  . Influenza-Unspecified 09/16/2019  . PFIZER(Purple Top)SARS-COV-2 Vaccination 11/08/2019, 08/26/2020  . Pneumococcal Conjugate-13 06/08/2017    Past Medical History:  Diagnosis Date  . AAA (abdominal aortic aneurysm) ([place])   . ADD (attention deficit disorder)   . Atrial fibrillation ([place])   . Bipolar affective disorder ([place])   . COPD (chronic obstructive pulmonary disease) ([place])   . Hyperlipidemia   . Hypertension   . Prediabetes    Hgb A1c 6.4% August 2021  . Schizophrenia ([place])   . Statin declined 04/16/2020  States he will not take a statin. Has taken them in past.   . Tobacco abuse     Tobacco History: Social History   Tobacco Use  Smoking Status Current Some Day Smoker  . Packs/day: 2.00  . Years: 50.00  . Pack years: 100.00  . Types: Cigarettes  Smokeless Tobacco Never Used  Tobacco Comment   currently smoking 1ppd as of 10/07/20   Ready to quit: Not Answered Counseling given: Not Answered Comment: currently smoking 1ppd as of 10/07/20   Continue to not smoke  Outpatient Encounter Medications as of 10/07/2020  Medication Sig  . albuterol (VENTOLIN HFA) 108 (90 Base) MCG/ACT inhaler Inhale 2 puffs into the lungs every 6 (six) hours as needed for wheezing or shortness of breath.  . amphetamine-dextroamphetamine (ADDERALL) 30 MG tablet Take 1 tablet by mouth daily.  Marland Kitchen ipratropium-albuterol (DUONEB) 0.5-2.5 (3) MG/3ML SOLN   . lisinopril-hydrochlorothiazide (PRINZIDE,ZESTORETIC) 20-25 MG per tablet TAKE TWO TABLETS BY MOUTH DAILY  . triamcinolone ointment (KENALOG) 0.1 % SMARTSIG:1 Sparingly Topical Twice Daily  . [DISCONTINUED] tiotropium  (SPIRIVA HANDIHALER) 18 MCG inhalation capsule Place 1 capsule (18 mcg total) into inhaler and inhale daily.  . [DISCONTINUED] Tiotropium Bromide-Olodaterol (STIOLTO RESPIMAT) 2.5-2.5 MCG/ACT AERS Inhale 2 puffs into the lungs daily.  . nicotine polacrilex (COMMIT) 4 MG lozenge Take 1 lozenge (4 mg total) by mouth as needed for smoking cessation. (Patient not taking: Reported on 10/07/2020)  . Tiotropium Bromide-Olodaterol (STIOLTO RESPIMAT) 2.5-2.5 MCG/ACT AERS Inhale 2 puffs into the lungs daily.   No facility-administered encounter medications on file as of 10/07/2020.     Review of Systems  Review of Systems  Constitutional: Positive for fatigue. Negative for activity change, chills, fever and unexpected weight change.  HENT: Negative for postnasal drip, rhinorrhea, sinus pressure, sinus pain and sore throat.   Eyes: Negative.   Respiratory: Negative for cough, shortness of breath and wheezing.   Cardiovascular: Negative for chest pain and palpitations.  Gastrointestinal: Negative for constipation, diarrhea, nausea and vomiting.  Endocrine: Negative.   Genitourinary: Negative.   Musculoskeletal: Negative.   Skin: Negative.   Neurological: Negative for dizziness and headaches.  Psychiatric/Behavioral: Negative.  Negative for dysphoric mood. The patient is not nervous/anxious.   All other systems reviewed and are negative.    Physical Exam  BP 134/80 (BP Location: Left Arm, Cuff Size: Large)   Pulse 64   Temp 97.6 F (36.4 C) (Other (Comment)) Comment (Src): wrist  Ht 5\' 11"  (1.803 m)   Wt 262 lb (118.8 kg)   SpO2 97%   BMI 36.54 kg/m   Wt Readings from Last 5 Encounters:  10/07/20 262 lb (118.8 kg)  06/22/20 255 lb (115.7 kg)  04/22/20 265 lb 8 oz (120.4 kg)  04/15/20 270 lb 12.8 oz (122.8 kg)  02/16/20 266 lb (120.7 kg)    BMI Readings from Last 5 Encounters:  10/07/20 36.54 kg/m  06/22/20 35.57 kg/m  04/22/20 37.03 kg/m  04/15/20 37.77 kg/m  02/16/20 37.10  kg/m     Physical Exam Vitals and nursing note reviewed.  Constitutional:      General: He is not in acute distress.    Appearance: Normal appearance. He is obese.  HENT:     Head: Normocephalic and atraumatic.     Right Ear: Hearing and external ear normal.     Left Ear: Hearing and external ear normal.     Nose: Nose normal. No mucosal edema or rhinorrhea.     Right Turbinates: Not  enlarged.     Left Turbinates: Not enlarged.     Mouth/Throat:     Mouth: Mucous membranes are dry.     Pharynx: Oropharynx is clear. No oropharyngeal exudate.  Eyes:     Pupils: Pupils are equal, round, and reactive to light.  Cardiovascular:     Rate and Rhythm: Normal rate and regular rhythm.     Pulses: Normal pulses.     Heart sounds: Normal heart sounds. No murmur heard.   Pulmonary:     Effort: Pulmonary effort is normal.     Breath sounds: Wheezing (exp wheeze) present. No decreased breath sounds or rales.  Musculoskeletal:     Cervical back: Normal range of motion.     Right lower leg: No edema.     Left lower leg: No edema.  Lymphadenopathy:     Cervical: No cervical adenopathy.  Skin:    General: Skin is warm and dry.     Capillary Refill: Capillary refill takes less than 2 seconds.     Findings: No erythema or rash.  Neurological:     General: No focal deficit present.     Mental Status: He is alert and oriented to person, place, and time.     Motor: No weakness.     Coordination: Coordination normal.     Gait: Gait is intact. Gait normal.  Psychiatric:        Mood and Affect: Mood normal.        Behavior: Behavior normal. Behavior is cooperative.        Thought Content: Thought content normal.        Judgment: Judgment normal.       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 Jenetta Downer  Plan: Patient to complete home sleep study as ordered  TOBACCO USE Plan: restart nicotine replacement therapies Emphasized need to stop smoking Utilize reduce to quit method Set quit date Goal to stop smoking completely by next follow-up    Return in about 6 weeks (around 11/18/2020), or if symptoms worsen or fail to improve, for Follow up with Dr. Elsworth Soho, Follow up with Wyn Quaker FNP-C.   Lauraine Rinne, NP 10/07/2020   This appointment required 32 minutes of patient care (this includes precharting, chart review, review of results, face-to-face care, etc.).

## 2020-10-07 NOTE — Assessment & Plan Note (Signed)
Plan: restart nicotine replacement therapies Emphasized need to stop smoking Utilize reduce to quit method Set quit date Goal to stop smoking completely by next follow-up

## 2020-10-14 ENCOUNTER — Other Ambulatory Visit: Payer: Self-pay

## 2020-10-14 ENCOUNTER — Ambulatory Visit: Payer: Medicare HMO

## 2020-10-14 DIAGNOSIS — R0683 Snoring: Secondary | ICD-10-CM

## 2020-10-14 DIAGNOSIS — G478 Other sleep disorders: Secondary | ICD-10-CM

## 2020-10-14 DIAGNOSIS — G4733 Obstructive sleep apnea (adult) (pediatric): Secondary | ICD-10-CM

## 2020-10-14 DIAGNOSIS — J449 Chronic obstructive pulmonary disease, unspecified: Secondary | ICD-10-CM

## 2020-10-19 NOTE — Telephone Encounter (Signed)
10/19/2020  Completely agree patient needs to work with dermatology to resolve current issues.  Once he is no longer dealing with these then we can try to repeat a sleep study.  Wyn Quaker FNP

## 2020-10-19 NOTE — Telephone Encounter (Signed)
BPM pt sent this email in and stated that he was not able to do the sleep study due to itching all night long.  He is seeing [facility] for this issue.  Can we just have him let us know once this issue is dealt with and he can try the sleep study again? Thanks

## 2020-10-21 DIAGNOSIS — G4733 Obstructive sleep apnea (adult) (pediatric): Secondary | ICD-10-CM | POA: Diagnosis not present

## 2020-10-22 ENCOUNTER — Telehealth: Payer: Self-pay | Admitting: Pulmonary Disease

## 2020-10-22 NOTE — Telephone Encounter (Signed)
ATC patient to let him know of sleep study results, per DPR left detailed message with results and advised patient to call back to discuss this further. Order can be placed once we talk to patient

## 2020-10-22 NOTE — Telephone Encounter (Signed)
Call patient  Sleep study result  Patient did study on 2 different days on 2/10 and 2/14 Both studies show similar findings  Date of study: 2/14  Impression: Mild obstructive sleep apnea Mild oxygen desaturations  Recommendation: Options of treatment for mild obstructive sleep apnea will include 1.  CPAP therapy 2.  Oral device-we will require referral to dentist for evaluation 3.  Watchful waiting with aggressive weight loss measures-weight loss of 10 to 15% will be needed  I will recommend CPAP therapy with nonrestorative sleep  Auto CPAP 5-15 will be recommended pressures  Patient may choose the other modes of treatment as well

## 2020-10-27 DIAGNOSIS — L2084 Intrinsic (allergic) eczema: Secondary | ICD-10-CM | POA: Diagnosis not present

## 2020-11-05 ENCOUNTER — Ambulatory Visit: Payer: Medicare HMO | Admitting: Family Medicine

## 2020-11-17 ENCOUNTER — Encounter: Payer: Self-pay | Admitting: Family Medicine

## 2020-11-17 ENCOUNTER — Other Ambulatory Visit: Payer: Self-pay | Admitting: Family Medicine

## 2020-11-17 DIAGNOSIS — L2084 Intrinsic (allergic) eczema: Secondary | ICD-10-CM | POA: Diagnosis not present

## 2020-11-17 DIAGNOSIS — F988 Other specified behavioral and emotional disorders with onset usually occurring in childhood and adolescence: Secondary | ICD-10-CM

## 2020-11-17 MED ORDER — AMPHETAMINE-DEXTROAMPHETAMINE 30 MG PO TABS: 30.0000 mg | ORAL_TABLET | Freq: Every day | ORAL | 0 refills | Status: DC

## 2020-11-24 ENCOUNTER — Ambulatory Visit: Payer: Medicare HMO | Admitting: Internal Medicine

## 2020-11-25 ENCOUNTER — Ambulatory Visit: Payer: Medicare HMO | Admitting: Internal Medicine

## 2020-12-03 DIAGNOSIS — L308 Other specified dermatitis: Secondary | ICD-10-CM | POA: Diagnosis not present

## 2020-12-03 DIAGNOSIS — L2084 Intrinsic (allergic) eczema: Secondary | ICD-10-CM | POA: Diagnosis not present

## 2020-12-03 DIAGNOSIS — D485 Neoplasm of uncertain behavior of skin: Secondary | ICD-10-CM | POA: Diagnosis not present

## 2020-12-03 DIAGNOSIS — R21 Rash and other nonspecific skin eruption: Secondary | ICD-10-CM | POA: Diagnosis not present

## 2020-12-07 DIAGNOSIS — L308 Other specified dermatitis: Secondary | ICD-10-CM | POA: Diagnosis not present

## 2020-12-13 DIAGNOSIS — L2084 Intrinsic (allergic) eczema: Secondary | ICD-10-CM | POA: Diagnosis not present

## 2020-12-20 ENCOUNTER — Telehealth: Payer: Self-pay

## 2020-12-20 ENCOUNTER — Other Ambulatory Visit: Payer: Self-pay | Admitting: Family Medicine

## 2020-12-20 ENCOUNTER — Encounter: Payer: Self-pay | Admitting: *Deleted

## 2020-12-20 DIAGNOSIS — F988 Other specified behavioral and emotional disorders with onset usually occurring in childhood and adolescence: Secondary | ICD-10-CM

## 2020-12-20 MED ORDER — AMPHETAMINE-DEXTROAMPHETAMINE 30 MG PO TABS: 30.0000 mg | ORAL_TABLET | Freq: Every day | ORAL | 0 refills | Status: DC

## 2020-12-20 NOTE — Telephone Encounter (Signed)
Pt. Stated the HT in [place] address [address].

## 2020-12-20 NOTE — Telephone Encounter (Signed)
I called pt. Back and he said it needs to go to the [facility] in [place] address [address].

## 2020-12-20 NOTE — Telephone Encounter (Signed)
Pt. Called stating he has to cancel his [place] on 12/31/20 because [place] the [place] his truck is broke down and they can't start working on it until 12/30/20. I got him rescheduled on 03/08/21 but he stated he needs a refill on his Adderall now to the [facility]. He stated he was sorry but could not help that his truck broke down.

## 2020-12-20 NOTE — Progress Notes (Signed)
   Subjective:    Patient ID: MALIC ROSTEN, male    DOB: 1950/09/15, 70 y.o.   MRN: [id number]  HPI    Review of Systems     Objective:   Physical Exam        Assessment & Plan:

## 2020-12-31 ENCOUNTER — Ambulatory Visit: Payer: Medicare HMO | Admitting: Family Medicine

## 2021-01-13 DIAGNOSIS — L2084 Intrinsic (allergic) eczema: Secondary | ICD-10-CM | POA: Diagnosis not present

## 2021-01-15 ENCOUNTER — Encounter: Payer: Self-pay | Admitting: Family Medicine

## 2021-01-19 DIAGNOSIS — R0789 Other chest pain: Secondary | ICD-10-CM | POA: Diagnosis not present

## 2021-01-19 DIAGNOSIS — J449 Chronic obstructive pulmonary disease, unspecified: Secondary | ICD-10-CM | POA: Diagnosis not present

## 2021-01-19 DIAGNOSIS — I4891 Unspecified atrial fibrillation: Secondary | ICD-10-CM | POA: Diagnosis not present

## 2021-01-19 DIAGNOSIS — R079 Chest pain, unspecified: Secondary | ICD-10-CM | POA: Diagnosis not present

## 2021-01-19 DIAGNOSIS — F17210 Nicotine dependence, cigarettes, uncomplicated: Secondary | ICD-10-CM | POA: Diagnosis not present

## 2021-01-19 DIAGNOSIS — S199XXA Unspecified injury of neck, initial encounter: Secondary | ICD-10-CM | POA: Diagnosis not present

## 2021-01-19 DIAGNOSIS — I10 Essential (primary) hypertension: Secondary | ICD-10-CM | POA: Diagnosis not present

## 2021-01-19 DIAGNOSIS — R739 Hyperglycemia, unspecified: Secondary | ICD-10-CM | POA: Diagnosis not present

## 2021-01-19 DIAGNOSIS — L209 Atopic dermatitis, unspecified: Secondary | ICD-10-CM | POA: Diagnosis not present

## 2021-01-19 DIAGNOSIS — E860 Dehydration: Secondary | ICD-10-CM | POA: Diagnosis not present

## 2021-01-19 DIAGNOSIS — R0600 Dyspnea, unspecified: Secondary | ICD-10-CM | POA: Diagnosis not present

## 2021-01-19 DIAGNOSIS — N3289 Other specified disorders of bladder: Secondary | ICD-10-CM | POA: Diagnosis not present

## 2021-01-19 DIAGNOSIS — D750 Familial erythrocytosis: Secondary | ICD-10-CM | POA: Diagnosis not present

## 2021-01-19 DIAGNOSIS — R0602 Shortness of breath: Secondary | ICD-10-CM | POA: Diagnosis not present

## 2021-01-19 DIAGNOSIS — N289 Disorder of kidney and ureter, unspecified: Secondary | ICD-10-CM | POA: Diagnosis not present

## 2021-01-19 DIAGNOSIS — E669 Obesity, unspecified: Secondary | ICD-10-CM | POA: Diagnosis not present

## 2021-01-20 ENCOUNTER — Encounter: Payer: Self-pay | Admitting: Family Medicine

## 2021-01-24 ENCOUNTER — Other Ambulatory Visit: Payer: Self-pay

## 2021-01-24 DIAGNOSIS — I714 Abdominal aortic aneurysm, without rupture, unspecified: Secondary | ICD-10-CM

## 2021-02-03 ENCOUNTER — Other Ambulatory Visit: Payer: Self-pay

## 2021-02-03 ENCOUNTER — Encounter: Payer: Self-pay | Admitting: Family Medicine

## 2021-02-03 ENCOUNTER — Ambulatory Visit: Payer: Medicare HMO | Admitting: Family Medicine

## 2021-02-03 VITALS — BP 110/62 | HR 62 | Wt 270.0 lb

## 2021-02-03 DIAGNOSIS — I480 Paroxysmal atrial fibrillation: Secondary | ICD-10-CM

## 2021-02-03 DIAGNOSIS — L299 Pruritus, unspecified: Secondary | ICD-10-CM

## 2021-02-03 DIAGNOSIS — I714 Abdominal aortic aneurysm, without rupture, unspecified: Secondary | ICD-10-CM

## 2021-02-03 DIAGNOSIS — F209 Schizophrenia, unspecified: Secondary | ICD-10-CM | POA: Diagnosis not present

## 2021-02-03 DIAGNOSIS — Z09 Encounter for follow-up examination after completed treatment for conditions other than malignant neoplasm: Secondary | ICD-10-CM | POA: Diagnosis not present

## 2021-02-03 DIAGNOSIS — R7303 Prediabetes: Secondary | ICD-10-CM

## 2021-02-03 DIAGNOSIS — I10 Essential (primary) hypertension: Secondary | ICD-10-CM | POA: Diagnosis not present

## 2021-02-03 DIAGNOSIS — R079 Chest pain, unspecified: Secondary | ICD-10-CM

## 2021-02-03 DIAGNOSIS — L2084 Intrinsic (allergic) eczema: Secondary | ICD-10-CM | POA: Diagnosis not present

## 2021-02-03 DIAGNOSIS — H9193 Unspecified hearing loss, bilateral: Secondary | ICD-10-CM

## 2021-02-03 DIAGNOSIS — F988 Other specified behavioral and emotional disorders with onset usually occurring in childhood and adolescence: Secondary | ICD-10-CM | POA: Diagnosis not present

## 2021-02-03 DIAGNOSIS — R440 Auditory hallucinations: Secondary | ICD-10-CM

## 2021-02-03 DIAGNOSIS — J449 Chronic obstructive pulmonary disease, unspecified: Secondary | ICD-10-CM | POA: Diagnosis not present

## 2021-02-03 DIAGNOSIS — Z7901 Long term (current) use of anticoagulants: Secondary | ICD-10-CM

## 2021-02-03 MED ORDER — LISINOPRIL 20 MG PO TABS: 20.0000 mg | ORAL_TABLET | Freq: Every day | ORAL | 1 refills | Status: DC

## 2021-02-03 MED ORDER — AMPHETAMINE-DEXTROAMPHETAMINE 30 MG PO TABS: 30.0000 mg | ORAL_TABLET | Freq: Every day | ORAL | 0 refills | Status: DC

## 2021-02-03 NOTE — Progress Notes (Signed)
Subjective:    Patient ID: Steven Hayden, male    DOB: 06-Jan-1951, 70 y.o.   MRN: [id number]  HPI Chief Complaint  Patient presents with  . Hospitalization Follow-up    Hospital follow-up for a-fib. Wants to know if lisinopril-hctz causing severe itching   States he was in the hospital with chest pain at [facility]. Discharged 2 weeks ago. I do not have these records.  States his pulse was 190 and he was in Afib. Denies previous hx of Afib. States he is now on Eliquis.   Denies having chest pain since or palpitations. No worsening DOE.  Denies seeing a cardiologist.  He is under the care of vascular for AAA and pulmonologist for COPD  Denies fever, chills, dizziness, abdominal pain, N/V/D, urinary symptoms, LE edema.    States he needs a refill on Adderall. States he does not feel that it is working as well as in the past.  Reports needing to see a Teacher, music. States he is concerned about his mental health. Reports hearing voices but they are not "telling me to hurt anyone or anything bad". States he also sees people in the room, sees relatives that have died.  Denies SI or HI.   Depression screen [facility] 2/9 02/03/2021 04/15/2020 12/15/2019  Decreased Interest 3 0 0  Down, Depressed, Hopeless 0 0 0  PHQ - 2 Score 3 0 0  Altered sleeping 3 - -  Tired, decreased energy 3 - -  Change in appetite 0 - -  Feeling bad or failure about yourself  0 - -  Trouble concentrating 1 - -  Moving slowly or fidgety/restless 0 - -  Suicidal thoughts 0 - -  PHQ-9 Score 10 - -  Difficult doing work/chores Not difficult at all - -    Reports having issues with hearing loss and he would like a referral to see about hearing aids.   Reports seeing dermatologist for "iItching all over". States he is taking prednisone which helps. States he was told itching may be related to HCTZ. States pruritis started 6 months after staring on HCTZ. He would like to try stopping HCTZ.   Reviewed  allergies, medications, past medical, surgical, family, and social history.    Review of Systems Pertinent positives and negatives in the history of present illness.     Objective:   Physical Exam BP 110/62   Pulse 62   Wt 270 lb (122.5 kg)   SpO2 97%   BMI 37.66 kg/m   Alert and oriented and in no distress. Cardiac exam shows a regular sinus rhythm.  Lungs are clear but expiratory wheezes present in bases. Extremities without edema. Skin is warm and dry. Normal speech, mood and memory.      Assessment & Plan:  Hospital discharge follow-up - Plan: Ambulatory referral to Cardiology -No records available and I will request these. He declines labs today.  Paroxysmal atrial fibrillation ([place]) - Plan: Ambulatory referral to Cardiology -He will continue on Eliquis until he sees cardiology.  Currently asymptomatic.  Referral made to cardiology.  Chest pain, unspecified type - Plan: Ambulatory referral to Cardiology -Currently he is pain-free.  I will request records from [facility] and refer him to cardiology  Abdominal aortic aneurysm (AAA) without rupture ([place]) - Plan: Ambulatory referral to Cardiology -Followed by vascular  Chronic obstructive pulmonary disease, unspecified COPD type ([place]) -Continue seeing pulmonology  Essential hypertension - Plan: Ambulatory referral to Cardiology, lisinopril (ZESTRIL) 20 MG tablet -  Blood pressure controlled.  I am switching his lisinopril/HCTZ to lisinopril only due to recent pruritus to see if this helps. -Follow-up in 4 weeks or sooner if needed  Attention deficit disorder, unspecified hyperactivity presence - Plan: Ambulatory referral to Psychiatry, amphetamine-dextroamphetamine (ADDERALL) 30 MG tablet -Adderall refilled.  He does not feel that he is getting the benefit that he has in the past from this medication.  I am referring him to a psychiatrist and would appreciate their assistance and would like for them to refill this  medication going forward.  Prediabetes -Recommend low sugar, low-carb diet.  He declines labs today.  Schizophrenia, unspecified type ([place]) - Plan: Ambulatory referral to Psychiatry  Auditory hallucinations - Plan: Ambulatory referral to Psychiatry -States he has adjusted well to this.  Denies SI or HI.  Bilateral hearing loss, unspecified hearing loss type - Plan: Ambulatory referral to Audiology  On anticoagulant therapy - Plan: Ambulatory referral to Cardiology  Pruritus -Continue seeing dermatology.  We will stop his HCTZ per their recommendation to see if this helps with pruritus.  He will report back to me. In 4 weeks or sooner if needed  Refuses labs. States he has been stuck so many times in the past few weeks while in the hospital.

## 2021-02-03 NOTE — Patient Instructions (Signed)
You will hear from [facility] to schedule a cardiology appointment.   You will hear from [facility]   You will also hear from a psychiatrist to schedule. Let me know when they contact you.   Stop the lisinopril-HCTZ and start the lisinopril.   Continue on your other medications for now.

## 2021-02-04 ENCOUNTER — Other Ambulatory Visit: Payer: Medicare HMO

## 2021-02-07 ENCOUNTER — Encounter: Payer: Self-pay | Admitting: Family Medicine

## 2021-02-16 ENCOUNTER — Encounter: Payer: Self-pay | Admitting: Family Medicine

## 2021-02-21 DIAGNOSIS — L2084 Intrinsic (allergic) eczema: Secondary | ICD-10-CM | POA: Diagnosis not present

## 2021-02-22 ENCOUNTER — Other Ambulatory Visit: Payer: Self-pay

## 2021-02-22 ENCOUNTER — Telehealth: Payer: Self-pay

## 2021-02-22 ENCOUNTER — Encounter: Payer: Self-pay | Admitting: Family Medicine

## 2021-02-22 ENCOUNTER — Telehealth: Payer: Self-pay | Admitting: Family Medicine

## 2021-02-22 ENCOUNTER — Other Ambulatory Visit: Payer: Self-pay | Admitting: Family Medicine

## 2021-02-22 ENCOUNTER — Other Ambulatory Visit: Payer: Medicare HMO

## 2021-02-22 NOTE — Telephone Encounter (Signed)
Called pt to find out what dose of eliquis and metoprolol is he taking to be fill to cover him until he is seen by Dr. Johnsie Cancel in [place]. Will also send a my chart message. [place]

## 2021-02-22 NOTE — Telephone Encounter (Signed)
Patient returned [name]'s call re meds  Eliquis is 5mg  and Metoprolol tartrate is 50mg 

## 2021-02-22 NOTE — Telephone Encounter (Signed)
On further review pt will see Dr. Johnsie Cancel in [place] .  Please see previous message. [place]

## 2021-02-23 ENCOUNTER — Other Ambulatory Visit: Payer: Self-pay

## 2021-02-23 MED ORDER — METOPROLOL TARTRATE 50 MG PO TABS: 50.0000 mg | ORAL_TABLET | Freq: Every day | ORAL | 0 refills | Status: DC

## 2021-02-23 MED ORDER — LEVOCETIRIZINE DIHYDROCHLORIDE 5 MG PO TABS: 5.0000 mg | ORAL_TABLET | Freq: Every day | ORAL | 0 refills | Status: DC

## 2021-02-23 MED ORDER — ELIQUIS 5 MG PO TABS: 5.0000 mg | ORAL_TABLET | Freq: Every day | ORAL | 0 refills | Status: DC

## 2021-02-23 MED ORDER — HYDROXYZINE HCL 10 MG PO TABS
ORAL_TABLET | ORAL | 0 refills | Status: DC
Start: 2021-02-23 — End: 2021-11-15

## 2021-02-25 ENCOUNTER — Encounter: Payer: Self-pay | Admitting: Family Medicine

## 2021-03-01 ENCOUNTER — Encounter: Payer: Self-pay | Admitting: Internal Medicine

## 2021-03-08 ENCOUNTER — Ambulatory Visit: Payer: Medicare HMO | Admitting: Family Medicine

## 2021-03-10 ENCOUNTER — Ambulatory Visit: Payer: Medicare HMO | Admitting: Family Medicine

## 2021-03-15 ENCOUNTER — Other Ambulatory Visit: Payer: Self-pay | Admitting: Family Medicine

## 2021-03-15 DIAGNOSIS — F988 Other specified behavioral and emotional disorders with onset usually occurring in childhood and adolescence: Secondary | ICD-10-CM

## 2021-03-16 NOTE — Telephone Encounter (Signed)
Looks like Vickie made referral to psychiatry.   What is the status of referral?  May need to call referral dept and ask.    I have never seen him for ADD and with heart rhythm issues, defiantly need specialist involved with medicaiton management  Steven Hayden

## 2021-03-16 NOTE — Telephone Encounter (Signed)
Pt has been sent a message that he needs to call and schedule a med check appointment

## 2021-03-17 MED ORDER — LEVOCETIRIZINE DIHYDROCHLORIDE 5 MG PO TABS: 5.0000 mg | ORAL_TABLET | Freq: Every day | ORAL | 0 refills | Status: DC

## 2021-03-17 NOTE — Telephone Encounter (Signed)
I have sent a message to patient. Please deny adderall

## 2021-03-18 DIAGNOSIS — F17210 Nicotine dependence, cigarettes, uncomplicated: Secondary | ICD-10-CM | POA: Diagnosis not present

## 2021-03-18 DIAGNOSIS — L299 Pruritus, unspecified: Secondary | ICD-10-CM | POA: Diagnosis not present

## 2021-03-18 DIAGNOSIS — L209 Atopic dermatitis, unspecified: Secondary | ICD-10-CM | POA: Diagnosis not present

## 2021-03-23 DIAGNOSIS — L2084 Intrinsic (allergic) eczema: Secondary | ICD-10-CM | POA: Diagnosis not present

## 2021-04-26 ENCOUNTER — Ambulatory Visit (INDEPENDENT_AMBULATORY_CARE_PROVIDER_SITE_OTHER): Payer: Medicare HMO | Admitting: Physician Assistant

## 2021-04-26 ENCOUNTER — Ambulatory Visit (HOSPITAL_COMMUNITY)
Admission: RE | Admit: 2021-04-26 | Discharge: 2021-04-26 | Disposition: A | Discharge status: Home / Self Care | Payer: Medicare HMO | Source: Ambulatory Visit | Attending: Vascular Surgery | Admitting: Vascular Surgery

## 2021-04-26 ENCOUNTER — Other Ambulatory Visit: Payer: Self-pay

## 2021-04-26 VITALS — BP 124/90 | HR 76 | Temp 98.0°F | Resp 20 | Ht 71.0 in | Wt 270.0 lb

## 2021-04-26 DIAGNOSIS — I714 Abdominal aortic aneurysm, without rupture, unspecified: Secondary | ICD-10-CM

## 2021-04-26 NOTE — Progress Notes (Signed)
HISTORY AND PHYSICAL     CC:  follow up. For EVAR Requesting Provider:  Girtha Rm, NP-C  HPI: This is a 70 y.o. male who is here today for follow up for AAA and is s/p EVAR for 10cm AAA on  05/18/2014 by Dr. Bridgett Larsson.  Pt was last seen 04/22/2020 and at that time, He was doing well without back or abdominal pain or claudication or rest pain or non healing wounds.  He was working on quitting smoking at that time.   The pt returns today for follow up studies.  He developed new onset afib earlier this year and is now on Eliquis.    The pt is not on a statin for cholesterol management.    The pt is not on an aspirin.    Other AC:  Eliquis The pt is on BB, ACEI for hypertension.  The pt does not have diabetes. Tobacco hx:  current  Pt does not have family hx of AAA.  Past Medical History:  Diagnosis Date   AAA (abdominal aortic aneurysm) ([place])    ADD (attention deficit disorder)    Atrial fibrillation (HCC)    Bipolar affective disorder (HCC)    COPD (chronic obstructive pulmonary disease) ([place])    Hyperlipidemia    Hypertension    Prediabetes    Hgb A1c 6.4% August 2021   Schizophrenia [facility])    Statin declined 04/16/2020   States he will not take a statin. Has taken them in past.    Tobacco abuse     Past Surgical History:  Procedure Laterality Date   ABDOMINAL AORTIC ANEURYSM REPAIR     ABDOMINAL AORTIC ENDOVASCULAR STENT GRAFT N/A 05/18/2014   Procedure: ABDOMINAL AORTIC ENDOVASCULAR STENT GRAFT;  Surgeon: Conrad [place], MD;  Location: [facility] OR;  Service: Vascular;  Laterality: N/A;   KNEE SURGERY     bilateral    Allergies  Allergen Reactions   Codeine Phosphate     REACTION: swelling of uvula, hives   Dilaudid [Hydromorphone]     Current Outpatient Medications  Medication Sig Dispense Refill   albuterol (VENTOLIN HFA) 108 (90 Base) MCG/ACT inhaler Inhale 2 puffs into the lungs every 6 (six) hours as needed for wheezing or shortness of breath.      amphetamine-dextroamphetamine (ADDERALL) 30 MG tablet Take 1 tablet by mouth daily. 30 tablet 0   apixaban (ELIQUIS) 5 MG TABS tablet Take 1 tablet (5 mg total) by mouth daily. 90 tablet 0   cholecalciferol (VITAMIN D3) 25 MCG (1000 UNIT) tablet 1 tablet     Dermatological Products, Misc. [facility]) lotion apply to body daily for maintenance     diphenhydrAMINE HCl (BENADRYL ALLERGY PO) Take by mouth.     Doxylamine Succinate, Sleep, (UNISOM PO) 2 capsules     DUPIXENT 300 MG/2ML SOPN SMARTSIG:1 pre-filled pen syringe SUB-Q Every 2 Weeks     hydrOXYzine (ATARAX/VISTARIL) 10 MG tablet take 1-2 pills po nightly for itching 90 tablet 0   levocetirizine (XYZAL) 5 MG tablet Take 1 tablet (5 mg total) by mouth daily. 90 tablet 0   lisinopril (ZESTRIL) 20 MG tablet Take 1 tablet (20 mg total) by mouth daily. 90 tablet 1   MAGNESIUM MALATE PO 4050 mg     metoprolol tartrate (LOPRESSOR) 50 MG tablet Take 1 tablet (50 mg total) by mouth daily. 90 tablet 0   No current facility-administered medications for this visit.    Family History  Problem Relation Age of Onset  Hypertension Brother    Hyperlipidemia Brother    Hypertension Father    Hyperlipidemia Father    Cancer Father     Social History   Socioeconomic History   Marital status: Divorced    Spouse name: Not on file   Number of children: 1   Years of education: Not on file   Highest education level: Not on file  Occupational History   Not on file  Tobacco Use   Smoking status: Some Days    Packs/day: 2.00    Years: 50.00    Pack years: 100.00    Types: Cigarettes   Smokeless tobacco: Never   Tobacco comments:    currently smoking 1ppd as of 10/07/20  Substance and Sexual Activity   Alcohol use: Yes    Alcohol/week: 20.0 standard drinks    Types: 20 Shots of liquor per week    Comment: 3 shots every other day   Drug use: No   Sexual activity: Not on file  Other Topics Concern   Not on file  Social History Narrative    Not on file   Social Determinants of Health   Financial Resource Strain: Not on file  Food Insecurity: Not on file  Transportation Needs: Not on file  Physical Activity: Not on file  Stress: Not on file  Social Connections: Not on file  Intimate Partner Violence: Not on file     REVIEW OF SYSTEMS:   '[X]'$  denotes positive finding, '[ ]'$  denotes negative finding Cardiac  Comments:  Chest pain or chest pressure:    Shortness of breath upon exertion:    Short of breath when lying flat:    Irregular heart rhythm:        Vascular    Pain in calf, thigh, or hip brought on by ambulation:    Pain in feet at night that wakes you up from your sleep:     Blood clot in your veins:    Leg swelling:         Pulmonary    Oxygen at home:    Productive cough:     Wheezing:         Neurologic    Sudden weakness in arms or legs:     Sudden numbness in arms or legs:     Sudden onset of difficulty speaking or slurred speech:    Temporary loss of vision in one eye:     Problems with dizziness:         Gastrointestinal    Blood in stool:     Vomited blood:         Genitourinary    Burning when urinating:     Blood in urine:        Psychiatric    Major depression:         Hematologic    Bleeding problems:    Problems with blood clotting too easily:        Skin    Rashes or ulcers:        Constitutional    Fever or chills:      PHYSICAL EXAMINATION:  Today's Vitals   04/26/21 0957  BP: 124/90  Pulse: 76  Resp: 20  Temp: 98 F (36.7 C)  TempSrc: Temporal  SpO2: 96%  Weight: 270 lb (122.5 kg)  Height: '5\' 11"'$  (1.803 m)   Body mass index is 37.66 kg/m.   General:  WDWN in NAD; vital signs documented above Gait: Not observed HENT: WNL,  normocephalic Pulmonary: normal non-labored breathing , without wheezing Cardiac: irregular HR, without  Murmur; without carotid bruits Abdomen: soft, NT, no masses; aortic pulse is not palpable Skin: without rashes Vascular  Exam/Pulses:  Right Left  Radial 2+ (normal) 2+ (normal)  Femoral Difficult to palpate due to body habitus Difficult to palpate due to body habitus  Popliteal Unable to palpate Unable to palpate  DP 2+ (normal) 2+ (normal)  PT Unable to palpate Unable to palpate   Extremities: without ischemic changes, without Gangrene , without cellulitis; without open wounds;  Musculoskeletal: no muscle wasting or atrophy  Neurologic: A&O X 3;  No focal weakness or paresthesias are detected Psychiatric:  The pt has Normal affect.   Non-Invasive Vascular Imaging:   EVAR Arterial duplex on 04/26/2021: Endovascular Aortic Repair (EVAR):  +----------+----------------+-------------------+-------------------+            Diameter AP (cm)Diameter Trans (cm)Velocities (cm/sec)  +----------+----------------+-------------------+-------------------+  Aorta     5.74            5.97               52                   +----------+----------------+-------------------+-------------------+  Right Limb1.58            1.58               59                   +----------+----------------+-------------------+-------------------+  Left Limb 1.59            1.71               51                   +----------+----------------+-------------------+-------------------+   Summary:  Abdominal Aorta: Patent endovascular aneurysm repair with no evidence of  endoleak.   Previous EVAR arterial duplex on 04/22/2020: Endovascular Aortic Repair (EVAR):  +----------+----------------+-------------------+-------------------+            Diameter AP (cm)Diameter Trans (cm)Velocities (cm/sec)  +----------+----------------+-------------------+-------------------+  Aorta     6.63            5.67               108                  +----------+----------------+-------------------+-------------------+  Right Limb2.01            2.06               58                    +----------+----------------+-------------------+-------------------+  Left Limb 1.81            1.96               64                   +----------+----------------+-------------------+-------------------+   ASSESSMENT/PLAN:: 70 y.o. male here with hx of EVAR on 05/18/2014 by Dr. Bridgett Larsson.  -pt doing well without any back or abdominal pain.  His duplex shows aneurysmal sac continues to get smaller without evidence of endoleak.  He does have a daughter that is 42 y/o.   Discussed that in the future she may need to be screened.  She is not a smoker. -he continues to smoke but is still trying to quit.  He is down to 1 ppd from 2ppd.   -  he is on Eliquis for new afib earlier this year.  He is not on asa.  He does not take statin as he states he cannot tolerate them.  -pt will f/u in one year with EVAR duplex.   Leontine Locket, [facility] [phone]  Clinic MD:   Carlis Abbott

## 2021-04-26 NOTE — Progress Notes (Deleted)
CARDIOLOGY CONSULT NOTE       Patient ID: Steven Hayden MRN: [id number] DOB/AGE: 1951/06/09 70 y.o.  Admit date: (Not on file) Referring Physician: Raenette Rover Primary Physician: Girtha Rm, NP-C Primary Cardiologist: New Reason for Consultation: AFib  Active Problems:   * No active hospital problems. *   HPI:  70 y.o. with history of AAA post EVAR 2015 now followed by [facility] COPD, HTN, HLD, Bipolar and current smoking Developed new onset afib earlier this year and is on eliquis now Hospitalized at [facility] May with chest pain and afib. Has not had good psychiatric f/u and has some hallucinations Had been on Adderall as well Intolerant to statins and ? Pruritis with HCTZ   Echo 01/20/21 [place] EF 55-60% trace MR   ***  ROS All other systems reviewed and negative except as noted above  Past Medical History:  Diagnosis Date   AAA (abdominal aortic aneurysm) (HCC)    ADD (attention deficit disorder)    Atrial fibrillation (HCC)    Bipolar affective disorder (HCC)    COPD (chronic obstructive pulmonary disease) ([place])    Hyperlipidemia    Hypertension    Prediabetes    Hgb A1c 6.4% August 2021   Schizophrenia [facility])    Statin declined 04/16/2020   States he will not take a statin. Has taken them in past.    Tobacco abuse     Family History  Problem Relation Age of Onset   Hypertension Brother    Hyperlipidemia Brother    Hypertension Father    Hyperlipidemia Father    Cancer Father     Social History   Socioeconomic History   Marital status: Divorced    Spouse name: Not on file   Number of children: 1   Years of education: Not on file   Highest education level: Not on file  Occupational History   Not on file  Tobacco Use   Smoking status: Some Days    Packs/day: 2.00    Years: 50.00    Pack years: 100.00    Types: Cigarettes   Smokeless tobacco: Never   Tobacco comments:    currently smoking 1ppd as of 10/07/20  Substance and Sexual Activity    Alcohol use: Yes    Alcohol/week: 20.0 standard drinks    Types: 20 Shots of liquor per week    Comment: 3 shots every other day   Drug use: No   Sexual activity: Not on file  Other Topics Concern   Not on file  Social History Narrative   Not on file   Social Determinants of Health   Financial Resource Strain: Not on file  Food Insecurity: Not on file  Transportation Needs: Not on file  Physical Activity: Not on file  Stress: Not on file  Social Connections: Not on file  Intimate Partner Violence: Not on file    Past Surgical History:  Procedure Laterality Date   ABDOMINAL AORTIC ANEURYSM REPAIR     ABDOMINAL AORTIC ENDOVASCULAR STENT GRAFT N/A 05/18/2014   Procedure: ABDOMINAL AORTIC ENDOVASCULAR STENT GRAFT;  Surgeon: Conrad Storla, MD;  Location: [facility];  Service: Vascular;  Laterality: N/A;   KNEE SURGERY     bilateral      Current Outpatient Medications:    albuterol (VENTOLIN HFA) 108 (90 Base) MCG/ACT inhaler, Inhale 2 puffs into the lungs every 6 (six) hours as needed for wheezing or shortness of breath., Disp: , Rfl:    amphetamine-dextroamphetamine (ADDERALL) 30  MG tablet, Take 1 tablet by mouth daily., Disp: 30 tablet, Rfl: 0   apixaban (ELIQUIS) 5 MG TABS tablet, Take 1 tablet (5 mg total) by mouth daily., Disp: 90 tablet, Rfl: 0   cholecalciferol (VITAMIN D3) 25 MCG (1000 UNIT) tablet, 1 tablet, Disp: , Rfl:    Dermatological Products, Misc. (EPICERAM) lotion, apply to body daily for maintenance, Disp: , Rfl:    diphenhydrAMINE HCl (BENADRYL ALLERGY PO), Take by mouth., Disp: , Rfl:    Doxylamine Succinate, Sleep, (UNISOM PO), 2 capsules, Disp: , Rfl:    DUPIXENT 300 MG/2ML SOPN, SMARTSIG:1 pre-filled pen syringe SUB-Q Every 2 Weeks, Disp: , Rfl:    hydrOXYzine (ATARAX/VISTARIL) 10 MG tablet, take 1-2 pills po nightly for itching, Disp: 90 tablet, Rfl: 0   levocetirizine (XYZAL) 5 MG tablet, Take 1 tablet (5 mg total) by mouth daily., Disp: 90 tablet, Rfl: 0    lisinopril (ZESTRIL) 20 MG tablet, Take 1 tablet (20 mg total) by mouth daily., Disp: 90 tablet, Rfl: 1   MAGNESIUM MALATE PO, 4050 mg, Disp: , Rfl:    metoprolol tartrate (LOPRESSOR) 50 MG tablet, Take 1 tablet (50 mg total) by mouth daily., Disp: 90 tablet, Rfl: 0    Physical Exam: There were no vitals taken for this visit.    Affect appropriate Overweight chronically ill  HEENT: normal Neck supple with no adenopathy JVP normal no bruits no thyromegaly Lungs clear with no wheezing and good diaphragmatic motion Heart:  S1/S2 no murmur, no rub, gallop or click PMI normal Abdomen: benighn, BS positve, no tenderness, no AAA no bruit.  No HSM or HJR Distal pulses intact with no bruits No edema Neuro non-focal Skin warm and dry No muscular weakness   Labs:   Lab Results  Component Value Date   WBC 7.7 04/15/2020   HGB 16.6 04/15/2020   HCT 51.0 04/15/2020   MCV 96 04/15/2020   PLT 189 04/15/2020   No results for input(s): NA, K, CL, CO2, BUN, CREATININE, CALCIUM, PROT, BILITOT, ALKPHOS, ALT, AST, GLUCOSE in the last 168 hours.  Invalid input(s): LABALBU Lab Results  Component Value Date   CKTOTAL 84 02/10/2020   CKMB 1.8 02/10/2020    Lab Results  Component Value Date   CHOL 240 (H) 04/15/2020   CHOL 200 08/05/2014   CHOL 225 (H) 06/22/2014   Lab Results  Component Value Date   HDL 39 (L) 04/15/2020   HDL 32.50 (L) 08/05/2014   HDL 30.90 (L) 06/22/2014   Lab Results  Component Value Date   LDLCALC 159 (H) 04/15/2020   LDLCALC 144 (H) 08/05/2014   LDLCALC 174 (H) 06/22/2014   Lab Results  Component Value Date   TRIG 226 (H) 04/15/2020   TRIG 119.0 08/05/2014   TRIG 102.0 06/22/2014   Lab Results  Component Value Date   CHOLHDL 6.2 (H) 04/15/2020   CHOLHDL 6 08/05/2014   CHOLHDL 7 06/22/2014   Lab Results  Component Value Date   LDLDIRECT 191.5 12/12/2011   LDLDIRECT 193.2 06/07/2010   LDLDIRECT 165.4 04/09/2008      Radiology: VAS [place] EVAR  DUPLEX  Result Date: 04/26/2021 Endovascular Aortic Repair Study (EVAR) Patient Name:  BECKET CIANCIULLI  Date of Exam:   04/26/2021 Medical Rec #: [id number]        Accession #:    [id number] Date of Birth: 07/18/1951        Patient Gender: M Patient Age:   83 years Exam [facility] Procedure:      VAS [place] EVAR DUPLEX Referring Phys: Harold Barban --------------------------------------------------------------------------------  Indications: Follow up exam for EVAR. Risk Factors: Hypertension, hyperlipidemia, current smoker. Vascular Interventions: EVAR 05/18/2014. Limitations: Air/bowel gas, obesity, patient discomfort and Dyspnea.  Comparison Study: Prior EVAR study 04/22/2020 showed a maximum residual sac                   diameter of 6.63 cm. Performing Technologist: Delorise Shiner RVT  Examination Guidelines: A complete evaluation includes B-mode imaging, spectral Doppler, color Doppler, and power Doppler as needed of all accessible portions of each vessel. Bilateral testing is considered an integral part of a complete examination. Limited examinations for reoccurring indications may be performed as noted.  Abdominal Aorta Findings: +--------+-------+----------+----------+--------+--------+--------+ LocationAP (cm)Trans (cm)PSV (cm/s)WaveformThrombusComments +--------+-------+----------+----------+--------+--------+--------+ Proximal2.62   2.45      32                                 +--------+-------+----------+----------+--------+--------+--------+ Endovascular Aortic Repair (EVAR): +----------+----------------+-------------------+-------------------+           Diameter AP (cm)Diameter Trans (cm)Velocities (cm/sec) +----------+----------------+-------------------+-------------------+ Aorta     5.74            5.97               52                  +----------+----------------+-------------------+-------------------+ Right Limb1.58            1.58                58                  +----------+----------------+-------------------+-------------------+ Left Limb 1.59            1.71               51                  +----------+----------------+-------------------+-------------------+  Summary: Abdominal Aorta: Patent endovascular aneurysm repair with no evidence of endoleak.  *See table(s) above for measurements and observations.  Electronically signed by Monica Martinez MD on 04/26/2021 at 12:59:36 PM.    Final     EKG: ***   ASSESSMENT AND PLAN:   AAA:  post EVAR with most recent CT showing no endoleak and decreased false lumen size f/u [facility] Afib:  *** COPD/smoking counseled on cessation f/u pulmonary albuterol nebs Has seen NP Wyn Quaker 10/07/20 Gold stage 3 Rx Stiolto Respimat  Bipolar:  f/u psychiatry with some hallucinations    ***   Signed: Jenkins Rouge 04/26/2021, 4:07 PM

## 2021-04-29 ENCOUNTER — Ambulatory Visit: Payer: Medicare HMO | Admitting: Cardiovascular Disease

## 2021-05-17 DIAGNOSIS — L2089 Other atopic dermatitis: Secondary | ICD-10-CM | POA: Diagnosis not present

## 2021-05-17 DIAGNOSIS — L304 Erythema intertrigo: Secondary | ICD-10-CM | POA: Diagnosis not present

## 2021-05-17 DIAGNOSIS — L280 Lichen simplex chronicus: Secondary | ICD-10-CM | POA: Diagnosis not present

## 2021-05-20 ENCOUNTER — Other Ambulatory Visit: Payer: Self-pay | Admitting: Family Medicine

## 2021-06-01 ENCOUNTER — Ambulatory Visit: Payer: Medicare HMO | Admitting: Internal Medicine

## 2021-06-02 NOTE — Progress Notes (Deleted)
Steven Hayden is a 70 y.o. male who presents for annual wellness visit and follow-up on chronic medical conditions.  He has the following concerns:   Immunization History  Administered Date(s) Administered   Influenza Whole 06/07/2010   Influenza,inj,Quad PF,6+ Mos 06/22/2014, 05/29/2016, 06/08/2017, 09/13/2018   Influenza-Unspecified 09/16/2019   PFIZER(Purple Top)SARS-COV-2 Vaccination 11/08/2019, 08/26/2020   Pneumococcal Conjugate-13 06/08/2017   Last colonoscopy: Last PSA: Dentist: Ophtho: Exercise:  Other doctors caring for patient include:   Depression screen:  See questionnaire below.     Depression screen [facility] 2/9 02/03/2021 04/15/2020 12/15/2019  Decreased Interest 3 0 0  Down, Depressed, Hopeless 0 0 0  PHQ - 2 Score 3 0 0  Altered sleeping 3 - -  Tired, decreased energy 3 - -  Change in appetite 0 - -  Feeling bad or failure about yourself  0 - -  Trouble concentrating 1 - -  Moving slowly or fidgety/restless 0 - -  Suicidal thoughts 0 - -  PHQ-9 Score 10 - -  Difficult doing work/chores Not difficult at all - -    Fall Screen: See Questionaire below.   Fall Risk  02/03/2021 06/22/2020 04/15/2020 12/15/2019  Falls in the past year? 0 0 0 0  Number falls in past yr: 0 - 0 0  Injury with Fall? 0 - - 0  Risk for fall due to : No Fall Risks - - -  Follow up Falls evaluation completed - - -    ADL screen:  See questionnaire below.  Functional Status Survey:     End of Life Discussion:  Patient {ACTIONS; HAS/DOES NOT HAVE:19233} a living will and medical power of attorney   Review of Systems  Constitutional: -fever, -chills, -sweats, -unexpected weight change, -anorexia, -fatigue Allergy: -sneezing, -itching, -congestion Dermatology: denies changing moles, rash, lumps, new worrisome lesions ENT: -runny nose, -ear pain, -sore throat, -hoarseness, -sinus pain, -teeth pain, -tinnitus, -hearing loss, -epistaxis Cardiology:  -chest pain, -palpitations, -edema,  -orthopnea, -paroxysmal nocturnal dyspnea Respiratory: -cough, -shortness of breath, -dyspnea on exertion, -wheezing, -hemoptysis Gastroenterology: -abdominal pain, -nausea, -vomiting, -diarrhea, -constipation, -blood in stool, -changes in bowel movement, -dysphagia Hematology: -bleeding or bruising problems Musculoskeletal: -arthralgias, -myalgias, -joint swelling, -back pain, -neck pain, -cramping, -gait changes Ophthalmology: -vision changes, -eye redness, -itching, -discharge Urology: -dysuria, -difficulty urinating, -hematuria, -urinary frequency, -urgency, incontinence Neurology: -headache, -weakness, -tingling, -numbness, -speech abnormality, -memory loss, -falls, -dizziness Psychology:  -depressed mood, -agitation, -sleep problems   PHYSICAL EXAM:  There were no vitals taken for this visit.  General Appearance: Alert, cooperative, no distress, appears stated age Head: Normocephalic, without obvious abnormality, atraumatic Eyes: PERRL, conjunctiva/corneas clear, EOM's intact, fundi benign Ears: Normal TM's and external ear canals Nose: Nares normal, mucosa normal, no drainage or sinus   tenderness Throat: Lips, mucosa, and tongue normal; teeth and gums normal Neck: Supple, no lymphadenopathy, thyroid:no enlargement/tenderness/nodules; no carotid bruit or JVD Back: Spine nontender, no curvature, ROM normal, no CVA tenderness Lungs: Clear to auscultation bilaterally without wheezes, rales or ronchi; respirations unlabored Chest Wall: No tenderness or deformity Heart: Regular rate and rhythm, S1 and S2 normal, no murmur, rub or gallop Breast Exam: No chest wall tenderness, masses or gynecomastia Abdomen: Soft, non-tender, nondistended, normoactive bowel sounds, no masses, no hepatosplenomegaly Genitalia: Normal male external genitalia without lesions.  Testicles without masses.  No inguinal hernias. Rectal: Normal sphincter tone, no masses or tenderness; guaiac negative stool.   Prostate smooth, no nodules, not enlarged. Extremities: No clubbing, cyanosis or edema Pulses:  2+ and symmetric all extremities Skin: Skin color, texture, turgor normal, no rashes or lesions Lymph nodes: Cervical, supraclavicular, and axillary nodes normal Neurologic: CNII-XII intact, normal strength, sensation and gait; reflexes 2+ and symmetric throughout   Psych: Normal mood, affect, hygiene and grooming  ASSESSMENT/PLAN: Medicare annual wellness visit, subsequent  Essential hypertension  Prediabetes  Chronic obstructive pulmonary disease, unspecified COPD type ([place])  Chronic kidney disease, unspecified CKD stage    Discussed PSA screening (risks/benefits), recommended at least 30 minutes of aerobic activity at least 5 days/week; proper sunscreen use reviewed; healthy diet and alcohol recommendations (less than or equal to 2 drinks/day) reviewed; regular seatbelt use; changing batteries in smoke detectors. Immunization recommendations discussed.  Colonoscopy recommendations reviewed.   Medicare Attestation I have personally reviewed: The patient's medical and social history Their use of alcohol, tobacco or illicit drugs Their current medications and supplements The patient's functional ability including ADLs,fall risks, home safety risks, cognitive, and hearing and visual impairment Diet and physical activities Evidence for depression or mood disorders  The patient's weight, height, and BMI have been recorded in the chart.  I have made referrals, counseling, and provided education to the patient based on review of the above and I have provided the patient with a written personalized care plan for preventive services.     [name], NP-C   06/02/2021

## 2021-06-03 ENCOUNTER — Ambulatory Visit: Payer: Medicare HMO | Admitting: Family Medicine

## 2021-06-03 DIAGNOSIS — J449 Chronic obstructive pulmonary disease, unspecified: Secondary | ICD-10-CM

## 2021-06-03 DIAGNOSIS — I10 Essential (primary) hypertension: Secondary | ICD-10-CM

## 2021-06-03 DIAGNOSIS — Z0000 Encounter for general adult medical examination without abnormal findings: Secondary | ICD-10-CM

## 2021-06-03 DIAGNOSIS — R7303 Prediabetes: Secondary | ICD-10-CM

## 2021-06-03 DIAGNOSIS — N189 Chronic kidney disease, unspecified: Secondary | ICD-10-CM

## 2021-06-07 DIAGNOSIS — L2089 Other atopic dermatitis: Secondary | ICD-10-CM | POA: Diagnosis not present

## 2021-06-07 DIAGNOSIS — L304 Erythema intertrigo: Secondary | ICD-10-CM | POA: Diagnosis not present

## 2021-06-07 DIAGNOSIS — L218 Other seborrheic dermatitis: Secondary | ICD-10-CM | POA: Diagnosis not present

## 2021-06-09 ENCOUNTER — Encounter: Payer: Self-pay | Admitting: Family Medicine

## 2021-06-17 DIAGNOSIS — L2089 Other atopic dermatitis: Secondary | ICD-10-CM | POA: Diagnosis not present

## 2021-06-28 DIAGNOSIS — L2089 Other atopic dermatitis: Secondary | ICD-10-CM | POA: Diagnosis not present

## 2021-07-19 ENCOUNTER — Telehealth: Payer: Self-pay

## 2021-07-19 ENCOUNTER — Other Ambulatory Visit: Payer: Self-pay

## 2021-07-19 MED ORDER — APIXABAN 5 MG PO TABS: 5.0000 mg | ORAL_TABLET | Freq: Every day | ORAL | 0 refills | Status: DC

## 2021-07-19 NOTE — Telephone Encounter (Signed)
Received fax from [place] for a refill on Eliqius last apt was 02/03/21. Has not future apt.

## 2021-07-26 DIAGNOSIS — I872 Venous insufficiency (chronic) (peripheral): Secondary | ICD-10-CM | POA: Diagnosis not present

## 2021-07-26 DIAGNOSIS — L308 Other specified dermatitis: Secondary | ICD-10-CM | POA: Diagnosis not present

## 2021-07-26 DIAGNOSIS — L2089 Other atopic dermatitis: Secondary | ICD-10-CM | POA: Diagnosis not present

## 2021-08-09 NOTE — Telephone Encounter (Signed)
Called patient scheduled Med check for 12/9 with Audelia Acton and scheduled medicare wellness in March with Francis Gaines

## 2021-08-11 ENCOUNTER — Emergency Department (HOSPITAL_BASED_OUTPATIENT_CLINIC_OR_DEPARTMENT_OTHER)
Admission: EM | Admit: 2021-08-11 | Discharge: 2021-08-11 | Disposition: A | Discharge status: Home / Self Care | Payer: Medicare HMO | Attending: Emergency Medicine | Admitting: Emergency Medicine

## 2021-08-11 ENCOUNTER — Encounter (HOSPITAL_BASED_OUTPATIENT_CLINIC_OR_DEPARTMENT_OTHER): Payer: Self-pay | Admitting: *Deleted

## 2021-08-11 ENCOUNTER — Other Ambulatory Visit: Payer: Self-pay

## 2021-08-11 DIAGNOSIS — Z79899 Other long term (current) drug therapy: Secondary | ICD-10-CM | POA: Diagnosis not present

## 2021-08-11 DIAGNOSIS — L298 Other pruritus: Secondary | ICD-10-CM | POA: Insufficient documentation

## 2021-08-11 DIAGNOSIS — I129 Hypertensive chronic kidney disease with stage 1 through stage 4 chronic kidney disease, or unspecified chronic kidney disease: Secondary | ICD-10-CM | POA: Insufficient documentation

## 2021-08-11 DIAGNOSIS — J449 Chronic obstructive pulmonary disease, unspecified: Secondary | ICD-10-CM | POA: Insufficient documentation

## 2021-08-11 DIAGNOSIS — F17210 Nicotine dependence, cigarettes, uncomplicated: Secondary | ICD-10-CM | POA: Diagnosis not present

## 2021-08-11 DIAGNOSIS — L299 Pruritus, unspecified: Secondary | ICD-10-CM

## 2021-08-11 DIAGNOSIS — N189 Chronic kidney disease, unspecified: Secondary | ICD-10-CM | POA: Diagnosis not present

## 2021-08-11 DIAGNOSIS — Z7901 Long term (current) use of anticoagulants: Secondary | ICD-10-CM | POA: Insufficient documentation

## 2021-08-11 MED ORDER — PREDNISONE 10 MG PO TABS: 20.0000 mg | ORAL_TABLET | Freq: Every day | ORAL | 0 refills | Status: DC

## 2021-08-11 NOTE — ED Provider Notes (Signed)
[facility] EMERGENCY DEPT Provider Note   CSN: [id number] Arrival date & time: 08/11/21  1305     History Chief Complaint  Patient presents with   Pruritis    Steven Hayden is a 70 y.o. male.  HPI  Patient with history of hypertension, hyperlipidemia, COPD, eczema currently followed by [facility] and dermatology presents due to chronic pruritus.  This is been ongoing for about a year, he is been followed by dermatology get monthly injections which not been improving the pruritus.  So far in the past the only thing that improves the itching is a prednisone taper.  Denies any known aggravating factors, no associated rash.  Denies any history of liver disease.  Denies any known allergies, no recent changes in hygiene products or environmental exposures.  Past Medical History:  Diagnosis Date   AAA (abdominal aortic aneurysm)    ADD (attention deficit disorder)    Atrial fibrillation (HCC)    Bipolar affective disorder (HCC)    COPD (chronic obstructive pulmonary disease) (HCC)    Hyperlipidemia    Hypertension    Prediabetes    Hgb A1c 6.4% August 2021   Schizophrenia [facility])    Statin declined 04/16/2020   States he will not take a statin. Has taken them in past.    Tobacco abuse     Patient Active Problem List   Diagnosis Date Noted   Auditory hallucinations 02/03/2021   Prediabetes    Statin declined 04/16/2020   Obesity (BMI 30-39.9) 04/15/2020   Cigarette smoker motivated to quit 92/42/6834   Umbilical hernia without obstruction and without gangrene 04/15/2020   Chronic kidney disease 04/15/2020   Abnormal findings on diagnostic imaging of lung 02/23/2020   At risk for obstructive sleep apnea 02/23/2020   Schizophrenia ([place])    Hyperlipidemia    AAA (abdominal aortic aneurysm) without rupture 05/17/2014   COPD (chronic obstructive pulmonary disease) ([place]) 09/16/2013   TOBACCO USE 07/14/2008   Attention deficit disorder 05/13/2007   Essential  hypertension 05/13/2007   HYPERLIPIDEMIA 06/15/2006    Past Surgical History:  Procedure Laterality Date   ABDOMINAL AORTIC ANEURYSM REPAIR     ABDOMINAL AORTIC ENDOVASCULAR STENT GRAFT N/A 05/18/2014   Procedure: ABDOMINAL AORTIC ENDOVASCULAR STENT GRAFT;  Surgeon: Conrad Winchester, MD;  Location: [facility] OR;  Service: Vascular;  Laterality: N/A;   KNEE SURGERY     bilateral       Family History  Problem Relation Age of Onset   Hypertension Brother    Hyperlipidemia Brother    Hypertension Father    Hyperlipidemia Father    Cancer Father     Social History   Tobacco Use   Smoking status: Some Days    Packs/day: 2.00    Years: 50.00    Pack years: 100.00    Types: Cigarettes   Smokeless tobacco: Never   Tobacco comments:    currently smoking 1ppd as of 10/07/20  Substance Use Topics   Alcohol use: Yes    Alcohol/week: 20.0 standard drinks    Types: 20 Shots of liquor per week    Comment: 3 shots every other day   Drug use: No    Home Medications Prior to Admission medications   Medication Sig Start Date End Date Taking? Authorizing Provider  albuterol (VENTOLIN HFA) 108 (90 Base) MCG/ACT inhaler Inhale 2 puffs into the lungs every 6 (six) hours as needed for wheezing or shortness of breath.    [provider]  amphetamine-dextroamphetamine (ADDERALL) 30  MG tablet Take 1 tablet by mouth daily. 02/03/21   Henson, Vickie L, PA-C  apixaban (ELIQUIS) 5 MG TABS tablet Take 1 tablet (5 mg total) by mouth daily. 07/19/21   Tysinger, Camelia Eng, PA-C  cholecalciferol (VITAMIN D3) 25 MCG (1000 UNIT) tablet 1 tablet    [provider]  Dermatological Products, Misc. [facility]) lotion apply to body daily for maintenance 11/17/20   [provider]  diphenhydrAMINE HCl (BENADRYL ALLERGY PO) Take by mouth.    [provider]  Doxylamine Succinate, Sleep, (UNISOM PO) 2 capsules    [provider]  DUPIXENT 300 MG/2ML SOPN SMARTSIG:1 pre-filled pen  syringe SUB-Q Every 2 Weeks 12/14/20   [provider]  hydrOXYzine (ATARAX/VISTARIL) 10 MG tablet take 1-2 pills po nightly for itching 02/23/21   Henson, Vickie L, PA-C  levocetirizine (XYZAL) 5 MG tablet Take 1 tablet (5 mg total) by mouth daily. 03/17/21   Henson, Vickie L, PA-C  lisinopril (ZESTRIL) 20 MG tablet Take 1 tablet (20 mg total) by mouth daily. 02/03/21   Henson, Vickie L, PA-C  MAGNESIUM MALATE PO 4050 mg    [provider]  metoprolol tartrate (LOPRESSOR) 50 MG tablet TAKE 1 TABLET (50 MG TOTAL) BY MOUTH DAILY. 05/20/21   Henson, Vickie L, PA-C    Allergies    Codeine phosphate and Dilaudid [hydromorphone]  Review of Systems   Review of Systems  Constitutional:  Negative for fever.  Skin:  Negative for rash.       Pruritis    Physical Exam Updated Vital Signs BP (!) 157/106 (BP Location: Right Arm)   Pulse 72   Temp 98.1 F (36.7 C)   Resp 18   Ht 5\' 11"  (1.803 m)   Wt 116.6 kg   SpO2 94%   BMI 35.84 kg/m   Physical Exam Vitals and nursing note reviewed. Exam conducted with a chaperone present.  Constitutional:      General: He is not in acute distress.    Appearance: Normal appearance.  HENT:     Head: Normocephalic and atraumatic.  Eyes:     General: No scleral icterus.    Extraocular Movements: Extraocular movements intact.     Pupils: Pupils are equal, round, and reactive to light.  Skin:    Coloration: Skin is not jaundiced.     Findings: No lesion.     Comments: Excoriations on the arms bilaterally from itching.  No rash appreciated  Neurological:     Mental Status: He is alert. Mental status is at baseline.     Coordination: Coordination normal.    ED Results / Procedures / Treatments   Labs (all labs ordered are listed, but only abnormal results are displayed) Labs Reviewed - No data to display  EKG None  Radiology No results found.  Procedures Procedures   Medications Ordered in ED Medications - No data to  display  ED Course  I have reviewed the triage vital signs and the nursing notes.  Pertinent labs & imaging results that were available during my care of the patient were reviewed by me and considered in my medical decision making (see chart for details).    MDM Rules/Calculators/A&P                           Hypertensive, vitals are stable and he is nontoxic-appearing.  Doubt any emergent life-threatening etiology of his pruritus.  He is followed by dermatology, has an appointment  next week.  We will give short course of prednisone taper as that has worked in the past and have him follow-up with them additional evaluation and work-up.  Patient discharged in stable condition.  Final Clinical Impression(s) / ED Diagnoses Final diagnoses:  None    Rx / DC Orders ED Discharge Orders     None        Sherrill Raring, [place] 08/11/21 1525    Tegeler, Gwenyth Allegra, MD 08/11/21 1610

## 2021-08-11 NOTE — ED Triage Notes (Signed)
Pt has chronic Pruritis, usually is given Prednisone with short or taper pack, Lower ext red without rash, stated he is itching all over

## 2021-08-11 NOTE — Discharge Instructions (Addendum)
Take 40 mg of Prednisone for 5 days. Take 20 mg in morning and 20 mg at night. Follow up with dermatolgy group next week.

## 2021-08-12 ENCOUNTER — Ambulatory Visit (INDEPENDENT_AMBULATORY_CARE_PROVIDER_SITE_OTHER): Payer: Medicare HMO | Admitting: Medical

## 2021-08-12 VITALS — BP 140/80 | HR 79 | Wt 265.8 lb

## 2021-08-12 DIAGNOSIS — I10 Essential (primary) hypertension: Secondary | ICD-10-CM

## 2021-08-12 DIAGNOSIS — E669 Obesity, unspecified: Secondary | ICD-10-CM | POA: Diagnosis not present

## 2021-08-12 DIAGNOSIS — F988 Other specified behavioral and emotional disorders with onset usually occurring in childhood and adolescence: Secondary | ICD-10-CM | POA: Diagnosis not present

## 2021-08-12 DIAGNOSIS — I714 Abdominal aortic aneurysm, without rupture, unspecified: Secondary | ICD-10-CM | POA: Diagnosis not present

## 2021-08-12 DIAGNOSIS — R7303 Prediabetes: Secondary | ICD-10-CM | POA: Diagnosis not present

## 2021-08-12 DIAGNOSIS — N189 Chronic kidney disease, unspecified: Secondary | ICD-10-CM

## 2021-08-12 DIAGNOSIS — E785 Hyperlipidemia, unspecified: Secondary | ICD-10-CM | POA: Diagnosis not present

## 2021-08-12 DIAGNOSIS — F17200 Nicotine dependence, unspecified, uncomplicated: Secondary | ICD-10-CM

## 2021-08-12 DIAGNOSIS — J449 Chronic obstructive pulmonary disease, unspecified: Secondary | ICD-10-CM

## 2021-08-12 DIAGNOSIS — G4730 Sleep apnea, unspecified: Secondary | ICD-10-CM

## 2021-08-12 DIAGNOSIS — I480 Paroxysmal atrial fibrillation: Secondary | ICD-10-CM

## 2021-08-12 MED ORDER — TRELEGY ELLIPTA 200-62.5-25 MCG/ACT IN AEPB: 1.0000 | INHALATION_SPRAY | Freq: Every day | RESPIRATORY_TRACT | 5 refills | Status: DC

## 2021-08-12 MED ORDER — VITAMIN D 25 MCG (1000 UNIT) PO TABS: ORAL_TABLET | ORAL | 3 refills | Status: DC

## 2021-08-12 MED ORDER — METOPROLOL SUCCINATE ER 25 MG PO TB24: 25.0000 mg | ORAL_TABLET | Freq: Every day | ORAL | 1 refills | Status: DC

## 2021-08-12 MED ORDER — ALBUTEROL SULFATE HFA 108 (90 BASE) MCG/ACT IN AERS: 2.0000 | INHALATION_SPRAY | Freq: Four times a day (QID) | RESPIRATORY_TRACT | 2 refills | Status: DC | PRN

## 2021-08-12 MED ORDER — LISINOPRIL 20 MG PO TABS: 20.0000 mg | ORAL_TABLET | Freq: Every day | ORAL | 1 refills | Status: DC

## 2021-08-12 MED ORDER — ROSUVASTATIN CALCIUM 5 MG PO TABS: 5.0000 mg | ORAL_TABLET | ORAL | 1 refills | Status: DC

## 2021-08-12 MED ORDER — APIXABAN 5 MG PO TABS: 5.0000 mg | ORAL_TABLET | Freq: Two times a day (BID) | ORAL | 1 refills | Status: DC

## 2021-08-12 NOTE — Patient Instructions (Signed)
High blood pressure: Begin back on metoprolol, Toprol-XL 25 mg You should be taking lisinopril 20 mg,  1 tablet daily, not 2 tablets daily Eat a low-salt diet Try to walk for exercise regularly  Coronary artery disease, aortic atherosclerosis You have prior findings of coronary arterial disease and aortic atherosclerosis cholesterol buildup in your arteries.   You should be on cholesterol medicine for this reason to help prevent additional blockage You apparently have not tolerated cholesterol statin medication in the past I recommend a trial of Crestor at low-dose 3 days a week instead of daily.  Let us see if you tolerate this If you do not tolerate this we may have to try different class of medicine such as Praluent or Repatha  Aortic aneurysm The CT scan you had May 2021 showed abnormal finding of your aorta.  He was recommended to have yearly surveillance for this aneurysm finding.  If agreeable we will go ahead and put this order in  History of atrial fibrillation I recommend recheck with cardiology since you have not seen them in a while Your heart rhythm seems to be in check right now Start back on Toprol/metoprolol Continue Eliquis but this should be twice daily dosing to help prevent blood clot  We will try to get a copy of your lab work from dermatology.  They are closed today being Friday  Prediabetes You have a history of prediabetes I recommend an updated hemoglobin A1c blood test You declined blood work today I recommend a low sugar diet and losing weight  COPD Your symptoms are not at goal Instead of taking just Spiriva daily let us try sample called Trelegy This is a 3 in 1 medication powder that you inhale once daily.  This is for daily maintenance of COPD symptoms Rinse your mouth out with water a few minutes after you use this You can continue albuterol rescue inhaler 2 puffs every 4-6 hours as needed for shortness of breath or wheezing  Continue vitamin D  supplement  Continue routine follow-up with dermatology  I sent refills on appropriate medications today  ADD I do not feel comfortable putting you back on Adderall at this time.  Adderall is a stimulant which can put extra strain on the heart.  If your cardiologist feels this is safe then we can restart this but right now this would not be a good idea

## 2021-08-12 NOTE — Progress Notes (Signed)
Subjective:  Steven Hayden is a 70 y.o. male who presents for Chief Complaint  Patient presents with   med check    Med check and hospital follow-up- itching        Here for med check.  Medical team: [facility] Dermatology Cardiology, possibly a Dr. Bridgett Larsson?  He wasn't completely sure Dr. Marcene Duos, ortho Wyn Quaker, NP, pulmonology Leontine Locket PA, vascular surgery Was seeing [name], NP here prior to her leaving recently  This is my first visit with him.   He has a history of high blood pressure, hyperlipidemia, ADD, smoker, COPD, aortic abdominal aneurysm, obesity, CKD, prediabetes, statin intolerance, history of Afib.  He notes that he just had a bunch of blood work at [facility] dermatology within the last 2 weeks.  He is not too excited about doing any blood work today.  He is nonfasting.  Hypertension - checks BPs at home.   110/80 - 120/80s.  No chest pain.  Currently on lisinopril 20mg , 2 tablets daily.  Was on Lisinopril HCT prior but dermatology wondered about itching, so HCT was stopped.  Was started on Metoprolol last summer when he had Afib, but he ran out of this.  Still taking Eliquis daily, not twice daily.  He had A. fib diagnosis back in the summertime.  Has hx/o COPD.  Smokes <ppd now.  Uses Spiriva daily, uses albuterol daily.    Has not tolerated statins in the past, multiple.   Was on Adderall but ran out. Would like refill on this.  No other aggravating or relieving factors.    No other c/o.  Past Medical History:  Diagnosis Date   AAA (abdominal aortic aneurysm)    ADD (attention deficit disorder)    Atrial fibrillation (HCC)    Bipolar affective disorder (HCC)    COPD (chronic obstructive pulmonary disease) (HCC)    Hyperlipidemia    Hypertension    Prediabetes    Hgb A1c 6.4% August 2021   Schizophrenia [facility])    Statin declined 04/16/2020   States he will not take a statin. Has taken them in past.    Tobacco abuse    Current  Outpatient Medications on File Prior to Visit  Medication Sig Dispense Refill   amphetamine-dextroamphetamine (ADDERALL) 30 MG tablet Take 1 tablet by mouth daily. 30 tablet 0   Dermatological Products, Misc. [facility]) lotion apply to body daily for maintenance     diphenhydrAMINE HCl (BENADRYL ALLERGY PO) Take by mouth.     Doxylamine Succinate, Sleep, (UNISOM PO) 2 capsules     DUPIXENT 300 MG/2ML SOPN SMARTSIG:1 pre-filled pen syringe SUB-Q Every 2 Weeks     hydrOXYzine (ATARAX/VISTARIL) 10 MG tablet take 1-2 pills po nightly for itching 90 tablet 0   MAGNESIUM MALATE PO 4050 mg     predniSONE (DELTASONE) 10 MG tablet Take 2 tablets (20 mg total) by mouth daily. 20 tablet 0   Turmeric (QC TUMERIC COMPLEX PO) Take by mouth.     Zinc 50 MG CAPS Take by mouth.     No current facility-administered medications on file prior to visit.     The following portions of the patient's history were reviewed and updated as appropriate: allergies, current medications, past family history, past medical history, past social history, past surgical history and problem list.  ROS Otherwise as in subjective above  Objective: BP 140/80   Pulse 79   Wt 265 lb 12.8 oz (120.6 kg)   SpO2 90%   BMI  37.07 kg/m   BP Readings from Last 3 Encounters:  08/12/21 140/80  08/11/21 (!) 148/100  04/26/21 124/90   General appearance: alert, no distress, well developed, well nourished, white male, morbidly obese Neck: supple, no lymphadenopathy, no thyromegaly, no masses, no JVD Heart: seems to be in rhythm, RRR, normal S1, S2, no murmurs Lungs: decreased throughout, no wheezes, rhonchi, or rales Abdomen: +bs, soft, non tender, non distended, no masses, no hepatomegaly, no splenomegaly Pulses: 2+ radial pulses, 2+ pedal pulses, normal cap refill Ext: no edema   Assessment: Encounter Diagnoses  Name Primary?   Chronic obstructive pulmonary disease, unspecified COPD type ([place]) Yes   Essential hypertension     Hyperlipidemia, unspecified hyperlipidemia type    Obesity (BMI 30-39.9)    Prediabetes    TOBACCO USE    Abdominal aortic aneurysm (AAA) without rupture, unspecified part    Attention deficit disorder, unspecified hyperactivity presence    Chronic kidney disease, unspecified CKD stage    Mild sleep apnea    Paroxysmal atrial fibrillation ([place])      Plan: High blood pressure: Begin back on metoprolol, Toprol-XL 25 mg You should be taking lisinopril 20 mg,  1 tablet daily, not 2 tablets daily Eat a low-salt diet Try to walk for exercise regularly  Coronary artery disease, aortic atherosclerosis You have prior findings of coronary arterial disease and aortic atherosclerosis cholesterol buildup in your arteries.   You should be on cholesterol medicine for this reason to help prevent additional blockage You apparently have not tolerated cholesterol statin medication in the past I recommend a trial of Crestor at low-dose 3 days a week instead of daily.  Let us see if you tolerate this If you do not tolerate this we may have to try different class of medicine such as Praluent or Repatha  Aortic aneurysm The CT scan you had May 2021 showed abnormal finding of your aorta.  He was recommended to have yearly surveillance for this aneurysm finding.  If agreeable we will go ahead and put this order in S/p prior AAA repair, I reviewed vascula notes from 04/2021 and ultrasound at that time.  History of atrial fibrillation I recommend recheck with cardiology since you have not seen them in a while Your heart rhythm seems to be in check right now Start back on Toprol/metoprolol Continue Eliquis but this should be twice daily dosing to help prevent blood clot  We will try to get a copy of your lab work from dermatology.  They are closed today being Friday  Prediabetes You have a history of prediabetes I recommend an updated hemoglobin A1c blood test You declined blood work today I  recommend a low sugar diet and losing weight  COPD Your symptoms are not at goal Instead of taking just Spiriva daily let us try sample called Trelegy This is a 3 in 1 medication powder that you inhale once daily.  This is for daily maintenance of COPD symptoms Rinse your mouth out with water a few minutes after you use this You can continue albuterol rescue inhaler 2 puffs every 4-6 hours as needed for shortness of breath or wheezing  Continue vitamin D supplement  Continue routine follow-up with dermatology  I sent refills on appropriate medications today  ADD I do not feel comfortable putting you back on Adderall at this time.  Adderall is a stimulant which can put extra strain on the heart.  If your cardiologist feels this is safe then we can restart this  but right now this would not be a good idea   Kashus was seen today for med check.  Diagnoses and all orders for this visit:  Chronic obstructive pulmonary disease, unspecified COPD type ([place])  Essential hypertension -     lisinopril (ZESTRIL) 20 MG tablet; Take 1 tablet (20 mg total) by mouth daily.  Hyperlipidemia, unspecified hyperlipidemia type  Obesity (BMI 30-39.9)  Prediabetes  TOBACCO USE  Abdominal aortic aneurysm (AAA) without rupture, unspecified part  Attention deficit disorder, unspecified hyperactivity presence  Chronic kidney disease, unspecified CKD stage  Mild sleep apnea  Paroxysmal atrial fibrillation (HCC)  Other orders -     cholecalciferol (VITAMIN D3) 25 MCG (1000 UNIT) tablet; 1 tablet -     apixaban (ELIQUIS) 5 MG TABS tablet; Take 1 tablet (5 mg total) by mouth 2 (two) times daily. -     albuterol (VENTOLIN HFA) 108 (90 Base) MCG/ACT inhaler; Inhale 2 puffs into the lungs every 6 (six) hours as needed for wheezing or shortness of breath. -     Fluticasone-Umeclidin-Vilant (TRELEGY ELLIPTA) 200-62.5-25 MCG/ACT AEPB; Inhale 1 puff into the lungs daily. -     rosuvastatin (CRESTOR) 5 MG  tablet; Take 1 tablet (5 mg total) by mouth every other day. -     metoprolol succinate (TOPROL XL) 25 MG 24 hr tablet; Take 1 tablet (25 mg total) by mouth daily.   Follow up: 6 weeks fasting

## 2021-08-17 ENCOUNTER — Other Ambulatory Visit: Payer: Self-pay | Admitting: Medical

## 2021-08-17 DIAGNOSIS — I716 Thoracoabdominal aortic aneurysm, without rupture, unspecified: Secondary | ICD-10-CM

## 2021-08-17 MED ORDER — VITAMIN D 25 MCG (1000 UNIT) PO TABS: 1000.0000 [IU] | ORAL_TABLET | Freq: Every day | ORAL | 3 refills | Status: DC

## 2021-08-19 ENCOUNTER — Other Ambulatory Visit: Payer: Self-pay | Admitting: Medical

## 2021-09-07 ENCOUNTER — Ambulatory Visit: Payer: Medicare HMO | Admitting: Family Medicine

## 2021-09-08 ENCOUNTER — Telehealth: Payer: Self-pay | Admitting: Medical

## 2021-09-08 ENCOUNTER — Other Ambulatory Visit: Payer: Self-pay | Admitting: *Deleted

## 2021-09-08 NOTE — Telephone Encounter (Signed)
Please see the fax I received from thoracic surgery office.  They are wanting the CTA before they will see him.  However we cannot seem to get insurance to agree to pay for the CTA.  I am not sure what to do

## 2021-09-08 NOTE — Telephone Encounter (Signed)
Steven Hayden,  Can you try to contact pt's insurance to find out about the CTA. I spoke with Jenny Reichmann at [facility] and she said that patient never had it done in which it was approved back in June. But since it wasn't done there should be able to extend it.

## 2021-09-08 NOTE — Telephone Encounter (Signed)
I have sent a reply by to [facility] and asked them what we are suppose to do. Waiting for a reply

## 2021-09-08 NOTE — Telephone Encounter (Signed)
Steven Hayden,   Patient is scheduled for CTA

## 2021-09-08 NOTE — Progress Notes (Unsigned)
Ct ch 

## 2021-09-15 ENCOUNTER — Ambulatory Visit (HOSPITAL_COMMUNITY)
Admission: RE | Admit: 2021-09-15 | Discharge: 2021-09-15 | Disposition: A | Discharge status: Home / Self Care | Payer: Medicare HMO | Source: Ambulatory Visit | Attending: Medical | Admitting: Medical

## 2021-09-15 ENCOUNTER — Encounter (HOSPITAL_COMMUNITY): Payer: Self-pay

## 2021-09-15 ENCOUNTER — Other Ambulatory Visit: Payer: Self-pay

## 2021-09-15 DIAGNOSIS — I7121 Aneurysm of the ascending aorta, without rupture: Secondary | ICD-10-CM | POA: Diagnosis not present

## 2021-09-15 DIAGNOSIS — I716 Thoracoabdominal aortic aneurysm, without rupture, unspecified: Secondary | ICD-10-CM | POA: Diagnosis not present

## 2021-09-15 LAB — POCT I-STAT CREATININE: Creatinine, Ser: 1.2 mg/dL (ref 0.61–1.24)

## 2021-09-15 MED ORDER — SODIUM CHLORIDE (PF) 0.9 % IJ SOLN
INTRAMUSCULAR | Status: AC
  Filled 2021-09-15: qty 50

## 2021-09-15 MED ORDER — IOHEXOL 350 MG/ML SOLN
100.0000 mL | Freq: Once | INTRAVENOUS | Status: AC | PRN
  Administered 2021-09-15: 100 mL via INTRAVENOUS

## 2021-09-19 ENCOUNTER — Other Ambulatory Visit: Payer: Self-pay

## 2021-09-19 ENCOUNTER — Institutional Professional Consult (permissible substitution) (INDEPENDENT_AMBULATORY_CARE_PROVIDER_SITE_OTHER): Payer: Medicare HMO | Admitting: Physician Assistant

## 2021-09-19 VITALS — BP 126/84 | HR 76 | Resp 20 | Ht 71.0 in | Wt 264.0 lb

## 2021-09-19 DIAGNOSIS — F209 Schizophrenia, unspecified: Secondary | ICD-10-CM | POA: Diagnosis not present

## 2021-09-19 DIAGNOSIS — I7121 Aneurysm of the ascending aorta, without rupture: Secondary | ICD-10-CM | POA: Diagnosis not present

## 2021-09-19 DIAGNOSIS — F319 Bipolar disorder, unspecified: Secondary | ICD-10-CM | POA: Diagnosis not present

## 2021-09-19 DIAGNOSIS — R918 Other nonspecific abnormal finding of lung field: Secondary | ICD-10-CM

## 2021-09-19 DIAGNOSIS — I712 Thoracic aortic aneurysm, without rupture, unspecified: Secondary | ICD-10-CM | POA: Diagnosis not present

## 2021-09-19 NOTE — Progress Notes (Signed)
[address]       [place] [address]             [phone]    Steven Hayden [id number] Nov 11, 1950   History of Present Illness:  Steven Hayden is a 71 yo male with history of COPD, HTN, TAA 10 cm S/P EVAR by [place] in 2015, HLD,  Atrial Fibrillation, CKD, Prediabetes, Nicotine abuse and obesity.  The patient is unsure why he was referred.  He was last seen by Steven Ogle PA-C for a medication review.  He noted the patient had an ascending aneurysm which had not been rescanned since 2021.  He subsequently referred patient for surveillance of follow up.  The patient states that his only problem is itching.  He states it is terrible and he cant get much relief.  He states that steroids use to help but they are no longer working.  He states 2 shots of liquor takes the edge off.  He continues to smoke about 1 pack of cigarettes per day.  He denies chest and or back pain, weight loss, night sweats, chills, hemoptysis.  He does complain of shortness of breath which he states is stable and had improved with adjustment of his inhalers.  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  Current Outpatient Medications on File Prior to Visit  Medication Sig Dispense Refill   albuterol (VENTOLIN HFA) 108 (90 Base) MCG/ACT inhaler Inhale 2 puffs into the lungs every 6 (six) hours as needed for wheezing or shortness of breath. 18 g 2   amphetamine-dextroamphetamine (ADDERALL) 30 MG tablet Take 1 tablet by mouth daily. 30 tablet 0   apixaban (ELIQUIS) 5 MG TABS tablet Take 1 tablet (5 mg total) by mouth 2 (two) times daily. 180 tablet 1   cholecalciferol (VITAMIN D3) 25 MCG (1000 UNIT) tablet Take 1 tablet (1,000 Units total) by mouth daily. 1 tablet 90 tablet 3   Dermatological Products, Misc. [facility]) lotion apply to body daily for maintenance     diphenhydrAMINE HCl (BENADRYL ALLERGY PO) Take by mouth.     Doxylamine  Succinate, Sleep, (UNISOM PO) 2 capsules     DUPIXENT 300 MG/2ML SOPN SMARTSIG:1 pre-filled pen syringe SUB-Q Every 2 Weeks     Fluticasone-Umeclidin-Vilant (TRELEGY ELLIPTA) 200-62.5-25 MCG/ACT AEPB Inhale 1 puff into the lungs daily. 28 each 5   hydrOXYzine (ATARAX/VISTARIL) 10 MG tablet take 1-2 pills po nightly for itching 90 tablet 0   lisinopril (ZESTRIL) 20 MG tablet Take 1 tablet (20 mg total) by mouth daily. 90 tablet 1   MAGNESIUM MALATE PO 4050 mg     metoprolol succinate (TOPROL XL) 25 MG 24 hr tablet Take 1 tablet (25 mg total) by mouth daily. 90 tablet 1   predniSONE (DELTASONE) 10 MG tablet Take 2 tablets (20 mg total) by mouth daily. 20 tablet 0   rosuvastatin (CRESTOR) 5 MG tablet Take 1 tablet (5 mg total) by mouth every other day. 90 tablet 1   Turmeric (QC TUMERIC COMPLEX PO) Take by mouth.     Zinc 50 MG CAPS Take by mouth.     No current facility-administered medications on file prior to visit.     BP 126/84 (BP Location: Right Arm, Patient Position: Sitting)    Pulse 76    Resp 20    Ht 5\' 11"  (1.803 m)    Wt 264 lb (119.7 kg)  SpO2 91% Comment: RA   BMI 36.82 kg/m   Physical Exam  BP 126/84 (BP Location: Right Arm, Patient Position: Sitting)    Pulse 76    Resp 20    Ht 5\' 11"  (1.803 m)    Wt 264 lb (119.7 kg)    SpO2 91% Comment: RA   BMI 36.82 kg/m   Gen: obese, no apparent distress Neck: no carotid bruit Heart: RRR Lungs: CTA bilaterally Abd: soft non-tender, non distended Ext: no edema present, some vascular changes Neuro: grossly intact  CTA Results:  1. Stable appearance of ascending thoracic aortic aneurysm which appears to measure approximately 4.5 cm, previously 4.4 cm, although accurate measurements on today's exam are somewhat affected by motion artifact. Ascending thoracic aortic aneurysm. Recommend semi-annual imaging followup by CTA or MRA and referral to cardiothoracic surgery if not already obtained. This recommendation follows 2010  ACCF/AHA/AATS/ACR/ASA/SCA/SCAI/SIR/STS/SVM Guidelines for the Diagnosis and Management of Patients With Thoracic Aortic Disease. Circulation. 2010; 121: K876-O115. Aortic aneurysm NOS (ICD10-I71.9) 2. There is a new 1.2 cm ground-glass opacity in the right upper lobe. Attention on follow-up CTA chest as recommended above in 6 months.    A/P:  Patient referred for surveillance of Ascending Aortic Aneurysm.  This measured at 4.5 cm which is increase minimally from his previous scan.  His Echocardiogram from 2020 was reviewed and shows a structurally normal aortic valve.  He was counseled on the importance of blood pressure control which was present today.  He was educated on the importance of smoking cessation and provided handouts with tips for quitting.  He is already on a statin which he is tolerating w/o difficulty.  Finally he was educated on avoidance of Fluroquinolones  2. Secondly on his CTA he was noted to have a 1.2 cm ground glass opacity in his right upper lobe which is new from his previous CTA of the chest. Due to the size of the lesion and patients long term smoking and occupational hazards it was felt workup should be initiated on this instead of of 6 month repeat CT scan.  This was discussed and decided with [place].  We will refer the patient to [place] to initiate additional workup  3. RTC in 6 months with repeat CTA Chest  4. RTC sooner if he requires surgical evaluation for RUL nodule   Risk Modification:  Statin:  Yes  Smoking cessation instruction/counseling given:  counseled patient on the dangers of tobacco use, advised patient to stop smoking, and reviewed strategies to maximize success  Patient was counseled on importance of Blood Pressure Control.  Despite Medical intervention if the patient notices persistently elevated blood pressure readings.  They are instructed to contact their Primary Care Physician  Please avoid use of Fluoroquinolones as this can  potentially increase your risk of Aortic Rupture and/or Dissection  Patient educated on signs and symptoms of Aortic Dissection, handout also provided in AVS  Doroteo Nickolson, PA-C 09/19/21

## 2021-09-19 NOTE — Patient Instructions (Signed)
Patient is counseled regarding the importance of long term risk factor modification as they pertain to the presence of ischemic heart disease including avoiding the use of all tobacco products, dietary modifications and medical therapy for diabetes, cholesterol and lipid management, and regular exercise.    Stop smoking immediately and permanently.  AVOID FLUOROQUINLONE ( CIPRO)

## 2021-10-04 ENCOUNTER — Telehealth: Payer: Self-pay | Admitting: Pulmonary Disease

## 2021-10-04 ENCOUNTER — Telehealth: Payer: Self-pay | Admitting: *Deleted

## 2021-10-04 NOTE — Telephone Encounter (Signed)
Spoke to patient. Patient stated that he has recent CT that showed lung nodule.  He would like a dx.  Dr. Ander Slade, please advise. Thanks

## 2021-10-04 NOTE — Telephone Encounter (Signed)
left pt message to see if he had heard from referral for setting up appt w/dr icard office, referral put in 1/24 per Dr Prescott Gum

## 2021-10-05 NOTE — Telephone Encounter (Signed)
ATC patient to go over message with patient, [facility]

## 2021-10-05 NOTE — Telephone Encounter (Signed)
Nodule was not present on a CT that was done in 2021  We can schedule the patient to see either Dr. Lamonte Sakai or Dr. Valeta Harms for an evaluation for bronchoscopy-the nodule is too small for a routine bronchoscopy-these two physicians are the ones that perform the specialized bronchoscopies  I think you will be most appropriate to repeat the CT scan in 3 months to see if nodule is still there or if there are any changes to the nodule

## 2021-10-07 ENCOUNTER — Telehealth: Payer: Self-pay | Admitting: Pulmonary Disease

## 2021-10-07 DIAGNOSIS — L309 Dermatitis, unspecified: Secondary | ICD-10-CM | POA: Diagnosis not present

## 2021-10-07 NOTE — Telephone Encounter (Signed)
Please see 10/04/2021 phone note.   Appt scheduled with Dr. Valeta Harms 10/13/2021 at 4:00. Patient is aware and voiced his understanding.  Nothing further needed.

## 2021-10-07 NOTE — Telephone Encounter (Signed)
Lm x2 for patient Will close encounter per office protocol. Letter mailed to address on file.    

## 2021-10-12 DIAGNOSIS — L2089 Other atopic dermatitis: Secondary | ICD-10-CM | POA: Diagnosis not present

## 2021-10-13 ENCOUNTER — Ambulatory Visit (INDEPENDENT_AMBULATORY_CARE_PROVIDER_SITE_OTHER): Payer: Medicare HMO | Admitting: Pulmonary Disease

## 2021-10-13 ENCOUNTER — Other Ambulatory Visit: Payer: Self-pay

## 2021-10-13 ENCOUNTER — Encounter: Payer: Self-pay | Admitting: Pulmonary Disease

## 2021-10-13 VITALS — BP 134/90 | HR 62 | Temp 97.5°F | Ht 72.0 in | Wt 274.4 lb

## 2021-10-13 DIAGNOSIS — R918 Other nonspecific abnormal finding of lung field: Secondary | ICD-10-CM

## 2021-10-13 DIAGNOSIS — J449 Chronic obstructive pulmonary disease, unspecified: Secondary | ICD-10-CM

## 2021-10-13 NOTE — Progress Notes (Signed)
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  Also found to have a new 1.2 cm groundglass opacity within the right upper lobe.  Patient still is a occasional smoker at his max was 2 packs/day for 50+ years.  He also has regular alcohol use.  Patient had a CT scan completed and then was referred to discuss with [place] next steps regarding small groundglass nodule.  Dr. Ander Slade is currently out of the office.  We will plan to follow-up regarding his nodule needs. Patient is breathing better on trelegy .  Unfortunately he is still smoking.  Approximately 13 cigarettes a day.  Down from 2 packs a day.   Past Medical History:  Diagnosis Date   AAA (abdominal aortic aneurysm)    ADD (attention deficit disorder)    Atrial fibrillation (HCC)    Bipolar affective disorder (HCC)    COPD (chronic obstructive pulmonary disease) (HCC)    Hyperlipidemia    Hypertension    Prediabetes    Hgb A1c 6.4% August 2021   Schizophrenia [facility])    Statin declined 04/16/2020   States he will not take a statin. Has taken them in past.    Tobacco abuse      Family History  Problem Relation Age of Onset   Hypertension Brother    Hyperlipidemia Brother    Hypertension Father    Hyperlipidemia Father    Cancer Father      Past Surgical History:  Procedure Laterality Date   ABDOMINAL AORTIC ANEURYSM REPAIR     ABDOMINAL AORTIC ENDOVASCULAR STENT GRAFT N/A 05/18/2014   Procedure: ABDOMINAL AORTIC ENDOVASCULAR STENT GRAFT;  Surgeon: Conrad [place], MD;  Location: [facility] OR;   Service: Vascular;  Laterality: N/A;   KNEE SURGERY     bilateral    Social History   Socioeconomic History   Marital status: Divorced    Spouse name: Not on file   Number of children: 1   Years of education: Not on file   Highest education level: Not on file  Occupational History   Not on file  Tobacco Use   Smoking status: Some Days    Packs/day: 2.00    Years: 50.00    Pack years: 100.00    Types: Cigarettes   Smokeless tobacco: Never   Tobacco comments:    currently smoking 1ppd as of 10/07/20  Substance and Sexual Activity   Alcohol use: Yes    Alcohol/week: 20.0 standard drinks    Types: 20 Shots of liquor per week    Comment: 3 shots every other day   Drug use: No   Sexual activity: Not on file  Other Topics Concern   Not on file  Social History Narrative   Not on file   Social Determinants of Health   Financial Resource Strain: Not on file  Food Insecurity: Not on file  Transportation Needs: Not on file  Physical Activity: Not on file  Stress: Not on file  Social Connections: Not on file  Intimate Partner Violence: Not on file  Allergies  Allergen Reactions   Codeine Phosphate     REACTION: swelling of uvula, hives   Dilaudid [Hydromorphone]      Outpatient Medications Prior to Visit  Medication Sig Dispense Refill   albuterol (VENTOLIN HFA) 108 (90 Base) MCG/ACT inhaler Inhale 2 puffs into the lungs every 6 (six) hours as needed for wheezing or shortness of breath. 18 g 2   amphetamine-dextroamphetamine (ADDERALL) 30 MG tablet Take 1 tablet by mouth daily. 30 tablet 0   apixaban (ELIQUIS) 5 MG TABS tablet Take 1 tablet (5 mg total) by mouth 2 (two) times daily. 180 tablet 1   cholecalciferol (VITAMIN D3) 25 MCG (1000 UNIT) tablet Take 1 tablet (1,000 Units total) by mouth daily. 1 tablet 90 tablet 3   Dermatological Products, Misc. [facility]) lotion apply to body daily for maintenance     diphenhydrAMINE HCl (BENADRYL ALLERGY PO) Take by mouth.      Doxylamine Succinate, Sleep, (UNISOM PO) 2 capsules     DUPIXENT 300 MG/2ML SOPN SMARTSIG:1 pre-filled pen syringe SUB-Q Every 2 Weeks     Fluticasone-Umeclidin-Vilant (TRELEGY ELLIPTA) 200-62.5-25 MCG/ACT AEPB Inhale 1 puff into the lungs daily. 28 each 5   hydrOXYzine (ATARAX/VISTARIL) 10 MG tablet take 1-2 pills po nightly for itching 90 tablet 0   lisinopril (ZESTRIL) 20 MG tablet Take 1 tablet (20 mg total) by mouth daily. 90 tablet 1   MAGNESIUM MALATE PO 4050 mg     metoprolol succinate (TOPROL XL) 25 MG 24 hr tablet Take 1 tablet (25 mg total) by mouth daily. 90 tablet 1   rosuvastatin (CRESTOR) 5 MG tablet Take 1 tablet (5 mg total) by mouth every other day. 90 tablet 1   Turmeric (QC TUMERIC COMPLEX PO) Take by mouth.     Zinc 50 MG CAPS Take by mouth.     predniSONE (DELTASONE) 10 MG tablet Take 2 tablets (20 mg total) by mouth daily. (Patient not taking: Reported on 10/13/2021) 20 tablet 0   No facility-administered medications prior to visit.    Review of Systems  Constitutional:  Negative for chills, fever, malaise/fatigue and weight loss.  HENT:  Negative for hearing loss, sore throat and tinnitus.   Eyes:  Negative for blurred vision and double vision.  Respiratory:  Positive for cough and shortness of breath. Negative for hemoptysis, sputum production, wheezing and stridor.   Cardiovascular:  Negative for chest pain, palpitations, orthopnea, leg swelling and PND.  Gastrointestinal:  Negative for abdominal pain, constipation, diarrhea, heartburn, nausea and vomiting.  Genitourinary:  Negative for dysuria, hematuria and urgency.  Musculoskeletal:  Negative for joint pain and myalgias.  Skin:  Negative for itching and rash.  Neurological:  Negative for dizziness, tingling, weakness and headaches.  Endo/Heme/Allergies:  Negative for environmental allergies. Does not bruise/bleed easily.  Psychiatric/Behavioral:  Negative for depression. The patient is not nervous/anxious and  does not have insomnia.   All other systems reviewed and are negative.   Objective:  Physical Exam Vitals reviewed.  Constitutional:      General: He is not in acute distress.    Appearance: He is well-developed. He is obese.  HENT:     Head: Normocephalic and atraumatic.  Eyes:     General: No scleral icterus.    Pupils: Pupils are equal, round, and reactive to light.  Neck:     Vascular: No JVD.     Trachea: No tracheal deviation.  Cardiovascular:     Rate and Rhythm: Normal rate and regular rhythm.  Heart sounds: Normal heart sounds. No murmur heard. Pulmonary:     Effort: Pulmonary effort is normal. No tachypnea, accessory muscle usage or respiratory distress.     Breath sounds: No stridor. No wheezing, rhonchi or rales.  Abdominal:     General: There is no distension.     Palpations: Abdomen is soft.     Tenderness: There is no abdominal tenderness.  Musculoskeletal:        General: No tenderness.     Cervical back: Neck supple.  Lymphadenopathy:     Cervical: No cervical adenopathy.  Skin:    General: Skin is warm and dry.     Capillary Refill: Capillary refill takes less than 2 seconds.     Findings: No rash.  Neurological:     Mental Status: He is alert and oriented to person, place, and time.  Psychiatric:        Behavior: Behavior normal.     Vitals:   10/13/21 1612  BP: 134/90  Pulse: 62  Temp: (!) 97.5 F (36.4 C)  TempSrc: Oral  SpO2: 93%  Weight: 274 lb 6.4 oz (124.5 kg)  Height: 6' (1.829 m)   93% on RA BMI Readings from Last 3 Encounters:  10/13/21 37.22 kg/m  09/19/21 36.82 kg/m  08/12/21 37.07 kg/m   Wt Readings from Last 3 Encounters:  10/13/21 274 lb 6.4 oz (124.5 kg)  09/19/21 264 lb (119.7 kg)  08/12/21 265 lb 12.8 oz (120.6 kg)     CBC    Component Value Date/Time   WBC 7.7 04/15/2020 1028   WBC 8.6 05/13/2018 1130   RBC 5.33 04/15/2020 1028   RBC 5.03 05/13/2018 1130   HGB 16.6 04/15/2020 1028   HCT 51.0  04/15/2020 1028   PLT 189 04/15/2020 1028   MCV 96 04/15/2020 1028   MCH 31.1 04/15/2020 1028   MCH 32.4 05/13/2018 1130   MCHC 32.5 04/15/2020 1028   MCHC 33.1 05/13/2018 1130   RDW 13.7 04/15/2020 1028   LYMPHSABS 3.1 04/15/2020 1028   EOSABS 0.1 04/15/2020 1028   BASOSABS 0.0 04/15/2020 1028     Chest Imaging: January 2023 CT chest: New 1.2 cm groundglass opacity in the right upper lobe. The patient's images have been independently reviewed by me.    Pulmonary Functions Testing Results: PFT Results Latest Ref Rng & Units 02/20/2020  FVC-Pre L 2.30  FVC-Predicted Pre % 58  FVC-Post L 2.23  FVC-Predicted Post % 56  Pre FEV1/FVC % % 57  Post FEV1/FCV % % 56  FEV1-Pre L 1.32  FEV1-Predicted Pre % 45  FEV1-Post L 1.24  DLCO uncorrected ml/min/mmHg 18.16  DLCO UNC% % 77  DLCO corrected ml/min/mmHg 18.16  DLCO COR %Predicted % 77  DLVA Predicted % 112  TLC L 5.29  TLC % Predicted % 83  RV % Predicted % 139    FeNO:   Pathology:   Echocardiogram:   Heart Catheterization:     Assessment & Plan:     ICD-10-CM   1. Ground glass opacity present on imaging of lung  R91.8 CT Super D Chest Wo Contrast    2. Abnormal findings on diagnostic imaging of lung  R91.8     3. Chronic obstructive pulmonary disease, unspecified COPD type ([place])  J44.9       Discussion:  This is a 71 year old gentleman, groundglass opacity 1.2 cm in the right upper lobe.  He is a longtime tobacco abuse, cigarette use.  Still smoking 13 cigarettes a  day down from 2 packs/day.  He is smoked for many years and is high risk for the development of lung cancer.  Plan: Most groundglass opacities within the chest statistically resolve on their own. This could represent an area of inflammation however he has high risk for the development of malignancy. He needs close follow-up for this. We will have him repeat noncontrasted CT chest in 6 months. We also counseled patient on smoking cessation today  in the office. Return to clinic in 6 months after CT complete.    Current Outpatient Medications:    albuterol (VENTOLIN HFA) 108 (90 Base) MCG/ACT inhaler, Inhale 2 puffs into the lungs every 6 (six) hours as needed for wheezing or shortness of breath., Disp: 18 g, Rfl: 2   amphetamine-dextroamphetamine (ADDERALL) 30 MG tablet, Take 1 tablet by mouth daily., Disp: 30 tablet, Rfl: 0   apixaban (ELIQUIS) 5 MG TABS tablet, Take 1 tablet (5 mg total) by mouth 2 (two) times daily., Disp: 180 tablet, Rfl: 1   cholecalciferol (VITAMIN D3) 25 MCG (1000 UNIT) tablet, Take 1 tablet (1,000 Units total) by mouth daily. 1 tablet, Disp: 90 tablet, Rfl: 3   Dermatological Products, Misc. [facility]) lotion, apply to body daily for maintenance, Disp: , Rfl:    diphenhydrAMINE HCl (BENADRYL ALLERGY PO), Take by mouth., Disp: , Rfl:    Doxylamine Succinate, Sleep, (UNISOM PO), 2 capsules, Disp: , Rfl:    DUPIXENT 300 MG/2ML SOPN, SMARTSIG:1 pre-filled pen syringe SUB-Q Every 2 Weeks, Disp: , Rfl:    Fluticasone-Umeclidin-Vilant (TRELEGY ELLIPTA) 200-62.5-25 MCG/ACT AEPB, Inhale 1 puff into the lungs daily., Disp: 28 each, Rfl: 5   hydrOXYzine (ATARAX/VISTARIL) 10 MG tablet, take 1-2 pills po nightly for itching, Disp: 90 tablet, Rfl: 0   lisinopril (ZESTRIL) 20 MG tablet, Take 1 tablet (20 mg total) by mouth daily., Disp: 90 tablet, Rfl: 1   MAGNESIUM MALATE PO, 4050 mg, Disp: , Rfl:    metoprolol succinate (TOPROL XL) 25 MG 24 hr tablet, Take 1 tablet (25 mg total) by mouth daily., Disp: 90 tablet, Rfl: 1   rosuvastatin (CRESTOR) 5 MG tablet, Take 1 tablet (5 mg total) by mouth every other day., Disp: 90 tablet, Rfl: 1   Turmeric (QC TUMERIC COMPLEX PO), Take by mouth., Disp: , Rfl:    Zinc 50 MG CAPS, Take by mouth., Disp: , Rfl:    predniSONE (DELTASONE) 10 MG tablet, Take 2 tablets (20 mg total) by mouth daily. (Patient not taking: Reported on 10/13/2021), Disp: 20 tablet, Rfl: 0    Garner Nash,  DO [facility] 10/13/2021 4:33 PM

## 2021-10-13 NOTE — Patient Instructions (Addendum)
Thank you for visiting Dr. Valeta Harms at [facility]. Today we recommend the following:  Orders Placed This Encounter  Procedures   CT Super D Chest Wo Contrast   Return in about 6 months (around 04/12/2022) for with Dr. Valeta Harms. Come and see me in 6 months.     Please do your part to reduce the spread of COVID-19.

## 2021-11-01 DIAGNOSIS — L2089 Other atopic dermatitis: Secondary | ICD-10-CM | POA: Diagnosis not present

## 2021-11-09 ENCOUNTER — Telehealth: Payer: Self-pay | Admitting: Physician Assistant

## 2021-11-09 ENCOUNTER — Telehealth: Payer: Self-pay | Admitting: Pulmonary Disease

## 2021-11-09 NOTE — Telephone Encounter (Signed)
Called and spoke with patient who states that PCP referred him to [facility] for skin issues. 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 Pt needs to know if he is contagious or not for TB. He was diagnosed with it this morning but unsure if he can be around family or not. Shortly after that he received a call that his father passed away this morning, and would appreciate knowing if he can be around family at this time.  ? ? ?Dr. Valeta Harms please advise  ?

## 2021-11-09 NOTE — Telephone Encounter (Signed)
We referred pt to [facility] for skin issues. 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 ?They could not tell him if he is contagious or not and said he would need CXR but they could not order it. ?After talking to [place] office he got a call from [place] that his father has passed . He is wanting to see what he can do ASAP to get some answers , especially with his father passing, his family is concerned about being around him ? ?

## 2021-11-09 NOTE — Telephone Encounter (Signed)
I called and left a message for the patient to call back

## 2021-11-10 ENCOUNTER — Encounter: Payer: Self-pay | Admitting: *Deleted

## 2021-11-10 NOTE — Telephone Encounter (Signed)
[facility] and they are faxing over notes with labs. They notified HD and referred him to infectious disease for treatment ?

## 2021-11-10 NOTE — Telephone Encounter (Signed)
Called patient to follow up on research study, patient was very concerned about his TB results. Saw there was a message to attempt to call patient. I read Dr. Juline Patch recommendations. Patient very thankful. He will follow up with Research in 2-3 weeks.  ? ?Nothing further at this time.  ?

## 2021-11-15 ENCOUNTER — Ambulatory Visit (INDEPENDENT_AMBULATORY_CARE_PROVIDER_SITE_OTHER): Payer: Medicare HMO | Admitting: Physician Assistant

## 2021-11-15 ENCOUNTER — Encounter: Payer: Self-pay | Admitting: Physician Assistant

## 2021-11-15 ENCOUNTER — Other Ambulatory Visit: Payer: Self-pay

## 2021-11-15 VITALS — BP 122/82 | HR 77 | Ht 72.0 in | Wt 277.0 lb

## 2021-11-15 DIAGNOSIS — L299 Pruritus, unspecified: Secondary | ICD-10-CM | POA: Diagnosis not present

## 2021-11-15 DIAGNOSIS — Z7901 Long term (current) use of anticoagulants: Secondary | ICD-10-CM | POA: Diagnosis not present

## 2021-11-15 DIAGNOSIS — F419 Anxiety disorder, unspecified: Secondary | ICD-10-CM

## 2021-11-15 DIAGNOSIS — E782 Mixed hyperlipidemia: Secondary | ICD-10-CM

## 2021-11-15 DIAGNOSIS — I10 Essential (primary) hypertension: Secondary | ICD-10-CM

## 2021-11-15 DIAGNOSIS — Z0000 Encounter for general adult medical examination without abnormal findings: Secondary | ICD-10-CM | POA: Diagnosis not present

## 2021-11-15 DIAGNOSIS — J449 Chronic obstructive pulmonary disease, unspecified: Secondary | ICD-10-CM

## 2021-11-15 DIAGNOSIS — I714 Abdominal aortic aneurysm, without rupture, unspecified: Secondary | ICD-10-CM | POA: Diagnosis not present

## 2021-11-15 DIAGNOSIS — Z1211 Encounter for screening for malignant neoplasm of colon: Secondary | ICD-10-CM

## 2021-11-15 DIAGNOSIS — L508 Other urticaria: Secondary | ICD-10-CM | POA: Insufficient documentation

## 2021-11-15 DIAGNOSIS — Z6837 Body mass index (BMI) 37.0-37.9, adult: Secondary | ICD-10-CM

## 2021-11-15 MED ORDER — TRELEGY ELLIPTA 200-62.5-25 MCG/ACT IN AEPB: 1.0000 | INHALATION_SPRAY | Freq: Every day | RESPIRATORY_TRACT | 3 refills | Status: DC

## 2021-11-15 MED ORDER — BUSPIRONE HCL 5 MG PO TABS: 5.0000 mg | ORAL_TABLET | Freq: Two times a day (BID) | ORAL | 5 refills | Status: DC

## 2021-11-15 MED ORDER — LISINOPRIL 20 MG PO TABS: 20.0000 mg | ORAL_TABLET | Freq: Every day | ORAL | 3 refills | Status: DC

## 2021-11-15 MED ORDER — METOPROLOL SUCCINATE ER 25 MG PO TB24: 25.0000 mg | ORAL_TABLET | Freq: Every day | ORAL | 3 refills | Status: DC

## 2021-11-15 MED ORDER — APIXABAN 5 MG PO TABS: 5.0000 mg | ORAL_TABLET | Freq: Two times a day (BID) | ORAL | 2 refills | Status: DC

## 2021-11-15 MED ORDER — HYDROXYZINE HCL 10 MG PO TABS: ORAL_TABLET | ORAL | 5 refills | Status: DC

## 2021-11-15 NOTE — Patient Instructions (Signed)
Preventative Care for Adults, Male ?   ?   REGULAR HEALTH EXAMS: ?A routine yearly physical is a good way to check in with your primary care provider about your health and preventive screening. It is also an opportunity to share updates about your health and any concerns you have, and receive a thorough all-over exam.  ?Most health insurance companies pay for at least some preventative services.  Check with your health plan for specific coverages. ? ?WHAT PREVENTATIVE SERVICES DO MEN NEED? ?Adult men should have their weight and blood pressure checked regularly.  ?Men age 35 and older should have their cholesterol levels checked regularly. ?Beginning at age 45 and continuing to age 75, men should be screened for colorectal cancer.  Certain people should may need continued testing until age 85. ?Other cancer screening may include exams for testicular and prostate cancer. ?Updating vaccinations is part of preventative care.  Vaccinations help protect against diseases such as the flu. ?Lab tests are generally done as part of preventative care to screen for anemia and blood disorders, to screen for problems with the kidneys and liver, to screen for bladder problems, to check blood sugar, and to check your cholesterol level. ?Preventative services generally include counseling about diet, exercise, avoiding tobacco, drugs, excessive alcohol consumption, and sexually transmitted infections.   ? ?GENERAL RECOMMENDATIONS FOR GOOD HEALTH: ? ?Healthy diet: ?Eat a variety of foods, including fruit, vegetables, animal or vegetable protein, such as meat, fish, chicken, and eggs, or beans, lentils, tofu, and grains, such as rice. ?Drink plenty of water daily (60 - 80 ounces or 8 - 10 glasses of water a day) ?Decrease saturated fat in the diet, avoid lots of red meat, processed foods, sweets, fast foods, and fried foods. For high cholesterol - Increase fiber intake (Benefiber or Metamucil, Cherrios,  oatmeal, beans, nuts, fruits  and vegetables), limit saturated fats (in fried foods, red meat), can add OTC fish oil supplement, eat fish with Omega-3 fatty acids like salmon and tuna, exercise for 30 minutes 3 - 5 times a week, drink 8 - 10 glasses of water a day. ? ?Exercise: ?Aerobic exercise helps maintain good heart health. At least 30-40 minutes of moderate-intensity exercise is recommended. For example, a brisk walk that increases your heart rate and breathing. This should be done on most days of the week.  ?Find a type of exercise or a variety of exercises that you enjoy so that it becomes a part of your daily life.  Examples are running, walking, swimming, water aerobics, and biking.  For motivation and support, explore group exercise such as aerobic class, spin class, Zumba, Yoga,or  martial arts, etc.   ?Set exercise goals for yourself, such as a certain weight goal, walk or run in a race such as a 5k walk/run.  Speak to your primary care provider about exercise goals. ? ?Disease prevention: ?If you smoke or chew tobacco, find out from your caregiver how to quit. It can literally save your life, no matter how long you have been a tobacco user. If you do not use tobacco, never begin.  ?Maintain a healthy diet and normal weight. Increased weight leads to problems with blood pressure and diabetes.  ?The Body Mass Index or BMI is a way of measuring how much of your body is fat. Having a BMI above 27 increases the risk of heart disease, diabetes, hypertension, stroke and other problems related to obesity. Your caregiver can help determine your BMI and based on it develop   an exercise and dietary program to help you achieve or maintain this important measurement at a healthful level. ?High blood pressure causes heart and blood vessel problems.  Persistent high blood pressure should be treated with medicine if weight loss and exercise do not work.  ?Fat and cholesterol leaves deposits in your arteries that can block them. This causes heart  disease and vessel disease elsewhere in your body.  If your cholesterol is found to be high, or if you have heart disease or certain other medical conditions, then you may need to have your cholesterol monitored frequently and be treated with medication.  ?Ask if you should have a stress test if your history suggests this. A stress test is a test done on a treadmill that looks for heart disease. This test can find disease prior to there being a problem. ?Avoid drinking alcohol in excess (more than two drinks per day).  Avoid use of street drugs. Do not share needles with anyone. Ask for professional help if you need assistance or instructions on stopping the use of alcohol, cigarettes, and/or drugs. ?Brush your teeth twice a day with fluoride toothpaste, and floss once a day. Good oral hygiene prevents tooth decay and gum disease. The problems can be painful, unattractive, and can cause other health problems. Visit your dentist for a routine oral and dental check up and preventive care every 6-12 months.  ?Look at your skin regularly.  Use a mirror to look at your back. Notify your caregivers of changes in moles, especially if there are changes in shapes, colors, a size larger than a pencil eraser, an irregular border, or development of new moles. ? ?Safety: ?Use seatbelts 100% of the time, whether driving or as a passenger.  Use safety devices such as hearing protection if you work in environments with loud noise or significant background noise.  Use safety glasses when doing any work that could send debris in to the eyes.  Use a helmet if you ride a bike or motorcycle.  Use appropriate safety gear for contact sports.  Talk to your caregiver about gun safety. ?Use sunscreen with a SPF (or skin protection factor) of 15 or greater.  Lighter skinned people are at a greater risk of skin cancer. Don?t forget to also wear sunglasses in order to protect your eyes from too much damaging sunlight. Damaging sunlight can  accelerate cataract formation.  ?Practice safe sex. Use condoms. Condoms are used for birth control and to help reduce the spread of sexually transmitted infections (or STIs).  Some of the STIs are gonorrhea (the clap), chlamydia, syphilis, trichomonas, herpes, HPV (human papilloma virus) and HIV (human immunodeficiency virus) which causes AIDS. The herpes, HIV and HPV are viral illnesses that have no cure. These can result in disability, cancer and death.  ?Keep carbon monoxide and smoke detectors in your home functioning at all times. Change the batteries every 6 months or use a model that plugs into the wall.  ? ?Vaccinations: ?Stay up to date with your tetanus shots and other required immunizations. You should have a booster for tetanus every 10 years. Be sure to get your flu shot every year, since 5%-20% of the [place] population comes down with the flu. The flu vaccine changes each year, so being vaccinated once is not enough. Get your shot in the fall, before the flu season peaks.   ?Other vaccines to consider: ?Pneumococcal vaccine to protect against certain types of pneumonia.  This is normally recommended for adults age   65 or older.  However, adults younger than 70 years old with certain underlying conditions such as diabetes, heart or lung disease should also receive the vaccine. ?Shingles vaccine to protect against Varicella Zoster if you are older than age 60, or younger than 70 years old with certain underlying illness. ?Hepatitis A vaccine to protect against a form of infection of the liver by a virus acquired from food. ?Hepatitis B vaccine to protect against a form of infection of the liver by a virus acquired from blood or body fluids, particularly if you work in health care. ?If you plan to travel internationally, check with your local health department for specific vaccination recommendations. ? ?Cancer Screening: ?Most routine colon cancer screening begins at the age of 50. On a yearly basis,  doctors may provide special easy to use take-home tests to check for hidden blood in the stool. Sigmoidoscopy or colonoscopy can detect the earliest forms of colon cancer and is life saving. These tests use a smal

## 2021-11-15 NOTE — Progress Notes (Signed)
Steven Hayden is a 71 y.o. male who presents for annual wellness visit and follow-up on chronic medical conditions.   ? ?He has the following concerns: ? ?Reports having dry skin on buttocks; has been itching for 1 year and 1 month and reports he was trying to think what happened last year that may have caused it; states he remembers getting the "pfizer vaccine" & has noticed having a lot more skin tags around his neck; reports that he has discussed this and his dry, itchy hands with Dermatology; states he's tried several lotion and salves but nothing has stopped his itching; is still going to Dermatology; reports yesterday he buried his Father and skin was beet red and itchy; also ankles swell and get itchy; has been told that it is his "nerves" and asks if he should be taking something for his "nerves" like Xanax; Recent labs have been scanned in & patient declines to have more routine labs drawn today.  ? ?Pulmonology - June Leap, DO ?Dermtology - Gloris Manchester, PA-C ?Vascular Surgery - Leontine Locket, PA-C ?Cardiac & Thoracic Surgery - Ellwood Handler, PA-C ?Infectious Disease - Prudencio Pair, MD ? ?Immunization History  ?Administered Date(s) Administered  ? Influenza Whole 06/07/2010  ? Influenza,inj,Quad PF,6+ Mos 06/22/2014, 05/29/2016, 06/08/2017, 09/13/2018  ? Influenza-Unspecified 09/16/2019  ? PFIZER(Purple Top)SARS-COV-2 Vaccination 11/08/2019, 08/26/2020  ? Pneumococcal Conjugate-13 06/08/2017  ? ?Patient Care Team: ?Marcellina Millin as PCP - General (Physician Assistant) ?Garner Nash, DO as Consulting Physician (Pulmonary Disease) ?Ashley Royalty, [place] (Physician Assistant) ? ? ?Depression Screening: ?Flowsheet Row Clinical Support from 11/15/2021 in [place]  ?PHQ-2 Total Score 2  ? ?  ?  ?Flowsheet Row Clinical Support from 11/15/2021 in [place]  ?PHQ-9 Total Score 5  ? ?  ? ? ?Falls screen:  ?Fall Risk  11/15/2021 02/03/2021 06/22/2020 04/15/2020 12/15/2019   ?Falls in the past year? 1 0 0 0 0  ?Number falls in past yr: 0 0 - 0 0  ?Injury with Fall? 1 0 - - 0  ?Comment Hurt right knee - - - -  ?Risk for fall due to : No Fall Risks No Fall Risks - - -  ?Follow up Education provided;Falls evaluation completed Falls evaluation completed - - -  ?  ? ?Functional Status Survey: ?  ? ?   ? ?End of Life Discussion:  Patient does not have a living will and medical power of attorney ? ? ?PMH, PSH, SH and FH were reviewed and updated ? ?ROS:  Patient denies anorexia, fever, weight changes, headaches, vision changes, decreased hearing, URI symptoms, chest pain, palpitations, dizziness, syncope, dyspnea on exertion, cough, swelling, nausea, vomiting, diarrhea, constipation, abdominal pain, melena, hematochezia, indigestion/heartburn, hematuria, incontinence, dysuria, discharge, odor or itch, genital lesions, joint pains, numbness, tingling, weakness, tremor, suspicious skin lesions, depression, anxiety, abnormal bleeding/bruising, or enlarged lymph nodes. ? ? ?PHYSICAL EXAM: ? ?BP 122/82   Pulse 77   Ht 6' (1.829 m)   Wt 277 lb (125.6 kg)   SpO2 93%   BMI 37.57 kg/m?  ? ?BP Readings from Last 5 Encounters:  ?11/15/21 122/82  ?10/13/21 134/90  ?09/19/21 126/84  ?08/12/21 140/80  ?08/11/21 (!) 148/100  ? ? ? ?General appearance: alert, no distress, well developed, well nourished ?HEENT: normocephalic, sclerae anicteric, conjunctiva pink and moist, TMs normal ?Oral cavity: wearing a mask ?Neck: supple, no lymphadenopathy, no thyromegaly, no masses ?Heart: RRR, normal S1, S2, no murmurs ?Lungs: CTA bilaterally, no wheezes,  rhonchi, or rales ?Abdomen: +bs, soft, non tender, non distended, no masses ?Pulses: 2+ radial pulses, 2+ pedal pulses, normal cap refill ?Ext: no edema ? ? ? ?ASSESSMENT/PLAN: ?Assessment: ?Encounter Diagnoses  ?Name Primary?  ? Medicare annual wellness visit, subsequent Yes  ? Chronic obstructive pulmonary disease, unspecified COPD type ([place])   ? Essential  hypertension   ? Mixed hyperlipidemia   ? Class 2 severe obesity due to excess calories with serious comorbidity and body mass index (BMI) of 37.0 to 37.9 in adult [facility])   ? Body mass index (BMI) of 37.0 to 37.9 in adult   ? Abdominal aortic aneurysm (AAA) without rupture, unspecified part   ? Current use of long term anticoagulation   ? Chronic pruritus   ? Screening for colon cancer   ? Anxiety   ? ? ? ?Plan: ? ? ?Steven Hayden was seen today for annual exam. ? ?Diagnoses and all orders for this visit: ? ?Medicare annual wellness visit, subsequent ? ?Chronic obstructive pulmonary disease, unspecified COPD type ([place]) ?-     Fluticasone-Umeclidin-Vilant (TRELEGY ELLIPTA) 200-62.5-25 MCG/ACT AEPB; Inhale 1 puff into the lungs daily. ? ?Essential hypertension ?-     metoprolol succinate (TOPROL XL) 25 MG 24 hr tablet; Take 1 tablet (25 mg total) by mouth daily. ?-     lisinopril (ZESTRIL) 20 MG tablet; Take 1 tablet (20 mg total) by mouth daily. ? ?Mixed hyperlipidemia ? ?Class 2 severe obesity due to excess calories with serious comorbidity and body mass index (BMI) of 37.0 to 37.9 in adult [facility]) ? ?Body mass index (BMI) of 37.0 to 37.9 in adult ? ?Abdominal aortic aneurysm (AAA) without rupture, unspecified part ?-     apixaban (ELIQUIS) 5 MG TABS tablet; Take 1 tablet (5 mg total) by mouth 2 (two) times daily. ? ?Current use of long term anticoagulation ? ?Chronic pruritus ?-     hydrOXYzine (ATARAX) 10 MG tablet; take 1-2 pills po nightly for itching ? ?Screening for colon cancer ?-     Cologuard ? ?Anxiety ?-     busPIRone (BUSPAR) 5 MG tablet; Take 1 tablet (5 mg total) by mouth 2 (two) times daily. ? ? ?Offered patient Buspar for his anxiety and he is willing to try it. ?Return in 6 months for follow up. ? ?Discussed PSA screening (risks/benefits), recommended at least 30 minutes of aerobic activity at least 5 days/week; proper sunscreen use reviewed; healthy diet and alcohol recommendations (less than or equal to 2  drinks/day) reviewed; regular seatbelt use; changing batteries in smoke detectors, use of carbon monoxide detectors. Immunization recommendations discussed.  Colonoscopy recommendations reviewed. ? ? ?Medicare Attestation ?I have personally reviewed: ?The patient's medical and social history ?Their use of alcohol, tobacco or illicit drugs ?Their current medications and supplements ?The patient's functional ability including ADLs,fall risks, home safety risks, cognitive, and hearing and visual impairment ?Diet and physical activities ?Evidence for depression or mood disorders ? ?The patient's weight, height, BMI have been recorded in the chart.  I have made referrals, counseling, and provided education to the patient based on review of the above and I have provided the patient with a written personalized care plan for preventive services.   ? ?Patient asked to take my picture and I declined and told him that's not allowed. ? ? ?Irene Pap, PA-C   11/15/2021   ?

## 2021-11-16 NOTE — Telephone Encounter (Signed)
I have requested labs again to come over ?

## 2021-11-16 NOTE — Telephone Encounter (Signed)
This has already been scanned in and it was signed off by lynne. See 11/01/21 labs ?

## 2021-11-18 ENCOUNTER — Other Ambulatory Visit: Payer: Self-pay

## 2021-11-18 ENCOUNTER — Ambulatory Visit (INDEPENDENT_AMBULATORY_CARE_PROVIDER_SITE_OTHER): Payer: Medicare HMO | Admitting: Internal Medicine

## 2021-11-18 VITALS — BP 159/98 | HR 69 | Resp 16 | Ht 72.0 in | Wt 281.0 lb

## 2021-11-18 DIAGNOSIS — Z227 Latent tuberculosis: Secondary | ICD-10-CM | POA: Diagnosis not present

## 2021-11-18 NOTE — Patient Instructions (Signed)
Please make a follow up appointment with me in 2-4 weeks ? ?I will discuss with your lung doctor regarding the ct chest finding and see if there is any concern about active TB infection ?

## 2021-11-18 NOTE — Progress Notes (Signed)
?  ? ? ? ? ?[place] for Infectious Disease ? ?Reason for Consult:positive quantiferon gold ?Referring Provider: his dermatologist ? ? ? ?Patient Active Problem List  ? Diagnosis Date Noted  ? Mixed hyperlipidemia 11/15/2021  ? Class 2 severe obesity due to excess calories with serious comorbidity and body mass index (BMI) of 37.0 to 37.9 in adult [facility]) 11/15/2021  ? Body mass index (BMI) of 37.0 to 37.9 in adult 11/15/2021  ? Current use of long term anticoagulation 11/15/2021  ? Chronic urticaria 11/15/2021  ? Chronic pruritus 11/15/2021  ? Anxiety 11/15/2021  ? Mild sleep apnea 08/12/2021  ? Paroxysmal atrial fibrillation ([place]) 08/12/2021  ? Auditory hallucinations 02/03/2021  ? Prediabetes   ? Statin declined 04/16/2020  ? Cigarette smoker motivated to quit 04/15/2020  ? Umbilical hernia without obstruction and without gangrene 04/15/2020  ? Chronic kidney disease 04/15/2020  ? Abnormal findings on diagnostic imaging of lung 02/23/2020  ? At risk for obstructive sleep apnea 02/23/2020  ? Schizophrenia ([place])   ? AAA (abdominal aortic aneurysm) without rupture 05/17/2014  ? COPD (chronic obstructive pulmonary disease) ([place]) 09/16/2013  ? TOBACCO USE 07/14/2008  ? Attention deficit disorder 05/13/2007  ? Essential hypertension 05/13/2007  ? ? ? ? ?HPI: Steven Hayden is a 71 y.o. male afib, htn/hlp, copd, former smoker, ascending thoracic aorta aneurysm, pulmonary GGO lesion RUL, here for a positive quantiferon gold testings ? ? ?Reviewed epic/notes/labs scanned into our epic ? ?Patient still smokes  ? ?He has been seen by pulmonary for the RUL ggo. He had initial chest ct 09/15/21 that showed a new 1.2 cm ggo within right upper lobe. He saw dr Valeta Harms on 10/13/21 who ordered a repeat ct of the chest which patient haven't yet done.  ? ? ?He has chronic itch >1 year and seeing dermatology. His dermatologist is about to start him on some biologics so the hepatitis/tb screen were done on 11/01/21. He has some new  medication before the itch occurs. He doesn't drink etoh much. No family hx liver disease. Father has melanoma ? ?He has no prior TB screening. ? ?Patient "gaining" weight like crazy. ?He denies chronic cough ? ?Once in a while he has profuse sweating not only at night but during the day and he would feel fine. This occurs about once a month ? ?No joint pain/back pain ? ?Born in [place], [place] ?No travel outside the country ?No hx homelessness, hanging out at the bars, incarcerated ? 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 ?He didn't grow up with anyone with tuberculosis ? ? ?He had colonoscopy years ago that was negative ?He said he never had prostate cancer screening ? ? ?Review of Systems: ?ROS ?Generalized pruritus chronic ? ? ? ? ? ? ?Past Medical History:  ?Diagnosis Date  ? AAA (abdominal aortic aneurysm)   ? ADD (attention deficit disorder)   ? Atrial fibrillation ([place])   ? Bipolar affective disorder ([place])   ? COPD (chronic obstructive pulmonary disease) ([place])   ? Hyperlipidemia   ? Hypertension   ? Obesity (BMI 30-39.9) 04/15/2020  ? Prediabetes   ? Hgb A1c 6.4% August 2021  ? Schizophrenia ([place])   ? Statin declined 04/16/2020  ? States he will not take a statin. Has taken them in past.   ? Tobacco abuse   ? ? ?Social History  ? ?Tobacco Use  ? Smoking status: Some Days  ?  Packs/day: 2.00  ?  Years: 50.00  ?  Pack years: 100.00  ?  Types: Cigarettes  ? Smokeless tobacco: Never  ? Tobacco comments:  ?  currently smoking 1ppd as of 10/07/20  ?Substance Use Topics  ? Alcohol use: Yes  ?  Alcohol/week: 20.0 standard drinks  ?  Types: 20 Shots of liquor per week  ?  Comment: 3 shots every other day  ? Drug use: No  ? ? ?Family History  ?Problem Relation Age of Onset  ? Hypertension Brother   ? Hyperlipidemia Brother   ? Hypertension Father   ? Hyperlipidemia Father   ? Cancer Father   ? ? ?Allergies  ?Allergen Reactions  ? Codeine Phosphate   ?  REACTION: swelling of uvula, hives  ? Dilaudid  [Hydromorphone]   ? ? ?OBJECTIVE: ?There were no vitals filed for this visit. ?There is no height or weight on file to calculate BMI. ? ? ?Physical Exam ? ?General/constitutional: no distress, pleasant ?HEENT: Normocephalic, PER, Conj Clear, EOMI, Oropharynx clear ?Neck supple ?CV: rrr no mrg ?Lungs: clear to auscultation, normal respiratory effort ?Abd: Soft, Nontender ?Ext: no edema ?Skin: hyperkeratotic polysquamous palms with rather diffuse trace of erythema on skin trunk/extremity.  ?Lymph: no cervical adenopathy ?Neuro: nonfocal ?MSK: no peripheral joint swelling/tenderness/warmth; back spines nontender ? ? ? ?Lab: ?Lab Results  ?Component Value Date  ? WBC 7.7 04/15/2020  ? HGB 16.6 04/15/2020  ? HCT 51.0 04/15/2020  ? MCV 96 04/15/2020  ? PLT 189 04/15/2020  ? ?Last metabolic panel ?Lab Results  ?Component Value Date  ? GLUCOSE 127 (H) 04/15/2020  ? NA 142 04/15/2020  ? K 4.2 04/15/2020  ? CL 99 04/15/2020  ? CO2 30 (H) 04/15/2020  ? BUN 19 04/15/2020  ? CREATININE 1.20 09/15/2021  ? GFRNONAA 69 04/15/2020  ? CALCIUM 9.3 04/15/2020  ? PROT 7.0 04/15/2020  ? ALBUMIN 4.3 04/15/2020  ? LABGLOB 2.7 04/15/2020  ? AGRATIO 1.6 04/15/2020  ? BILITOT 0.2 04/15/2020  ? ALKPHOS 70 04/15/2020  ? AST 24 04/15/2020  ? ALT 38 04/15/2020  ? ANIONGAP 12 05/13/2018  ? ? 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 ?Serology: ? ?Imaging: ? ? ?Assessment/plan: ?Problem List Items Addressed This Visit   ?None ?Visit Diagnoses   ? ? TB lung, latent    -  Primary  ? ?  ? ? ?We reviewed the natural history of tb exposure and difference between latent/active tb ? ?We reviewed risk for latent tb to become active. He'll be on biologic for his pruritic/eczema condition ? ?I wonder what the imaging of his chest RUL ggo means. I am not sure when he contracted/seroconverted the TB screen ? ?While adults in [place] TB is usually reactivation, I query if the  lung lesion could be primary tb pna and the quantiferon gold represent recent seroconversion. He doesn't appear to have ghon focus/complex at this time but these usually represent chronic changes of tb rather than initial ? ?Dr Valeta Harms  had planned a 6 months repeat ct scan for his but I will ask him to comment on possibility of recent tb exposure ? ?I would like to sort this out in the next few weeks so I can treat him appropriately for whether latent tb or active tb ? ? ?-please make a follow up appointment with me in 2-4 weeks ?-I will forward note to dr Valeta Harms regarding possibility of recent tb exposure in setting of what he sees on  chest ct and this single value of the quantiferon gold ? ? ? ? ? ? ?Follow-up: Return in about 4 weeks (around 12/16/2021). ? ?Jabier Mutton, MD ?[facility] ?[place] ?[phone] pager   [phone] cell ?11/18/2021, 3:10 PM ? ?

## 2021-11-22 NOTE — Addendum Note (Signed)
Addended byPrudencio Pair T on: 11/22/2021 12:03 PM ? ? Modules accepted: Orders ? ?

## 2021-11-28 ENCOUNTER — Telehealth: Payer: Self-pay

## 2021-11-28 NOTE — Telephone Encounter (Signed)
Spoke to Lauren at [facility] to inform her that Dr.Vu stated patient doesn't have any symptoms of pulmonary TB. Lauren voiced her understanding and they will proceed with scheduling.  ? ? ?Steven Hayden, CMA ? ?

## 2021-12-01 ENCOUNTER — Encounter: Payer: Medicare HMO | Admitting: *Deleted

## 2021-12-01 ENCOUNTER — Ambulatory Visit (HOSPITAL_COMMUNITY)
Admission: RE | Admit: 2021-12-01 | Discharge: 2021-12-01 | Disposition: A | Discharge status: Home / Self Care | Payer: Medicare HMO | Source: Ambulatory Visit | Attending: Internal Medicine | Admitting: Internal Medicine

## 2021-12-01 DIAGNOSIS — I7121 Aneurysm of the ascending aorta, without rupture: Secondary | ICD-10-CM | POA: Diagnosis not present

## 2021-12-01 DIAGNOSIS — Z227 Latent tuberculosis: Secondary | ICD-10-CM | POA: Insufficient documentation

## 2021-12-01 DIAGNOSIS — R911 Solitary pulmonary nodule: Secondary | ICD-10-CM

## 2021-12-01 DIAGNOSIS — Z006 Encounter for examination for normal comparison and control in clinical research program: Secondary | ICD-10-CM

## 2021-12-01 DIAGNOSIS — J9811 Atelectasis: Secondary | ICD-10-CM | POA: Diagnosis not present

## 2021-12-01 IMAGING — CT CT CHEST W/O CM
2 of 4 series · 15 of 36 positions shown, 18 images · non-contrast
Comparison: [DATE]

[DATE]

CLINICAL DATA: Concern for tuberculosis pneumonia. Follow up right
chest lesion.



[Series 2: thorax · axial · 0.92mm/px · z∈[-267,+27]mm · 12 of 175 slices shown, 15 images]
[im 14/175  mediastinal]
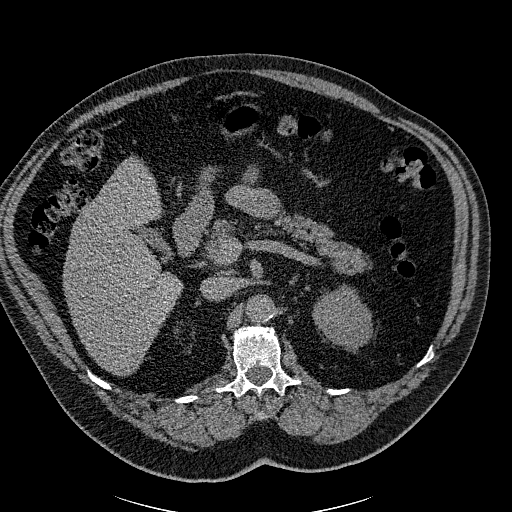
[im 14/175  lung]
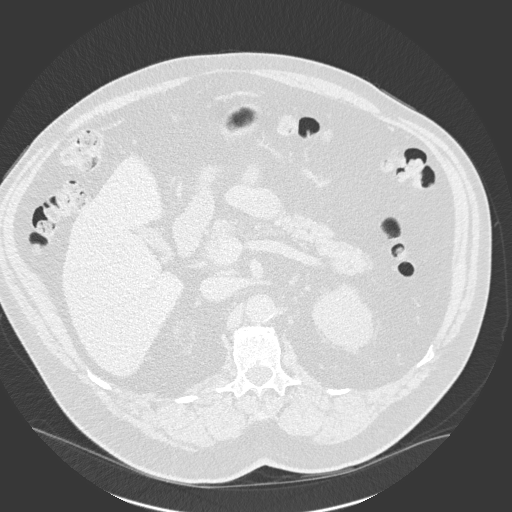
[im 27/175  lung]
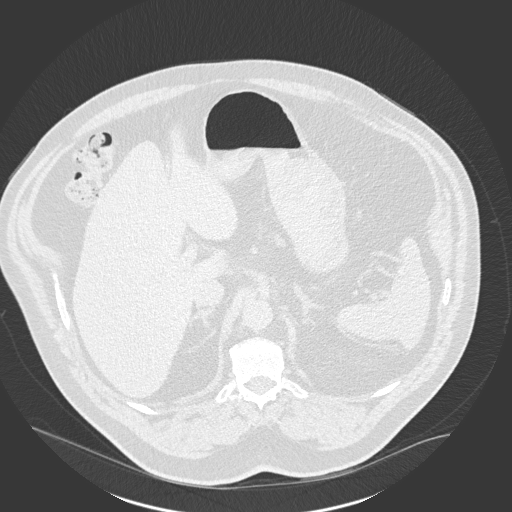
[im 41/175  lung]
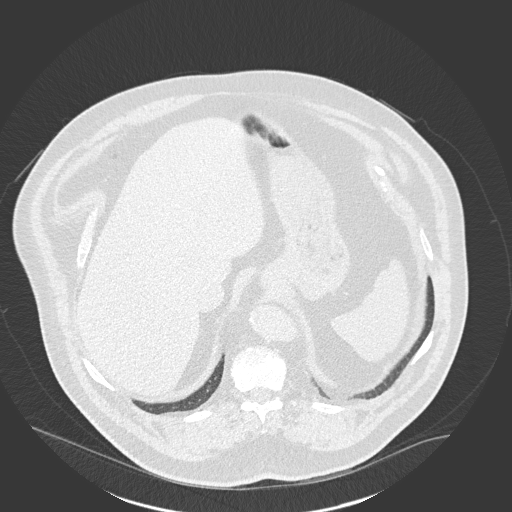
[im 54/175  lung]
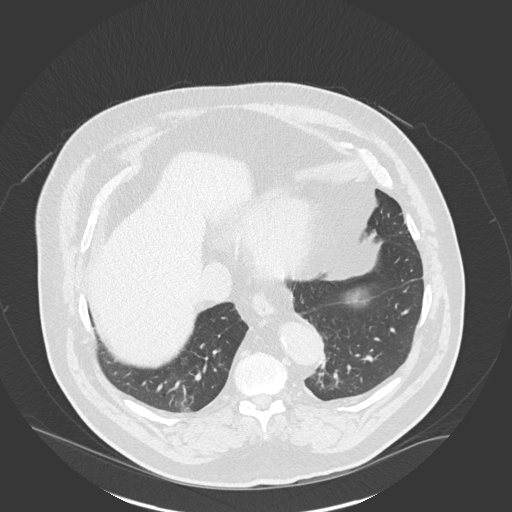
[im 67/175  mediastinal]
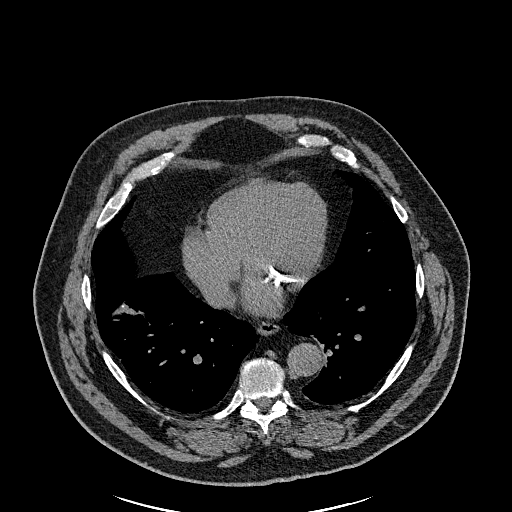
[im 67/175  lung]
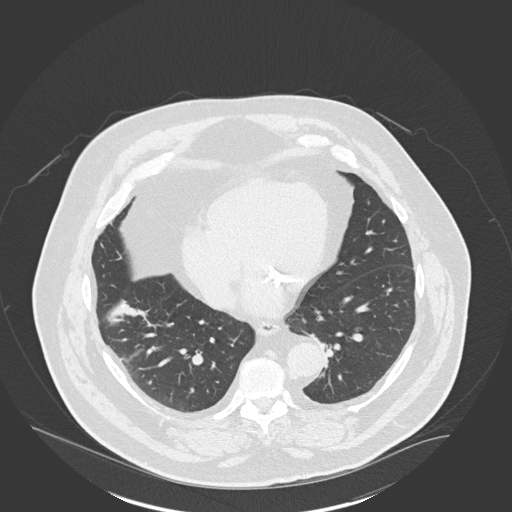
[im 81/175  lung]
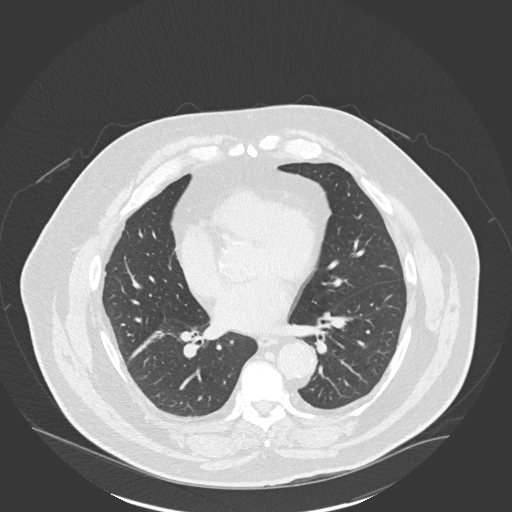
[im 94/175  lung]
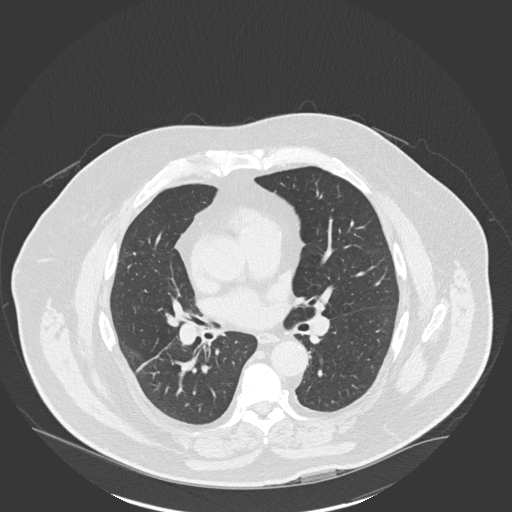
[im 108/175  lung]
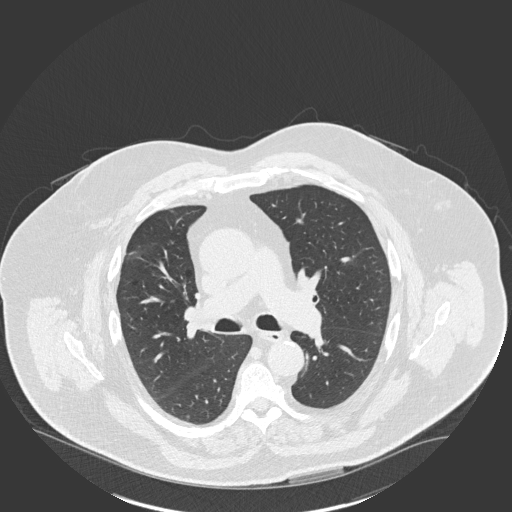
[im 121/175  mediastinal]
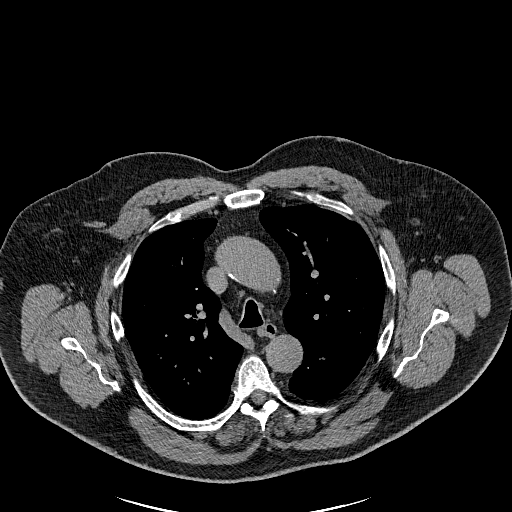
[im 121/175  lung]
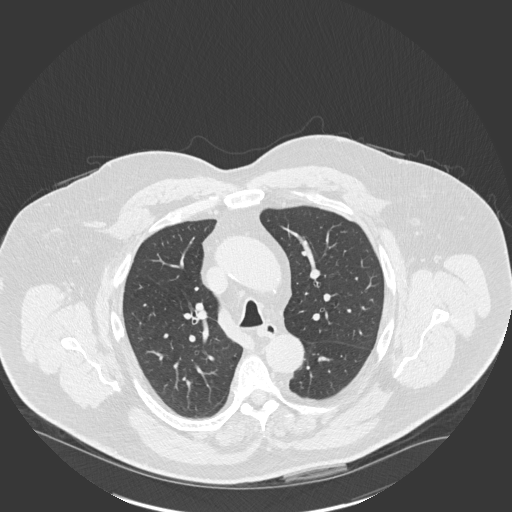
[im 134/175  lung]
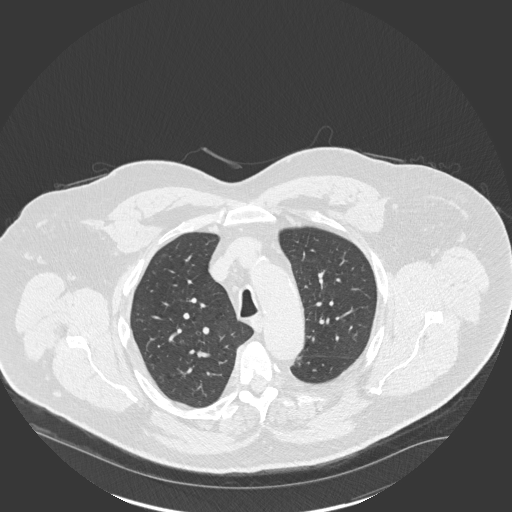
[im 148/175  lung]
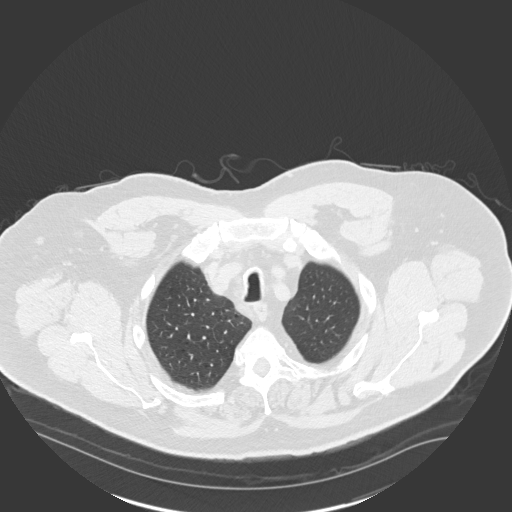
[im 161/175  lung]
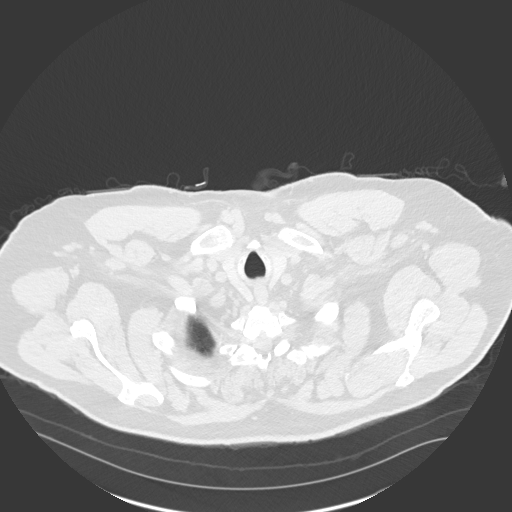

[Series 5: coronal · coronal · 0.68mm/px · 3 of 190 slices shown]
[im 38/190  lung]
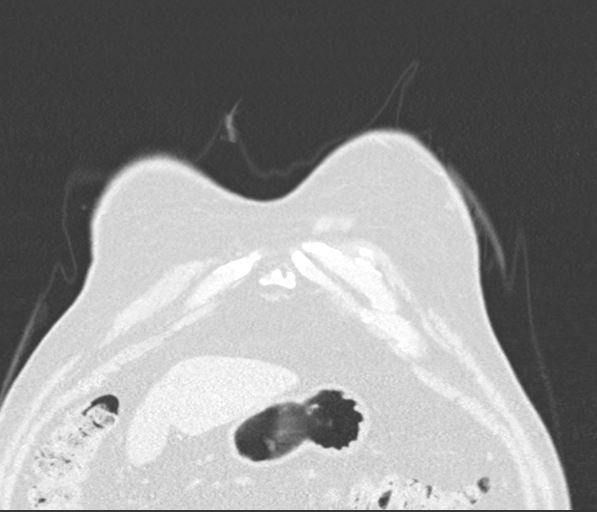
[im 76/190  lung]
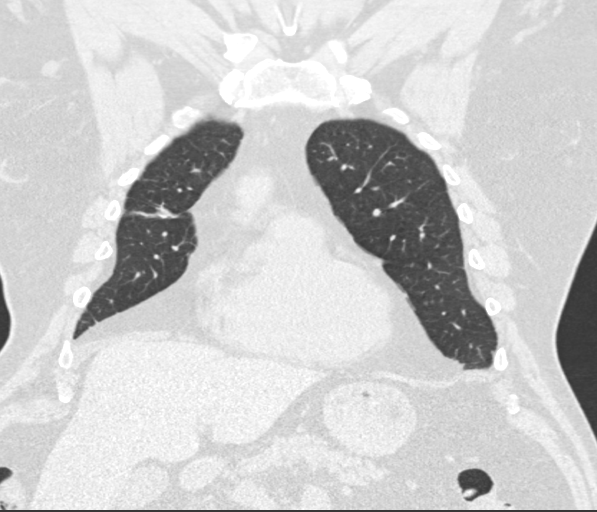
[im 114/190  lung]
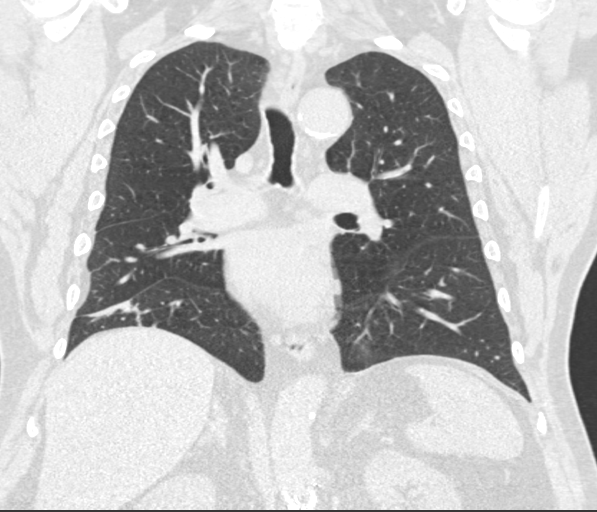

[15 of 36 positions shown; findings below may reference images not displayed]

FINDINGS: Cardiovascular: Heart size within normal limits. Coronary
calcifications are noted. There has been slight interval increase in
size of ascending thoracic aortic aneurysm which now measures 4.5 x
4.3 cm.

Mediastinum/Nodes: Mild mediastinal lipomatosis. No enlarged
mediastinal, supraclavicular, or axillary lymph nodes.

Lungs/Pleura: Mild thickening of the right lower lobe bronchus and
bronchials are similar to prior exam and consistent with chronic
inflammation. Mild interval increase of atelectasis in the right
lower lobe, abutting the posterior aspect of the major fissure.
Linear atelectasis abutting the superior aspect of the right minor
fissure is unchanged from prior exams.

Previously seen right upper lobe ground-glass nodule has resolved.
Calcified lingular nodule consistent with prior granulomatous
inflammation.

Upper Abdomen: 2 mm nonobstructing calculus present the upper pole
of the left kidney. Abdominal aortic endograft partially visualized.

Musculoskeletal: No chest wall mass or suspicious bone lesions
identified.
IMPRESSION: 1. Interval resolution of previously seen right upper lobe
ground-glass nodule. Persistent bronchial wall thickening in the
right lower lobe consistent with chronic bronchitis. Progressive
increase of atelectasis in the right lower lobe. Unchanged
atelectasis of the right upper lobe. Findings may be related to
chronic aspiration. Please correlate with patient's symptoms.
2. Aneurysmal dilatation of the ascending thoracic aorta measuring
up to 4.5 x 4.3 cm. Ascending thoracic aortic aneurysm. Recommend
semi-annual imaging followup by CTA or MRA and referral to
cardiothoracic surgery if not already obtained. This recommendation
follows [1J] ACCF/AHA/AATS/ACR/ASA/SCA/MERILINE/MERILINE/MERILINE/MERILINE Guidelines
for the Diagnosis and Management of Patients With Thoracic Aortic
Disease. Circulation. [1J]; 121: [id number]. Aortic aneurysm NOS
([1J]-[1J])

Aortic aneurysm NOS ([1J]-[1J]).

## 2021-12-01 NOTE — Research (Signed)
Title: NIGHTINGALE: CliNIcal Utility of ManaGement of Patients witH CT and LDCT Identified Pulmonary Nodules UsinG the Percepta NasAL Swab ClassifiEr -- with Familiarization ?  ?Protocol #: [id number] Sponsor: [employer] ?  ?Protocol Revision 1 dated 01Sep2022 and confirmed current on today's visit, IRB approved Revision 1 on 29Dec2022. ?  ?Objectives:  ?Primary: To evaluate if use of the Percepta Nasal Swab test in the diagnostic work up ?of newly identified pulmonary nodules reduces the number of invasive procedures in the ?group classified as low-risk by the test and that are benign as compared to a control ?group managed without a Percepta Nasal Swab test result. ?                  A newly identified nodule is defined as any nodule first identified on imaging ?                  <90 days prior to nasal sample collection that hasn?t undergone a diagnostic ?                   procedure for the management of their index nodule prior to enrollment. ?                  CT imaging includes conventional CT, LDCT, HRCT ?                  Benign diagnosis is defined as a specific diagnosis of a benign condition, ?                   radiographic resolution or stability at ? 24 months, or no cytological, ?                   radiological, or pathological evidence of cancer. ?                  Procedures will be categorized as either invasive or non-invasive in the Data ?                   Management Plan (DMP). ?Secondary: To evaluate if use of the Percepta Nasal Swab test in the diagnostic work ?up of newly identified pulmonary nodules increases the proportion of subjects classified ?as high-risk by the test and have primary lung cancer that go directly to appropriate ?therapy as compared to a control group managed without a Percepta Nasal Swab test ?result. ?                   Proportion of subjects that go directly to appropriate therapy is defined as those ?                    subjects that undergo surgery, ablative  or other appropriate therapy as the next ?                    step after the Percepta Nasal Swab test result without intervening non-surgical ?                    procedures ?                            a. Non-surgical procedures include diagnostic PET, but not PET for ?                                  staging purposes. ?                            b. Appropriate therapies will be defined in the CRF. ?                   A newly identified nodule is defined as any nodule first identified on imaging ?                   <90 days prior to nasal sample collection. ?                   Lung cancer diagnosis is defined as established by cytology or pathology, or in ?                    circumstances where a presumptive diagnosis of cancer led to definitive ?                    ablative or other appropriate therapy without pathology. ?Key Inclusion Criteria: ? Inclusion Criteria: ? Able to tolerate nasal epithelial specimen collection ? Signed written Informed Consent obtained ? Subject clinical history available for review by sponsor and regulatory agencies ? New nodule first identified on imaging < 90 days prior to nasal sample collection ?(index nodule) ? CT report available for index nodule ? 89 - 29 years of age ? Current or former smoker (>100 cigarettes in a lifetime) ? Pulmonary nodule ?30 mm detected by CT ? ?Key Exclusion Criteria: ?Exclusion Criteria ? Subject has undergone a diagnostic procedure for the management of their ?index nodule after the index CT and prior to enrollment ? Active cancer (other than non-melanoma skin cancer) ? Prior primary lung cancer (prior non-lung cancer acceptable) ? Prior participation in this study (i.e., subjects may not be enrolled more than ?once) ? Current active treatment with an investigational device or drug (patients in trial ?follow up period are okay if intervention phase is complete) ? Patient enrolled or planned to be enrolled in another clinical trial that may ?influence  management of the patient?s nodule ? Concurrent or planned use of tools or tests for assigning lung nodule risk of ?malignancy (e.g., genomic or proteomic blood tests) other than clinically ?validated risk calculators ? ?Clinical Research Coordinator / Research RN note : This visit is for enrollment /baseline Subject [id number] with DOB: [id number] on 30Mar2023 for the above protocol is an Enrollment Visit and is for purpose of research.  ?  ?Subject expressed interest and consent in continuing as a study subject. Subject confirmed contact information (e.g. address, telephone, email). Subject thanked for participation in research and contribution to science.   ?  ?During this visit on 30Mar2023, the subject reviewed and signed the consent form, provided demographics, and had a nasal swab collected per the above referenced protocol. Please refer to the subject's paper source binder for further details.  ? ?PI, Dr. Valeta Harms, was present for consenting process. ? ?  ?Signed by ?Jaye Beagle RN, CRN II ? ?

## 2021-12-05 ENCOUNTER — Telehealth: Payer: Self-pay | Admitting: Family Medicine

## 2021-12-05 ENCOUNTER — Other Ambulatory Visit: Payer: Self-pay

## 2021-12-05 NOTE — Telephone Encounter (Signed)
Steven Hayden is ok with seeing [name] as a patient.  Called pt back reached vm lmtrc. ?[name] is ok with switch. ?

## 2021-12-13 NOTE — Addendum Note (Signed)
Addended by: Francis Gaines on: 12/13/2021 12:38 PM ? ? Modules accepted: Level of Service ? ?

## 2021-12-20 ENCOUNTER — Other Ambulatory Visit: Payer: Self-pay

## 2021-12-20 ENCOUNTER — Ambulatory Visit: Payer: Medicare HMO | Admitting: Internal Medicine

## 2021-12-20 VITALS — BP 128/83 | HR 71 | Resp 16 | Ht 72.0 in | Wt 281.0 lb

## 2021-12-20 DIAGNOSIS — Z227 Latent tuberculosis: Secondary | ICD-10-CM | POA: Diagnosis not present

## 2021-12-20 MED ORDER — VITAMIN B-6 50 MG PO TABS: 50.0000 mg | ORAL_TABLET | Freq: Every day | ORAL | 1 refills | Status: AC

## 2021-12-20 MED ORDER — ISONIAZID 300 MG PO TABS: 300.0000 mg | ORAL_TABLET | Freq: Every day | ORAL | 1 refills | Status: AC

## 2021-12-20 NOTE — Patient Instructions (Signed)
I will start treating you for latent tb today ?Isoniazid 300 mg take 1 tablet a day ?Vitamin b6 50 mg take together each day with the isoniazid to prevent nerve damage ? ? ?The duration planned is 6 months from today to take the 2 medications above ? ?However your dermatologist can start treating you with the immunosuppressant medication for your skin condition 6-8 weeks after we start our treatment ? ? ?See me in 4-6 weeks to make sure your liver is tolerating the medications ? ? ?

## 2021-12-20 NOTE — Progress Notes (Signed)
?  ? ? ? ? ?[facility] ? ?Reason for Consult:positive quantiferon gold ?Referring Provider: his dermatologist ? ? ? ?Patient Active Problem List  ? Diagnosis Date Noted  ? Mixed hyperlipidemia 11/15/2021  ? Class 2 severe obesity due to excess calories with serious comorbidity and body mass index (BMI) of 37.0 to 37.9 in adult [facility]) 11/15/2021  ? Body mass index (BMI) of 37.0 to 37.9 in adult 11/15/2021  ? Current use of long term anticoagulation 11/15/2021  ? Chronic urticaria 11/15/2021  ? Chronic pruritus 11/15/2021  ? Anxiety 11/15/2021  ? Mild sleep apnea 08/12/2021  ? Paroxysmal atrial fibrillation ([place]) 08/12/2021  ? Auditory hallucinations 02/03/2021  ? Prediabetes   ? Statin declined 04/16/2020  ? Cigarette smoker motivated to quit 04/15/2020  ? Umbilical hernia without obstruction and without gangrene 04/15/2020  ? Chronic kidney disease 04/15/2020  ? Abnormal findings on diagnostic imaging of lung 02/23/2020  ? At risk for obstructive sleep apnea 02/23/2020  ? Schizophrenia ([place])   ? AAA (abdominal aortic aneurysm) without rupture ([place]) 05/17/2014  ? COPD (chronic obstructive pulmonary disease) ([place]) 09/16/2013  ? TOBACCO USE 07/14/2008  ? Attention deficit disorder 05/13/2007  ? Essential hypertension 05/13/2007  ? ? ? ? ?HPI: Steven Hayden is a 71 y.o. male afib, htn/hlp, copd, former smoker, ascending thoracic aorta aneurysm, pulmonary GGO lesion RUL, here for a positive quantiferon gold testings ? ? ?Reviewed epic/notes/labs scanned into our epic ? ?Patient still smokes  ? ?He has been seen by pulmonary for the RUL ggo. He had initial chest ct 09/15/21 that showed a new 1.2 cm ggo within right upper lobe. He saw dr Valeta Harms on 10/13/21 who ordered a repeat ct of the chest which patient haven't yet done.  ? ? ?He has chronic itch >1 year and seeing dermatology. His dermatologist is about to start him on some biologics so the hepatitis/tb screen were done on 11/01/21. He has  some new medication before the itch occurs. He doesn't drink etoh much. No family hx liver disease. Father has melanoma ? ?He has no prior TB screening. ? ?Patient "gaining" weight like crazy. ?He denies chronic cough ? ?Once in a while he has profuse sweating not only at night but during the day and he would feel fine. This occurs about once a month ? ?No joint pain/back pain ? ?Born in [place], [place] ?No travel outside the country ?No hx homelessness, hanging out at the bars, incarcerated ? 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 ?He didn't grow up with anyone with tuberculosis ? ? ?He had colonoscopy years ago that was negative ?He said he never had prostate cancer screening ? ? ?-------------- ?12/20/21 id clinic f/u ?Patient doing well ?Not as itchy any more -- using steroid cream given by his dermatologist which calm it down ?He is feeling much better ?I have been discussing case with his pulmonologist dr Valeta Harms. We repeated ct chest recently and the right chest lesion is gone, and no sign concerning for primary or reactivating tb infection ?He continues to have no b sx or cough otherwise ?No weight loss ?Eating well ?He is on apixaban for afib ? ? ?Review of Systems: ?ROS ?Generalized pruritus chronic ? ? ? ? ? ? ?Past Medical History:  ?Diagnosis Date  ? AAA (abdominal aortic aneurysm)   ? ADD (attention deficit disorder)   ? Atrial fibrillation ([place])   ? Bipolar affective disorder ([place])   ? COPD (chronic  obstructive pulmonary disease) ([place])   ? Hyperlipidemia   ? Hypertension   ? Obesity (BMI 30-39.9) 04/15/2020  ? Prediabetes   ? Hgb A1c 6.4% August 2021  ? Schizophrenia ([place])   ? Statin declined 04/16/2020  ? States he will not take a statin. Has taken them in past.   ? Tobacco abuse   ? ? ?Social History  ? ?Tobacco Use  ? Smoking status: Some Days  ?  Packs/day: 2.00  ?  Years: 50.00  ?  Pack years: 100.00  ?  Types: Cigarettes  ? Smokeless tobacco: Never  ? Tobacco comments:  ?   currently smoking 1ppd as of 10/07/20  ?Substance Use Topics  ? Alcohol use: Yes  ?  Alcohol/week: 20.0 standard drinks  ?  Types: 20 Shots of liquor per week  ?  Comment: 3 shots every other day  ? Drug use: No  ? ? ?Family History  ?Problem Relation Age of Onset  ? Hypertension Brother   ? Hyperlipidemia Brother   ? Hypertension Father   ? Hyperlipidemia Father   ? Cancer Father   ? ? ?Allergies  ?Allergen Reactions  ? Codeine Phosphate   ?  REACTION: swelling of uvula, hives  ? Dilaudid [Hydromorphone]   ? ? ?OBJECTIVE: ?Vitals:  ? 12/20/21 1128  ?BP: 128/83  ?Pulse: 71  ?Resp: 16  ?SpO2: 100%  ?Weight: 281 lb (127.5 kg)  ?Height: 6' (1.829 m)  ? ?Body mass index is 38.11 kg/m?. ? ? ?Physical Exam ? ?General/constitutional: no distress, pleasant ?HEENT: Normocephalic, PER, Conj Clear, EOMI, Oropharynx clear ?Neck supple ?CV: rrr no mrg ?Lungs: clear to auscultation, normal respiratory effort ?Abd: Soft, Nontender ?Ext: no edema ?Skin: dry scaly skin; no erythroderma ?Neuro: nonfocal ?MSK: no peripheral joint swelling/tenderness/warmth; back spines nontender ? ? ? ? ? ?Lab: ?Lab Results  ?Component Value Date  ? WBC 7.7 04/15/2020  ? HGB 16.6 04/15/2020  ? HCT 51.0 04/15/2020  ? MCV 96 04/15/2020  ? PLT 189 04/15/2020  ? ?Last metabolic panel ?Lab Results  ?Component Value Date  ? GLUCOSE 127 (H) 04/15/2020  ? NA 142 04/15/2020  ? K 4.2 04/15/2020  ? CL 99 04/15/2020  ? CO2 30 (H) 04/15/2020  ? BUN 19 04/15/2020  ? CREATININE 1.20 09/15/2021  ? GFRNONAA 69 04/15/2020  ? CALCIUM 9.3 04/15/2020  ? PROT 7.0 04/15/2020  ? ALBUMIN 4.3 04/15/2020  ? LABGLOB 2.7 04/15/2020  ? AGRATIO 1.6 04/15/2020  ? BILITOT 0.2 04/15/2020  ? ALKPHOS 70 04/15/2020  ? AST 24 04/15/2020  ? ALT 38 04/15/2020  ? ANIONGAP 12 05/13/2018  ? ? 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 ?Serology: ? ?Imaging: ?3/30 chest ct ?1. Interval resolution of previously seen  right upper lobe ?ground-glass nodule. Persistent bronchial wall thickening in the ?right lower lobe consistent with chronic bronchitis. Progressive ?increase of atelectasis in the right lower lobe. Unchanged ?atelectasis of the right upper lobe. Findings may be related to ?chronic aspiration. Please correlate with patient's symptoms. ?2. Aneurysmal dilatation of the ascending thoracic aorta measuring ?up to 4.5 x 4.3 cm. Ascending thoracic aortic aneurysm. Recommend ?semi-annual imaging followup by CTA or MRA and referral to ?cardiothoracic surgery if not already obtained. This recommendation ?follows 2010 ACCF/AHA/AATS/ACR/ASA/SCA/SCAI/SIR/STS/SVM Guidelines ?for the Diagnosis and Management of Patients With Thoracic Aortic ?Disease ? ?Assessment/plan: ?Problem List Items Addressed This Visit   ?None ? ?We reviewed the natural history  of tb exposure and difference between latent/active tb ? ?We reviewed risk for latent tb to become active. He'll be on biologic for his pruritic/eczema condition ? ?I wonder what the imaging of his chest RUL ggo means. I am not sure when he contracted/seroconverted the TB screen ? ?While adults in [place] TB is usually reactivation, I query if the lung lesion could be primary tb pna and the quantiferon gold represent recent seroconversion. He doesn't appear to have ghon focus/complex at this time but these usually represent chronic changes of tb rather than initial ? ?Dr Valeta Harms  had planned a 6 months repeat ct scan for his but I will ask him to comment on possibility of recent tb exposure ? ?I would like to sort this out in the next few weeks so I can treat him appropriately for whether latent tb or active tb ? ?12/20/21 assessment ?Repeat chest ct showed resolved rul chest lesion; no ghon focus/complex ?Discussed case with dr Valeta Harms and we feel comfortable no sign of active tb ? ?Due to him taking apixaban, will do inh instead of rifampin ? ? ?-b6 50 mg and inh for 6 months  treatment ?-f/u in 4-6 weeks for repeat liver function testing ?-ok to start immunosuppressant for skin condition 6-8 weeks after starting inh ? ? ? ? ? ? ?Follow-up: Return in about 4 weeks (around 01/17/2022). ? ?[place]

## 2022-01-23 ENCOUNTER — Telehealth: Payer: Self-pay | Admitting: Pulmonary Disease

## 2022-01-23 NOTE — Telephone Encounter (Signed)
Noted and I have closed the order

## 2022-01-23 NOTE — Telephone Encounter (Signed)
Patient just had CT wo contrast in 11/2021. Does the patient still need the CT that was ordered by Dr Valeta Harms for July 2023.  Dr Valeta Harms please advise

## 2022-01-23 NOTE — Telephone Encounter (Signed)
Per Dr Valeta Harms patient does not need scan that was ordered for July 2023.   He can follow up with ID.

## 2022-03-03 ENCOUNTER — Encounter: Payer: Self-pay | Admitting: Internal Medicine

## 2022-03-14 ENCOUNTER — Ambulatory Visit: Payer: Medicare HMO

## 2022-03-20 NOTE — Progress Notes (Deleted)
[address]       [place] [address]             [phone]       HPI: Steven Hayden is a 71 year old gentleman with chronic obstructive pulmonary disease, ongoing tobacco use with a 100-pack-year smoking history, hypertension, dyslipidemia, chronic kidney disease, and paroxysmal atrial fibrillation.  He has a history of a 10 cm abdominal aortic aneurysm that was repaired with transaortic endovascular stenting in 2015.  He has been followed by our practice for surveillance of a 4.5 cm ascending aortic aneurysm.  He was last seen by Ms. Ellwood Handler, PA-C in January of this year.  CT scan of the chest showed a stable 4.4 cm ascending aortic aneurysm.  The CT, however, also demonstrated a 1.2 cm ground glass opacity in the right upper lobe.  After discussion with Dr. Kipp Brood, the patient was referred to Dr. June Leap for further evaluation.  Repeat imaging obtained about 3 months later showed this lesion to have resolved.  In the interim, however, the patient had a QuantiFERON gold test conducted by his dermatologist prior to starting medication for chronic itching.  The QuantiFERON gold was positive.  This prompted referral to the infectious disease service.  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 Steven Hayden today for scheduled follow-up of his ascending aortic aneurysm.    Current Outpatient Medications  Medication Sig Dispense Refill   albuterol (VENTOLIN HFA) 108 (90 Base) MCG/ACT inhaler Inhale 2 puffs into the lungs every 6 (six) hours as needed for wheezing or shortness of breath. 18 g 2   amphetamine-dextroamphetamine (ADDERALL) 30 MG tablet Take 1 tablet by mouth daily. (Patient not taking: Reported on 11/15/2021) 30 tablet 0   apixaban (ELIQUIS) 5 MG TABS tablet Take 1 tablet (5 mg total) by mouth 2 (two) times daily. 180 tablet 2   busPIRone (BUSPAR) 5 MG tablet Take 1  tablet (5 mg total) by mouth 2 (two) times daily. 60 tablet 5   cholecalciferol (VITAMIN D3) 25 MCG (1000 UNIT) tablet Take 1 tablet (1,000 Units total) by mouth daily. 1 tablet 90 tablet 3   Dermatological Products, Misc. ([facility] lotion apply to body daily for maintenance     diphenhydrAMINE HCl (BENADRYL ALLERGY PO) Take by mouth.     Doxylamine Succinate, Sleep, (UNISOM PO) 2 capsules     DUPIXENT 300 MG/2ML SOPN SMARTSIG:1 pre-filled pen syringe SUB-Q Every 2 Weeks (Patient not taking: Reported on 11/15/2021)     Fluticasone-Umeclidin-Vilant (TRELEGY ELLIPTA) 200-62.5-25 MCG/ACT AEPB Inhale 1 puff into the lungs daily. 60 each 3   hydrOXYzine (ATARAX) 10 MG tablet take 1-2 pills po nightly for itching 90 tablet 5   isoniazid (NYDRAZID) 300 MG tablet Take 1 tablet (300 mg total) by mouth daily. 90 tablet 1   ketoconazole (NIZORAL) 2 % cream      lisinopril (ZESTRIL) 20 MG tablet Take 1 tablet (20 mg total) by mouth daily. 90 tablet 3   MAGNESIUM MALATE PO 4050 mg     metoprolol succinate (TOPROL XL) 25 MG 24 hr tablet Take 1 tablet (25 mg total) by mouth daily. 90 tablet 3   predniSONE (DELTASONE) 10 MG tablet Take 2 tablets (20 mg total) by mouth daily. (Patient not taking: Reported on 10/13/2021) 20 tablet 0   predniSONE (DELTASONE) 20 MG tablet  (Patient not  taking: Reported on 11/18/2021)     pyridOXINE (VITAMIN B-6) 50 MG tablet Take 1 tablet (50 mg total) by mouth daily. 90 tablet 1   rosuvastatin (CRESTOR) 5 MG tablet Take 1 tablet (5 mg total) by mouth every other day. 90 tablet 1   triamcinolone ointment (KENALOG) 0.1 %      Turmeric (QC TUMERIC COMPLEX PO) Take by mouth. (Patient not taking: Reported on 11/18/2021)     Zinc 50 MG CAPS Take by mouth.     No current facility-administered medications for this visit.    Physical Exam  Diagnostic Tests:   Impression:  Plan:   Antony Odea, PA-C [facility] [phone]

## 2022-03-21 ENCOUNTER — Ambulatory Visit: Payer: Medicare HMO

## 2022-03-21 ENCOUNTER — Inpatient Hospital Stay: Admission: RE | Admit: 2022-03-21 | Payer: Medicare HMO | Source: Ambulatory Visit

## 2022-03-21 ENCOUNTER — Other Ambulatory Visit: Payer: Medicare HMO

## 2022-03-23 ENCOUNTER — Encounter: Payer: Self-pay | Admitting: Physician Assistant

## 2022-03-23 ENCOUNTER — Ambulatory Visit: Payer: Medicare HMO | Admitting: Physician Assistant

## 2022-03-23 ENCOUNTER — Ambulatory Visit
Admission: RE | Admit: 2022-03-23 | Discharge: 2022-03-23 | Disposition: A | Discharge status: Home / Self Care | Payer: Medicare HMO | Source: Ambulatory Visit | Attending: Physician Assistant | Admitting: Physician Assistant

## 2022-03-23 VITALS — BP 135/82 | HR 72 | Resp 20 | Ht 72.0 in | Wt 280.0 lb

## 2022-03-23 DIAGNOSIS — I4891 Unspecified atrial fibrillation: Secondary | ICD-10-CM | POA: Diagnosis not present

## 2022-03-23 DIAGNOSIS — J8410 Pulmonary fibrosis, unspecified: Secondary | ICD-10-CM | POA: Diagnosis not present

## 2022-03-23 DIAGNOSIS — J42 Unspecified chronic bronchitis: Secondary | ICD-10-CM | POA: Diagnosis not present

## 2022-03-23 DIAGNOSIS — I2510 Atherosclerotic heart disease of native coronary artery without angina pectoris: Secondary | ICD-10-CM | POA: Diagnosis not present

## 2022-03-23 DIAGNOSIS — I712 Thoracic aortic aneurysm, without rupture, unspecified: Secondary | ICD-10-CM

## 2022-03-23 MED ORDER — IOPAMIDOL (ISOVUE-370) INJECTION 76%
75.0000 mL | Freq: Once | INTRAVENOUS | Status: AC | PRN
  Administered 2022-03-23: 75 mL via INTRAVENOUS

## 2022-03-23 NOTE — Patient Instructions (Signed)
Continue diligent management of your blood pressure  Avoid strenuous activities or lifting greater than 40 pounds.  Follow-up in 6 months with repeat CTA chest

## 2022-03-23 NOTE — Progress Notes (Signed)
[address]       [place] [address]             [phone]       HPI:  Steven Hayden is a 71 yo male with history of COPD, HTN, TAA 10 cm S/P EVAR by Dr. Bridgett Larsson in 2015, HLD,  Atrial Fibrillation, CKD, Prediabetes, Nicotine abuse and obesity.   He was discovered to have an ascending aortic aneurysm a few years ago subsequently referred to our practice for surveillance and follow up.  At his last visit in January of this year, he was noted to have a 1.2 cm right upper lobe groundglass opacity.  He was referred to Dr. Leory Plowman [facility] for further evaluation and follow-up of this lesion.  Dr. Valeta Harms felt that since the patient was asymptomatic and that most of these lesions resolve on their own, this groundglass opacity should just be watched with repeat CT scan in 6 months.  A few weeks after that visit with Dr. Valeta Harms, the patient was being seen by his dermatologist for management of a chronic itch that had been ongoing for greater than a year.  Hepatitis and TB screening labs were ordered in anticipation of initiating an immunosuppressant agent for management of the chronic skin condition.  QuantiFERON gold returned positive.  Steven Hayden was then referred to the infectious disease service for evaluation given these results.  CT scan of the chest was repeated in late March of this year showing groundglass opacity had resolved.  The thoracic aortic aneurysm was stable at 4.5 cm.  After conferring with Dr. Valeta Harms, Dr. Gale Journey decided to initiate treatment with INH for 6 months for suspected latent TB.   Steven Hayden had an echocardiogram in 2020 showing ejection fraction of 55%.  The aortic valve was trileaflet with no aortic stenosis or insufficiency Steven Hayden denies having any chest pain or back pain.    Current Outpatient Medications  Medication Sig Dispense Refill   albuterol (VENTOLIN HFA) 108 (90 Base) MCG/ACT inhaler Inhale 2 puffs into the lungs every 6 (six) hours as needed for  wheezing or shortness of breath. 18 g 2   amphetamine-dextroamphetamine (ADDERALL) 30 MG tablet Take 1 tablet by mouth daily. (Patient not taking: Reported on 11/15/2021) 30 tablet 0   apixaban (ELIQUIS) 5 MG TABS tablet Take 1 tablet (5 mg total) by mouth 2 (two) times daily. 180 tablet 2   busPIRone (BUSPAR) 5 MG tablet Take 1 tablet (5 mg total) by mouth 2 (two) times daily. 60 tablet 5   cholecalciferol (VITAMIN D3) 25 MCG (1000 UNIT) tablet Take 1 tablet (1,000 Units total) by mouth daily. 1 tablet 90 tablet 3   Dermatological Products, Misc. [place]) lotion apply to body daily for maintenance     diphenhydrAMINE HCl (BENADRYL ALLERGY PO) Take by mouth.     Doxylamine Succinate, Sleep, (UNISOM PO) 2 capsules     DUPIXENT 300 MG/2ML SOPN SMARTSIG:1 pre-filled pen syringe SUB-Q Every 2 Weeks (Patient not taking: Reported on 11/15/2021)     Fluticasone-Umeclidin-Vilant (TRELEGY ELLIPTA) 200-62.5-25 MCG/ACT AEPB Inhale 1 puff into the lungs daily. 60 each 3   hydrOXYzine (ATARAX) 10 MG tablet take 1-2 pills po nightly for itching 90 tablet 5   isoniazid (NYDRAZID) 300 MG tablet Take 1 tablet (300 mg total) by mouth daily. 90 tablet 1   ketoconazole (NIZORAL) 2 % cream      lisinopril (ZESTRIL) 20 MG tablet Take 1 tablet (20 mg  total) by mouth daily. 90 tablet 3   MAGNESIUM MALATE PO 4050 mg     metoprolol succinate (TOPROL XL) 25 MG 24 hr tablet Take 1 tablet (25 mg total) by mouth daily. 90 tablet 3   predniSONE (DELTASONE) 10 MG tablet Take 2 tablets (20 mg total) by mouth daily. (Patient not taking: Reported on 10/13/2021) 20 tablet 0   predniSONE (DELTASONE) 20 MG tablet  (Patient not taking: Reported on 11/18/2021)     pyridOXINE (VITAMIN B-6) 50 MG tablet Take 1 tablet (50 mg total) by mouth daily. 90 tablet 1   rosuvastatin (CRESTOR) 5 MG tablet Take 1 tablet (5 mg total) by mouth every other day. 90 tablet 1   triamcinolone ointment (KENALOG) 0.1 %      Turmeric (QC TUMERIC COMPLEX PO)  Take by mouth. (Patient not taking: Reported on 11/18/2021)     Zinc 50 MG CAPS Take by mouth.     No current facility-administered medications for this visit.    Physical Exam Vital signs BP 135/82 Respirations 20 Pulse 72 SPO2 90% on room air  General: Steven Hayden appears well, he is in no acute distress.   Heart: Regular rate and rhythm Chest: Breath sounds are coarse Extremities: No edema, all well perfused.  Diagnostic Tests:  CLINICAL DATA:  Follow-up ascending aortic aneurysm   EXAM: CT ANGIOGRAPHY CHEST WITH CONTRAST   TECHNIQUE: Multidetector CT imaging of the chest was performed using the standard protocol during bolus administration of intravenous contrast. Multiplanar CT image reconstructions and MIPs were obtained to evaluate the vascular anatomy.   RADIATION DOSE REDUCTION: This exam was performed according to the departmental dose-optimization program which includes automated exposure control, adjustment of the mA and/or kV according to patient size and/or use of iterative reconstruction technique.   CONTRAST:  67m ISOVUE-370 IOPAMIDOL (ISOVUE-370) INJECTION 76%   COMPARISON:  CT chest without contrast 12/01/2021 and CTA chest 09/15/2021   FINDINGS: Cardiovascular: Ascending aortic aneurysm measuring approximately 4.6 x 4.3 cm, previously 4.5 by 4.3 cm. Normal pulmonary arteries. Aortic and coronary artery atherosclerotic calcification.   Mediastinum/Nodes: No enlarged mediastinal, hilar, or axillary lymph nodes. Thyroid gland, trachea, and esophagus demonstrate no significant findings.   Lungs/Pleura: Scattered areas of subsegmental atelectasis/scarring greatest in the lung bases. No pleural effusion or pneumothorax. Similar diffuse bronchial wall thickening greatest in the lower lobe with scattered mucus plugs. Calcified granuloma in the lingula.   Upper Abdomen: No acute abnormality.   Musculoskeletal: No chest wall abnormality. No acute or  significant osseous findings.   Review of the MIP images confirms the above findings.   IMPRESSION: 1. Slight interval increase in ascending aortic aneurysm measuring 4.6 cm in greatest diameter. Recommend follow-up CT/MR every 6 months and vascular consultation. This recommendation follows ACR consensus guidelines: White Paper of the ACR Incidental Findings Committee II on Vascular Findings. J Am Coll Radiol 2013; 10:789-794. 2. Similar findings of chronic bronchitis greatest in the lower lobes. 3.  Aortic Atherosclerosis (ICD10-I70.0).     Electronically Signed   By: TPlacido SouM.D.   On: 03/23/2022 13:32  Impression / Plan: 71year old male with a history of COPD, HTN, TAA 10 cm S/P EVAR by Dr. CBridgett Larssonin 2015, HLD,  Atrial Fibrillation, CKD, Prediabetes, Nicotine abuse and obesity is being followed for thoracic aortic aneurysm.  The lesion has expanded slightly since the last study 6 months ago from 4.5 to 4.6 cm.  It was 4.4 cm when first diagnosed in May 2021.  Blood  pressure appears to be reasonably controlled on lisinopril and metoprolol.  He takes his rosuvastatin as prescribed. The groundglass lesion in the right upper lobe has resolved.  He has been taking INH since April for suspected latent TB.  No intervention is indicated at this time in regards to the thoracic aneurysm.  We again discussed the importance of careful blood pressure management and lipid control.  He follows the previously discussed guidelines of avoiding strenuous activity.  He agrees to follow-up with [place] in 6 months with CTA chest per recommendations  I spent over 20 minutes reviewing the medical record, relevant imaging, and in face-to-face discussion with Steven Hayden regarding his thoracic aortic aneurysm.  Antony Odea, PA-C [facility] [phone]

## 2022-03-27 DIAGNOSIS — L503 Dermatographic urticaria: Secondary | ICD-10-CM | POA: Diagnosis not present

## 2022-03-27 DIAGNOSIS — L2089 Other atopic dermatitis: Secondary | ICD-10-CM | POA: Diagnosis not present

## 2022-04-02 DIAGNOSIS — Z452 Encounter for adjustment and management of vascular access device: Secondary | ICD-10-CM | POA: Diagnosis not present

## 2022-04-02 DIAGNOSIS — N179 Acute kidney failure, unspecified: Secondary | ICD-10-CM | POA: Diagnosis not present

## 2022-04-02 DIAGNOSIS — E669 Obesity, unspecified: Secondary | ICD-10-CM | POA: Diagnosis not present

## 2022-04-02 DIAGNOSIS — J9622 Acute and chronic respiratory failure with hypercapnia: Secondary | ICD-10-CM | POA: Diagnosis not present

## 2022-04-02 DIAGNOSIS — I714 Abdominal aortic aneurysm, without rupture, unspecified: Secondary | ICD-10-CM | POA: Diagnosis not present

## 2022-04-02 DIAGNOSIS — F17290 Nicotine dependence, other tobacco product, uncomplicated: Secondary | ICD-10-CM | POA: Diagnosis not present

## 2022-04-02 DIAGNOSIS — J441 Chronic obstructive pulmonary disease with (acute) exacerbation: Secondary | ICD-10-CM | POA: Diagnosis not present

## 2022-04-02 DIAGNOSIS — K4090 Unilateral inguinal hernia, without obstruction or gangrene, not specified as recurrent: Secondary | ICD-10-CM | POA: Diagnosis not present

## 2022-04-02 DIAGNOSIS — I517 Cardiomegaly: Secondary | ICD-10-CM | POA: Diagnosis not present

## 2022-04-02 DIAGNOSIS — R0902 Hypoxemia: Secondary | ICD-10-CM | POA: Diagnosis not present

## 2022-04-02 DIAGNOSIS — J9811 Atelectasis: Secondary | ICD-10-CM | POA: Diagnosis not present

## 2022-04-02 DIAGNOSIS — N201 Calculus of ureter: Secondary | ICD-10-CM | POA: Diagnosis not present

## 2022-04-02 DIAGNOSIS — I2510 Atherosclerotic heart disease of native coronary artery without angina pectoris: Secondary | ICD-10-CM | POA: Diagnosis not present

## 2022-04-02 DIAGNOSIS — J9621 Acute and chronic respiratory failure with hypoxia: Secondary | ICD-10-CM | POA: Diagnosis not present

## 2022-04-02 DIAGNOSIS — I10 Essential (primary) hypertension: Secondary | ICD-10-CM | POA: Diagnosis not present

## 2022-04-02 DIAGNOSIS — K429 Umbilical hernia without obstruction or gangrene: Secondary | ICD-10-CM | POA: Diagnosis not present

## 2022-04-02 DIAGNOSIS — E785 Hyperlipidemia, unspecified: Secondary | ICD-10-CM | POA: Diagnosis not present

## 2022-04-02 DIAGNOSIS — Z6838 Body mass index (BMI) 38.0-38.9, adult: Secondary | ICD-10-CM | POA: Diagnosis not present

## 2022-04-02 DIAGNOSIS — R0602 Shortness of breath: Secondary | ICD-10-CM | POA: Diagnosis not present

## 2022-04-02 DIAGNOSIS — E7849 Other hyperlipidemia: Secondary | ICD-10-CM | POA: Diagnosis not present

## 2022-04-02 DIAGNOSIS — F17200 Nicotine dependence, unspecified, uncomplicated: Secondary | ICD-10-CM | POA: Diagnosis not present

## 2022-04-02 DIAGNOSIS — N200 Calculus of kidney: Secondary | ICD-10-CM | POA: Diagnosis not present

## 2022-04-02 DIAGNOSIS — R109 Unspecified abdominal pain: Secondary | ICD-10-CM | POA: Diagnosis not present

## 2022-04-02 DIAGNOSIS — N132 Hydronephrosis with renal and ureteral calculous obstruction: Secondary | ICD-10-CM | POA: Diagnosis not present

## 2022-04-03 DIAGNOSIS — J441 Chronic obstructive pulmonary disease with (acute) exacerbation: Secondary | ICD-10-CM | POA: Diagnosis not present

## 2022-04-03 DIAGNOSIS — N200 Calculus of kidney: Secondary | ICD-10-CM | POA: Diagnosis not present

## 2022-04-03 DIAGNOSIS — I2510 Atherosclerotic heart disease of native coronary artery without angina pectoris: Secondary | ICD-10-CM | POA: Diagnosis not present

## 2022-04-03 DIAGNOSIS — I10 Essential (primary) hypertension: Secondary | ICD-10-CM | POA: Diagnosis not present

## 2022-04-04 DIAGNOSIS — N200 Calculus of kidney: Secondary | ICD-10-CM | POA: Diagnosis not present

## 2022-04-04 DIAGNOSIS — I2510 Atherosclerotic heart disease of native coronary artery without angina pectoris: Secondary | ICD-10-CM | POA: Diagnosis not present

## 2022-04-04 DIAGNOSIS — I10 Essential (primary) hypertension: Secondary | ICD-10-CM | POA: Diagnosis not present

## 2022-04-04 DIAGNOSIS — J441 Chronic obstructive pulmonary disease with (acute) exacerbation: Secondary | ICD-10-CM | POA: Diagnosis not present

## 2022-04-05 DIAGNOSIS — J441 Chronic obstructive pulmonary disease with (acute) exacerbation: Secondary | ICD-10-CM | POA: Diagnosis not present

## 2022-04-05 DIAGNOSIS — N200 Calculus of kidney: Secondary | ICD-10-CM | POA: Diagnosis not present

## 2022-04-06 ENCOUNTER — Other Ambulatory Visit: Payer: Self-pay | Admitting: *Deleted

## 2022-04-06 NOTE — Patient Outreach (Signed)
  Care Coordination TOC Note Transition Care Management Follow-up Telephone Call Date of discharge and from where:  [facility] [phone]  How have you been since you were released from the hospital? I got up up and my oxygen saturation was 89.  I put the oxygen on and it was 91.  Any questions or concerns? YesDo I need to keep this oxygen on all the time. RN explained the O2 Saturation needs to be above 90. Yes he needs to  keep it on if it is below .  Items Reviewed: Did the pt receive and understand the discharge instructions provided? Yes  Medications obtained and verified? No  Patient stated he is going to get medications today. RN asked could he afford them or need assistance from pharmacy. Patient declined at this time.   Other? No  Any new allergies since your discharge? No  Dietary orders reviewed? No Do you have support at home? Yes   Home Care and Equipment/Supplies: Were home health services ordered? no If so, what is the name of the agency? N   Has the agency set up a time to come to the patient's home? not applicable Were any new equipment or medical supplies ordered?  Yes: OXYGEN What is the name of the medical supply agency? [facility] Were you able to get the supplies/equipment? yes Do you have any questions related to the use of the equipment or supplies? Yes:    Functional Questionnaire: (I = Independent and D = Dependent) ADLs: I  Bathing/Dressing- I  Meal Prep- I  Eating- I  Maintaining continence- I  Transferring/Ambulation- I  Managing Meds- I  Follow up appointments reviewed:  PCP Hospital f/u appt confirmed? Yes  Scheduled to see Shanon Payor Pa on [phone] at [facility] f/u appt confirmed? No  . Are transportation arrangements needed? No  If their condition worsens, is the pt aware to call PCP or go to the Emergency Dept.? Yes Was the patient provided with contact information for the PCP's office or ED? Yes Was to pt encouraged to call  back with questions or concerns? Yes  SDOH assessments and interventions completed:   Yes  Care Coordination Interventions Activated:  y   Care Coordination Interventions:   none needed     Encounter Outcome:  Pt. Visit Completed    [facility] [phone]

## 2022-04-16 ENCOUNTER — Other Ambulatory Visit: Payer: Self-pay | Admitting: Medical

## 2022-04-17 NOTE — Telephone Encounter (Signed)
Seems like encounter was open in error so closing encounter.  

## 2022-05-10 ENCOUNTER — Encounter: Payer: Self-pay | Admitting: Internal Medicine

## 2022-05-19 ENCOUNTER — Encounter: Payer: Medicare HMO | Admitting: Physician Assistant

## 2022-06-12 DIAGNOSIS — H353131 Nonexudative age-related macular degeneration, bilateral, early dry stage: Secondary | ICD-10-CM | POA: Diagnosis not present

## 2022-06-12 DIAGNOSIS — H2513 Age-related nuclear cataract, bilateral: Secondary | ICD-10-CM | POA: Diagnosis not present

## 2022-06-12 DIAGNOSIS — H2512 Age-related nuclear cataract, left eye: Secondary | ICD-10-CM | POA: Diagnosis not present

## 2022-06-13 ENCOUNTER — Encounter: Payer: Self-pay | Admitting: Internal Medicine

## 2022-06-26 ENCOUNTER — Other Ambulatory Visit: Payer: Self-pay | Admitting: Internal Medicine

## 2022-06-27 ENCOUNTER — Telehealth: Payer: Self-pay | Admitting: Pulmonary Disease

## 2022-06-27 NOTE — Telephone Encounter (Signed)
I do not have a open order for a ct last one I see was due in July but per Dr Valeta Harms was ok for me to cancel order see phone note from 01/23/2022 can someone call patient and let him know its not needed unless there is a new one that needs to be ordered

## 2022-06-27 NOTE — Telephone Encounter (Signed)
Called patient and left voicemail for him to call office back to go over information from [facility] about him not needing Ct scan that was ordered in July.

## 2022-06-28 NOTE — Telephone Encounter (Signed)
Pt called office back. Relayed to him the info from [place] and also from the phone note from 5/22. Pt said last time he had a scan done, he was told that he had TB and pt was then prescribed meds to treat the TB. Pt was on meds for 6 months which he stated he finished in September 2023.  With the CT being cancelled, pt wanted to know how we would know if the TB is fully cleared up. Dr. Valeta Harms, please advise on this for pt.

## 2022-07-09 ENCOUNTER — Other Ambulatory Visit: Payer: Self-pay | Admitting: Internal Medicine

## 2022-07-15 ENCOUNTER — Other Ambulatory Visit: Payer: Self-pay | Admitting: Physician Assistant

## 2022-07-15 DIAGNOSIS — F419 Anxiety disorder, unspecified: Secondary | ICD-10-CM

## 2022-07-18 ENCOUNTER — Encounter: Payer: Self-pay | Admitting: Internal Medicine

## 2022-07-24 NOTE — Telephone Encounter (Signed)
Steven Nash, DO  Pinion, Waldemar Dickens, CMA; [facility] Triage Pool; Joella Prince, RN; Christie Beckers, RN2 days ago    He should have follow up with Dr. Gale Journey from [facility].  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 Please place a referral for LDCT program.  Thanks  BLI     ATC LVMTCB X1

## 2022-07-25 ENCOUNTER — Ambulatory Visit (INDEPENDENT_AMBULATORY_CARE_PROVIDER_SITE_OTHER): Payer: Medicare HMO | Admitting: Nurse Practitioner

## 2022-07-25 ENCOUNTER — Encounter: Payer: Self-pay | Admitting: Nurse Practitioner

## 2022-07-25 VITALS — BP 130/80 | HR 65 | Wt 283.2 lb

## 2022-07-25 DIAGNOSIS — J449 Chronic obstructive pulmonary disease, unspecified: Secondary | ICD-10-CM | POA: Diagnosis not present

## 2022-07-25 DIAGNOSIS — E782 Mixed hyperlipidemia: Secondary | ICD-10-CM

## 2022-07-25 DIAGNOSIS — I480 Paroxysmal atrial fibrillation: Secondary | ICD-10-CM | POA: Diagnosis not present

## 2022-07-25 DIAGNOSIS — L299 Pruritus, unspecified: Secondary | ICD-10-CM

## 2022-07-25 DIAGNOSIS — N189 Chronic kidney disease, unspecified: Secondary | ICD-10-CM

## 2022-07-25 DIAGNOSIS — I714 Abdominal aortic aneurysm, without rupture, unspecified: Secondary | ICD-10-CM | POA: Diagnosis not present

## 2022-07-25 DIAGNOSIS — R7303 Prediabetes: Secondary | ICD-10-CM | POA: Diagnosis not present

## 2022-07-25 DIAGNOSIS — I10 Essential (primary) hypertension: Secondary | ICD-10-CM

## 2022-07-25 DIAGNOSIS — L508 Other urticaria: Secondary | ICD-10-CM

## 2022-07-25 DIAGNOSIS — F988 Other specified behavioral and emotional disorders with onset usually occurring in childhood and adolescence: Secondary | ICD-10-CM | POA: Diagnosis not present

## 2022-07-25 DIAGNOSIS — F419 Anxiety disorder, unspecified: Secondary | ICD-10-CM | POA: Diagnosis not present

## 2022-07-25 DIAGNOSIS — E559 Vitamin D deficiency, unspecified: Secondary | ICD-10-CM | POA: Diagnosis not present

## 2022-07-25 MED ORDER — TRIAMCINOLONE ACETONIDE 0.1 % EX OINT: TOPICAL_OINTMENT | CUTANEOUS | 2 refills | Status: DC

## 2022-07-25 MED ORDER — FLUOXETINE HCL 20 MG PO TABS: ORAL_TABLET | ORAL | 1 refills | Status: DC

## 2022-07-25 NOTE — Assessment & Plan Note (Signed)
Chronic. Labs pending today. Will monitor closely and ensure that medication dosages are appropriate for kidney function.

## 2022-07-25 NOTE — Assessment & Plan Note (Signed)
Chronic. On appropriate therapy. Labs pending today.

## 2022-07-25 NOTE — Assessment & Plan Note (Signed)
>>  ASSESSMENT AND PLAN FOR AAA (ABDOMINAL AORTIC ANEURYSM) WITHOUT RUPTURE (HCC) WRITTEN ON 07/25/2022 10:00 AM BY Isabel Freese E, NP  Chronic. No alarm sx present at this time. Discussed with patient the risks associated with restarting adderall and the increase this could cause in HR and BP. At this time I do not recommend this medication for ADHD treatment. Continue to monitor closely.

## 2022-07-25 NOTE — Telephone Encounter (Signed)
ATC LVMTCB x 1  

## 2022-07-25 NOTE — Assessment & Plan Note (Signed)
Chronic. Well controlled at this time. No alarm sx present. Recommend continue current therapy and close monitoring with pulmonology.

## 2022-07-25 NOTE — Assessment & Plan Note (Signed)
Chronic. Currently on buspar with no reported improvement of symptoms. Recommend start of fluoxetine to see if this helps with symptom management. I do feel that anxiety and possible obsessive behaviors are contributing to many of his concerns, therefore, I am hopeful that this will make a significant improvement in his mood and overall health. We will plan to follow closely.

## 2022-07-25 NOTE — Assessment & Plan Note (Addendum)
Etiology unknown. Appears that initial onset related to possible allergic response to memory foam pillow, however, continued exacerbation cause unclear. Unfortunately dermatology has not been able to find a cause. It is possible that this is neurologic or psychogenic in nature. There is an area of mild lichenification on the lower back area, likely from chronic scratching. Given patients history of anxiety, I do feel that a trial of SSRI is warranted to see if this is helpful. Discussion with patient about medication and he is in agreement to try. If not effective, we may consider trial of gabapentin at bedtime, but will need to also D/C hydroxyzine to prevent increased sedation. Possibly the combination of fluoxetine and gabapentin would be helpful for management of his symptoms. We will monitor effects of fluoxetine alone in 3 months and consider alternatives for this as needed.

## 2022-07-25 NOTE — Assessment & Plan Note (Signed)
Chronic. No alarm sx present at this time. Discussed with patient the risks associated with restarting adderall and the increase this could cause in HR and BP. At this time I do not recommend this medication for ADHD treatment. Continue to monitor closely.

## 2022-07-25 NOTE — Assessment & Plan Note (Signed)
Chronic. Medication stopped, likely due to increased cardiovascular risks including atrial fibrillation and AAA. At this time I do not recommend stimulant medication due to the increased risks with exacerbating these conditions as well as increased BP risks. Patients primary concerns appear to be centered around energy levels, therefore I am hopeful that the start of fluoxetine will help with this and in turn relieve some of the symptoms he is experiencing. Will monitor closely for changes.

## 2022-07-25 NOTE — Assessment & Plan Note (Signed)
Chronic. On chronic anticoagulation. No signs of bleeding present. HRRR today. No alarm sx present at this time. Will monitor labs today. Continue current medications. Patient does have concerns with metoprolol due to side effect of itching listed- encouraged him to discuss with cardiology, although itching started prior to start of this medication so this is very unlikely a contributor. We will plan to monitor closely.

## 2022-07-25 NOTE — Assessment & Plan Note (Signed)
Chronic. Last A1c in July/August. Diet controlled only. Will monitor A1c today and make changes to plan of care as necessary based on findings.

## 2022-07-25 NOTE — Assessment & Plan Note (Addendum)
>>  ASSESSMENT AND PLAN FOR CHRONIC PRURITUS WRITTEN ON 07/25/2022 10:07 AM BY Primitivo Merkey E, NP  See chronic urticaria.   >>ASSESSMENT AND PLAN FOR CHRONIC URTICARIA WRITTEN ON 07/25/2022 10:11 AM BY Dustan Hyams E, NP  Etiology unknown. Appears that initial onset related to possible allergic response to memory foam pillow, however, continued exacerbation cause unclear. Unfortunately dermatology has not been able to find a cause. It is possible that this is neurologic or psychogenic in nature. There is an area of mild lichenification on the lower back area, likely from chronic scratching. Given patients history of anxiety, I do feel that a trial of SSRI is warranted to see if this is helpful. Discussion with patient about medication and he is in agreement to try. If not effective, we may consider trial of gabapentin at bedtime, but will need to also D/C hydroxyzine to prevent increased sedation. Possibly the combination of fluoxetine and gabapentin would be helpful for management of his symptoms. We will monitor effects of fluoxetine alone in 3 months and consider alternatives for this as needed.

## 2022-07-25 NOTE — Assessment & Plan Note (Signed)
Chronic. BP 130/80 today, which is at goal. No alarm sx present. Labs pending. Continue current medications. F/U in 3 months or sooner if needed.

## 2022-07-25 NOTE — Patient Instructions (Addendum)
Itching on lower back: I would like you to mix about 1/2- 1 inch of the triamcinolone cream with a heavy skin cream, like Eucerin cream (the generic version is just fine) and spread over the area on your lower back one time a day for 5 days then stop. You can repeat this once a month if the itching comes back.   General Itching: I have sent in a medication called Fluoxetine that can help with the itching and energy. It is also helpful for mood. You take this once a day in the morning. This can take about 6 full weeks to be completely effective.   For your hands: Use the Eucerin cream on your hands every night before bed and every morning. You can use a thick layer at night. This should help heal this within about a week.    I would like to see you back in 3 months to see how you are doing with the new medication.

## 2022-07-25 NOTE — Progress Notes (Signed)
Worthy Keeler, DNP, AGNP-c [facility] [place],  [address] [phone] Office [phone] Fax  ESTABLISHED PATIENT- Chronic Health and/or Follow-Up Visit  Blood pressure 130/80, pulse 65, weight 283 lb 3.2 oz (128.5 kg).    Steven Hayden is a 71 y.o. year old male presenting today for evaluation and management of the following: med check (Fasting med check, has appt with dermatology tomorrow- having a lot of itching, depressed due to itching so much. Fingertips will feel like they are sensitive and raw)  Mr. Steven Hayden is a former patient of Steven Dingwall, NP with this practice. He presents today for chronic follow-up and transition of care to me as PCP.   ADHD Mr. Steven Hayden endorses a history of ADHD which he was previously managed with adderall. He tells me that he was taken off of his adderall, "possibly due to heart issues". He reports that since coming off of the medication he does not have any energy and he is unable to get up in the mornings. He tells that he is "not fit to be around" if he is not on the adderall. He would like to discuss restarting this today.    Itching Mr. Steven Hayden tells me that three years ago he began to experience pruritus. Initially it started after using a pillow at his mothers home and was located only where the pillow touched his skin. He reports that after several nights he realized the pillow was causing the symptoms and he stopped using it. Unfortunately, he reports that despite no longer using the pillow, he has continued to itch. He reports that this has now spread from his chest and back to upper extremities, lower extremities, and most recently his groin. He endorses that he is miserable and nothing seems to be effective for the itch except prednisone. He is taking hydroxyzine at bedtime and buspar during the day. He is also using triamcinolone "all over" when the itching becomes severe. He has  been seeing Dr. Allyson Hayden with dermatology for management and they have not been able to find a cause or effective treatment.   Hand Dryness He endorses about once a month he begins to experience dryness on his hands to the point of peeling and cracking. He denies any known irritants or exposures that could be triggering the symptoms. He reports this has been ongoing for quite some time. He has not tried anything to help with this.    COPD He endorses chronic COPD, but tells me that recently he was started on Trellegy and this has been a life changer for him. He denies shortness of breath with rest and minimal shortness of breath with exertion. No wheezing, chest pain, dizziness, or recent URI.   Chronic Conditions He endorses taking his medications as prescribed without missed doses. He reports that he is fatigued quite often, but feels that this is related to the loss of adderall. He endorses depressive symptoms due to the severe itching and the affect with his QOL. He denies CP, ShOB, palpitations, dizziness, edema, vision changes, or paresthesias.    He tells me that he drinks 2% milk and water    All ROS negative with exception of what is listed above.   PHYSICAL EXAM Physical Exam Vitals and nursing note reviewed.  Constitutional:      Appearance: Normal appearance. He is obese.  HENT:     Head: Normocephalic.  Eyes:     Extraocular Movements: Extraocular movements intact.  Pupils: Pupils are equal, round, and reactive to light.  Neck:     Vascular: No carotid bruit.  Cardiovascular:     Rate and Rhythm: Normal rate and regular rhythm.     Pulses: Normal pulses.     Heart sounds: Normal heart sounds.  Pulmonary:     Effort: Pulmonary effort is normal.     Breath sounds: Normal breath sounds.  Abdominal:     General: Bowel sounds are normal. There is no distension.     Palpations: Abdomen is soft.     Tenderness: There is no abdominal tenderness. There is no guarding.   Musculoskeletal:        General: Normal range of motion.     Cervical back: Normal range of motion.     Right lower leg: No edema.     Left lower leg: No edema.  Lymphadenopathy:     Cervical: No cervical adenopathy.  Skin:    General: Skin is warm and dry.     Capillary Refill: Capillary refill takes less than 2 seconds.     Findings: No ecchymosis or rash.       Neurological:     General: No focal deficit present.     Mental Status: He is alert and oriented to person, place, and time.  Psychiatric:        Mood and Affect: Mood normal.        Behavior: Behavior normal.        Thought Content: Thought content normal.        Judgment: Judgment normal.     PLAN Problem List Items Addressed This Visit     Attention deficit disorder    Chronic. Medication stopped, likely due to increased cardiovascular risks including atrial fibrillation and AAA. At this time I do not recommend stimulant medication due to the increased risks with exacerbating these conditions as well as increased BP risks. Patients primary concerns appear to be centered around energy levels, therefore I am hopeful that the start of fluoxetine will help with this and in turn relieve some of the symptoms he is experiencing. Will monitor closely for changes.       Relevant Medications   FLUoxetine (PROZAC) 20 MG tablet   Other Relevant Orders   CBC with Differential/Platelet   Comprehensive metabolic panel   Lipid panel   Hemoglobin A1c   T4, free   TSH   VITAMIN D 25 Hydroxy (Vit-D Deficiency, Fractures)   B12 and Folate Panel   Essential hypertension    Chronic. BP 130/80 today, which is at goal. No alarm sx present. Labs pending. Continue current medications. F/U in 3 months or sooner if needed.       Relevant Orders   CBC with Differential/Platelet   Comprehensive metabolic panel   Lipid panel   Hemoglobin A1c   T4, free   TSH   VITAMIN D 25 Hydroxy (Vit-D Deficiency, Fractures)   B12 and Folate  Panel   COPD (chronic obstructive pulmonary disease) (HCC)    Chronic. Well controlled at this time. No alarm sx present. Recommend continue current therapy and close monitoring with pulmonology.       Relevant Orders   CBC with Differential/Platelet   Comprehensive metabolic panel   Lipid panel   Hemoglobin A1c   T4, free   TSH   VITAMIN D 25 Hydroxy (Vit-D Deficiency, Fractures)   B12 and Folate Panel   AAA (abdominal aortic aneurysm) without rupture (HCC)    Chronic.  No alarm sx present at this time. Discussed with patient the risks associated with restarting adderall and the increase this could cause in HR and BP. At this time I do not recommend this medication for ADHD treatment. Continue to monitor closely.       Chronic kidney disease    Chronic. Labs pending today. Will monitor closely and ensure that medication dosages are appropriate for kidney function.       Relevant Orders   CBC with Differential/Platelet   Comprehensive metabolic panel   Lipid panel   Hemoglobin A1c   T4, free   TSH   VITAMIN D 25 Hydroxy (Vit-D Deficiency, Fractures)   B12 and Folate Panel   Prediabetes    Chronic. Last A1c in July/August. Diet controlled only. Will monitor A1c today and make changes to plan of care as necessary based on findings.       Relevant Orders   CBC with Differential/Platelet   Comprehensive metabolic panel   Lipid panel   Hemoglobin A1c   T4, free   TSH   VITAMIN D 25 Hydroxy (Vit-D Deficiency, Fractures)   B12 and Folate Panel   Paroxysmal atrial fibrillation (HCC)    Chronic. On chronic anticoagulation. No signs of bleeding present. HRRR today. No alarm sx present at this time. Will monitor labs today. Continue current medications. Patient does have concerns with metoprolol due to side effect of itching listed- encouraged him to discuss with cardiology, although itching started prior to start of this medication so this is very unlikely a contributor. We will  plan to monitor closely.      Relevant Orders   CBC with Differential/Platelet   Comprehensive metabolic panel   Lipid panel   Hemoglobin A1c   T4, free   TSH   VITAMIN D 25 Hydroxy (Vit-D Deficiency, Fractures)   B12 and Folate Panel   Mixed hyperlipidemia - Primary    Chronic. On appropriate therapy. Labs pending today.       Relevant Orders   CBC with Differential/Platelet   Comprehensive metabolic panel   Lipid panel   Hemoglobin A1c   T4, free   TSH   VITAMIN D 25 Hydroxy (Vit-D Deficiency, Fractures)   B12 and Folate Panel   Chronic urticaria    Etiology unknown. Appears that initial onset related to possible allergic response to memory foam pillow, however, continued exacerbation cause unclear. Unfortunately dermatology has not been able to find a cause. It is possible that this is neurologic or psychogenic in nature. There is an area of mild lichenification on the lower back area, likely from chronic scratching. Given patients history of anxiety, I do feel that a trial of SSRI is warranted to see if this is helpful. Discussion with patient about medication and he is in agreement to try. If not effective, we may consider trial of gabapentin at bedtime, but will need to also D/C hydroxyzine to prevent increased sedation. Possibly the combination of fluoxetine and gabapentin would be helpful for management of his symptoms. We will monitor effects of fluoxetine alone in 3 months and consider alternatives for this as needed.       Relevant Medications   FLUoxetine (PROZAC) 20 MG tablet   triamcinolone ointment (KENALOG) 0.1 %   Chronic pruritus    See chronic urticaria.       Relevant Medications   FLUoxetine (PROZAC) 20 MG tablet   triamcinolone ointment (KENALOG) 0.1 %   Other Relevant Orders   CBC with Differential/Platelet  Comprehensive metabolic panel   Lipid panel   Hemoglobin A1c   T4, free   TSH   VITAMIN D 25 Hydroxy (Vit-D Deficiency, Fractures)   B12 and  Folate Panel   Anxiety    Chronic. Currently on buspar with no reported improvement of symptoms. Recommend start of fluoxetine to see if this helps with symptom management. I do feel that anxiety and possible obsessive behaviors are contributing to many of his concerns, therefore, I am hopeful that this will make a significant improvement in his mood and overall health. We will plan to follow closely.       Relevant Medications   FLUoxetine (PROZAC) 20 MG tablet    Return in about 3 months (around 10/25/2022) for Multiple Chronic Condition F/U.   Worthy Keeler, DNP, AGNP-c 07/25/2022  8:41 AM

## 2022-07-26 DIAGNOSIS — L503 Dermatographic urticaria: Secondary | ICD-10-CM | POA: Diagnosis not present

## 2022-07-26 DIAGNOSIS — L298 Other pruritus: Secondary | ICD-10-CM | POA: Diagnosis not present

## 2022-07-26 DIAGNOSIS — L2089 Other atopic dermatitis: Secondary | ICD-10-CM | POA: Diagnosis not present

## 2022-07-26 LAB — CBC WITH DIFFERENTIAL/PLATELET
Basophils Absolute: 0 10*3/uL (ref 0.0–0.2)
Basos: 0 %
EOS (ABSOLUTE): 0.1 10*3/uL (ref 0.0–0.4)
Eos: 1 %
Hematocrit: 49.2 % (ref 37.5–51.0)
Hemoglobin: 17 g/dL (ref 13.0–17.7)
Immature Grans (Abs): 0 10*3/uL (ref 0.0–0.1)
Immature Granulocytes: 0 %
Lymphocytes Absolute: 3 10*3/uL (ref 0.7–3.1)
Lymphs: 39 %
MCH: 32.4 pg (ref 26.6–33.0)
MCHC: 34.6 g/dL (ref 31.5–35.7)
MCV: 94 fL (ref 79–97)
Monocytes Absolute: 0.8 10*3/uL (ref 0.1–0.9)
Monocytes: 11 %
Neutrophils Absolute: 3.8 10*3/uL (ref 1.4–7.0)
Neutrophils: 49 %
Platelets: 151 10*3/uL (ref 150–450)
RBC: 5.25 x10E6/uL (ref 4.14–5.80)
RDW: 13.1 % (ref 11.6–15.4)
WBC: 7.8 10*3/uL (ref 3.4–10.8)

## 2022-07-26 LAB — LIPID PANEL
Chol/HDL Ratio: 5.2 ratio — ABNORMAL HIGH (ref 0.0–5.0)
Cholesterol, Total: 187 mg/dL (ref 100–199)
HDL: 36 mg/dL — ABNORMAL LOW (ref >39)
LDL Chol Calc (NIH): 123 mg/dL — ABNORMAL HIGH (ref 0–99)
Triglycerides: 156 mg/dL — ABNORMAL HIGH (ref 0–149)
VLDL Cholesterol Cal: 28 mg/dL (ref 5–40)

## 2022-07-26 LAB — COMPREHENSIVE METABOLIC PANEL
ALT: 20 IU/L (ref 0–44)
AST: 20 IU/L (ref 0–40)
Albumin/Globulin Ratio: 1.6 (ref 1.2–2.2)
Albumin: 4.1 g/dL (ref 3.9–4.9)
Alkaline Phosphatase: 68 IU/L (ref 44–121)
BUN/Creatinine Ratio: 12 (ref 10–24)
BUN: 11 mg/dL (ref 8–27)
Bilirubin Total: 0.3 mg/dL (ref 0.0–1.2)
CO2: 27 mmol/L (ref 20–29)
Calcium: 9.5 mg/dL (ref 8.6–10.2)
Chloride: 102 mmol/L (ref 96–106)
Creatinine, Ser: 0.93 mg/dL (ref 0.76–1.27)
Globulin, Total: 2.6 g/dL (ref 1.5–4.5)
Glucose: 89 mg/dL (ref 70–99)
Potassium: 4.2 mmol/L (ref 3.5–5.2)
Sodium: 143 mmol/L (ref 134–144)
Total Protein: 6.7 g/dL (ref 6.0–8.5)
eGFR: 88 mL/min/{1.73_m2} (ref >59)

## 2022-07-26 LAB — HEMOGLOBIN A1C
Est. average glucose Bld gHb Est-mCnc: 126 mg/dL
Hgb A1c MFr Bld: 6 % — ABNORMAL HIGH (ref 4.8–5.6)

## 2022-07-26 LAB — B12 AND FOLATE PANEL
Folate: 6.6 ng/mL (ref >3.0)
Vitamin B-12: 438 pg/mL (ref 232–1245)

## 2022-07-26 LAB — VITAMIN D 25 HYDROXY (VIT D DEFICIENCY, FRACTURES): Vit D, 25-Hydroxy: 27.7 ng/mL — ABNORMAL LOW (ref 30.0–100.0)

## 2022-07-26 LAB — T4, FREE: Free T4: 0.79 ng/dL — ABNORMAL LOW (ref 0.82–1.77)

## 2022-07-26 LAB — TSH: TSH: 1.87 u[IU]/mL (ref 0.450–4.500)

## 2022-07-26 NOTE — Telephone Encounter (Signed)
Attempted to call pt but line went directly to VM. Left message for him to return call. Due to multiple attempts trying to reach pt and unable to do so, per protocol encounter will be closed. 

## 2022-08-11 ENCOUNTER — Other Ambulatory Visit: Payer: Self-pay | Admitting: Cardiothoracic Surgery

## 2022-08-11 ENCOUNTER — Other Ambulatory Visit: Payer: Self-pay | Admitting: *Deleted

## 2022-08-11 DIAGNOSIS — I712 Thoracic aortic aneurysm, without rupture, unspecified: Secondary | ICD-10-CM

## 2022-08-11 DIAGNOSIS — Z9889 Other specified postprocedural states: Secondary | ICD-10-CM

## 2022-08-14 ENCOUNTER — Encounter: Payer: Self-pay | Admitting: Pulmonary Disease

## 2022-08-14 ENCOUNTER — Telehealth: Payer: Self-pay | Admitting: Pulmonary Disease

## 2022-08-14 NOTE — Telephone Encounter (Signed)
Mychart message sent by pt: Steven Hayden [facility] (supporting Garner Nash, DO)22 minutes ago (3:31 PM)    have had chronic itch for 3 years. working with [facility] to no avail. have asked to try Rinvoc. wont prescribe for me without your approval. at wits end, nearly suicidal. someone has to stop this itch. will stop temporally with Predizone but only temp. Please above an other medical issue.I have HELP ME Thanks Steven Hayden    Dr. Valeta Harms, please advise.

## 2022-08-14 NOTE — Telephone Encounter (Signed)
Says he is ret a call from [facility]'s nurse re: rad results. I do not see encounter. Teams not avail. Please call to advise [phone]

## 2022-08-15 NOTE — Telephone Encounter (Signed)
Attempted to call patient. There is no phone message from our office about results. I am unsure what office called him. Asked him to call his primary care to determine if they called him. No tests have been done on patient from our office since start of year. Nothing further needed

## 2022-08-16 NOTE — Telephone Encounter (Signed)
New message sent by pt: Steven Hayden [facility] (supporting Garner Nash, DO)3 hours ago (7:23 AM)    guess Drs. Vu and [facility] missed a few key words in my last correspondence. Nearly Suicidal. 3 years and PLEASE HELP So, good to know all three of my Drs. agree on what not to do. Now, can you reconvene and someone end this itch. Dr. Valeta Harms I must have confused you on acohol consumption because I dont drink at home and the most I consume per week is 4 oz. of Vodka. As far as smoking Ive cut that to 2 thirds of a pack 15 per day My COPD is greatly improved with Trelegy and Nasal Cannula at night while I sleep. Keep check of my breathing several times a day with my Fingertip Pulse Oximeter. Always 90 or above, has been as high as 96 with no need for rescue inhalers. Very pleased with your help with my breathing. At wits end if you and Dr. Gale Journey leave me with     Routing to Dr. Valeta Harms.

## 2022-08-21 ENCOUNTER — Ambulatory Visit (HOSPITAL_COMMUNITY)
Admission: RE | Admit: 2022-08-21 | Discharge: 2022-08-21 | Disposition: A | Discharge status: Home / Self Care | Payer: Medicare HMO | Source: Ambulatory Visit | Attending: Surgery | Admitting: Surgery

## 2022-08-21 ENCOUNTER — Telehealth: Payer: Self-pay | Admitting: Nurse Practitioner

## 2022-08-21 DIAGNOSIS — Z9889 Other specified postprocedural states: Secondary | ICD-10-CM | POA: Insufficient documentation

## 2022-08-21 DIAGNOSIS — F419 Anxiety disorder, unspecified: Secondary | ICD-10-CM

## 2022-08-21 MED ORDER — BUSPIRONE HCL 5 MG PO TABS: 5.0000 mg | ORAL_TABLET | Freq: Two times a day (BID) | ORAL | 3 refills | Status: DC

## 2022-08-21 NOTE — Telephone Encounter (Signed)
Fax refill request for buspirone HCL 5 mg #60 Last filled 07/17/2022

## 2022-08-24 DIAGNOSIS — H2511 Age-related nuclear cataract, right eye: Secondary | ICD-10-CM | POA: Diagnosis not present

## 2022-08-24 DIAGNOSIS — H25041 Posterior subcapsular polar age-related cataract, right eye: Secondary | ICD-10-CM | POA: Diagnosis not present

## 2022-08-24 DIAGNOSIS — H2512 Age-related nuclear cataract, left eye: Secondary | ICD-10-CM | POA: Diagnosis not present

## 2022-08-24 DIAGNOSIS — H25011 Cortical age-related cataract, right eye: Secondary | ICD-10-CM | POA: Diagnosis not present

## 2022-09-06 NOTE — Progress Notes (Deleted)
HISTORY AND PHYSICAL     CC:  follow up. For EVAR Requesting Provider:  Orma Render, NP  HPI: This is a 72 y.o. male who is here today for follow up for AAA and is s/p EVAR for 10cm AAA on  05/18/2014 by Dr. Bridgett Larsson.  Pt was last seen 04/26/2021 and at that time, he was doing well but had developed new onset of afib and was on Eliquis.  He had cut back from 2ppd to 1ppd of cigarettes.    The pt returns today for follow up studies.   The pt is on a statin for cholesterol management.    The pt is not on an aspirin.    Other AC:  eliquis The pt is on BB, ACEI for hypertension.  The pt does not have diabetes. Tobacco hx:  ***  Pt does not have family hx of AAA.  Past Medical History:  Diagnosis Date   AAA (abdominal aortic aneurysm) ([place])    ADD (attention deficit disorder)    Atrial fibrillation (HCC)    Bipolar affective disorder (HCC)    COPD (chronic obstructive pulmonary disease) (HCC)    Hyperlipidemia    Hypertension    Obesity (BMI 30-39.9) 04/15/2020   Prediabetes    Hgb A1c 6.4% August 2021   Schizophrenia [facility])    Statin declined 04/16/2020   States he will not take a statin. Has taken them in past.    Tobacco abuse     Past Surgical History:  Procedure Laterality Date   ABDOMINAL AORTIC ANEURYSM REPAIR     ABDOMINAL AORTIC ENDOVASCULAR STENT GRAFT N/A 05/18/2014   Procedure: ABDOMINAL AORTIC ENDOVASCULAR STENT GRAFT;  Surgeon: Conrad Paoli, MD;  Location: [facility] OR;  Service: Vascular;  Laterality: N/A;   KNEE SURGERY     bilateral    Allergies  Allergen Reactions   Codeine Phosphate     REACTION: swelling of uvula, hives   Dilaudid [Hydromorphone]    Pravastatin Sodium Other (See Comments)    Current Outpatient Medications  Medication Sig Dispense Refill   albuterol (VENTOLIN HFA) 108 (90 Base) MCG/ACT inhaler Inhale 2 puffs into the lungs every 6 (six) hours as needed for wheezing or shortness of breath. 18 g 2   apixaban (ELIQUIS) 5 MG TABS tablet Take 1  tablet (5 mg total) by mouth 2 (two) times daily. 180 tablet 2   busPIRone (BUSPAR) 5 MG tablet Take 1 tablet (5 mg total) by mouth 2 (two) times daily. 180 tablet 3   cholecalciferol (VITAMIN D3) 25 MCG (1000 UNIT) tablet Take 1 tablet (1,000 Units total) by mouth daily. 1 tablet 90 tablet 3   Dermatological Products, Misc. [facility]) lotion apply to body daily for maintenance     diphenhydrAMINE HCl (BENADRYL ALLERGY PO) Take by mouth.     FLUoxetine (PROZAC) 20 MG tablet Take 1/2 tab by mouth every morning for 4 days then increase to 1 tab by mouth every morning. For itching and energy. 90 tablet 1   Fluticasone-Umeclidin-Vilant (TRELEGY ELLIPTA) 200-62.5-25 MCG/ACT AEPB Inhale 1 puff into the lungs daily. 60 each 3   hydrOXYzine (ATARAX) 10 MG tablet take 1-2 pills po nightly for itching 90 tablet 5   ketoconazole (NIZORAL) 2 % cream      lisinopril (ZESTRIL) 20 MG tablet Take 1 tablet (20 mg total) by mouth daily. 90 tablet 3   MAGNESIUM MALATE PO 4050 mg     metoprolol succinate (TOPROL XL) 25 MG 24 hr tablet  Take 1 tablet (25 mg total) by mouth daily. 90 tablet 3   rosuvastatin (CRESTOR) 5 MG tablet TAKE ONE TABLET BY MOUTH EVERY OTHER DAY 45 tablet 1   triamcinolone ointment (KENALOG) 0.1 % Use a very thin layer on rash once to twice a day for no more than 5 days straight then break for at least 30 days. 80 g 2   No current facility-administered medications for this visit.    Family History  Problem Relation Age of Onset   Hypertension Brother    Hyperlipidemia Brother    Hypertension Father    Hyperlipidemia Father    Cancer Father     Social History   Socioeconomic History   Marital status: Divorced    Spouse name: Not on file   Number of children: 1   Years of education: Not on file   Highest education level: Not on file  Occupational History   Not on file  Tobacco Use   Smoking status: Some Days    Packs/day: 2.00    Years: 50.00    Total pack years: 100.00     Types: Cigarettes   Smokeless tobacco: Never   Tobacco comments:    currently smoking 1ppd as of 10/07/20  Substance and Sexual Activity   Alcohol use: Yes    Alcohol/week: 20.0 standard drinks of alcohol    Types: 20 Shots of liquor per week    Comment: 3 shots every other day   Drug use: No   Sexual activity: Not on file  Other Topics Concern   Not on file  Social History Narrative   Not on file   Social Determinants of Health   Financial Resource Strain: Not on file  Food Insecurity: Not on file  Transportation Needs: Not on file  Physical Activity: Not on file  Stress: Not on file  Social Connections: Not on file  Intimate Partner Violence: Not on file     REVIEW OF SYSTEMS:  *** '[X]'$  denotes positive finding, '[ ]'$  denotes negative finding Cardiac  Comments:  Chest pain or chest pressure:    Shortness of breath upon exertion:    Short of breath when lying flat:    Irregular heart rhythm:        Vascular    Pain in calf, thigh, or hip brought on by ambulation:    Pain in feet at night that wakes you up from your sleep:     Blood clot in your veins:    Leg swelling:         Pulmonary    Oxygen at home:    Productive cough:     Wheezing:         Neurologic    Sudden weakness in arms or legs:     Sudden numbness in arms or legs:     Sudden onset of difficulty speaking or slurred speech:    Temporary loss of vision in one eye:     Problems with dizziness:         Gastrointestinal    Blood in stool:     Vomited blood:         Genitourinary    Burning when urinating:     Blood in urine:        Psychiatric    Major depression:         Hematologic    Bleeding problems:    Problems with blood clotting too easily:        Skin  Rashes or ulcers:        Constitutional    Fever or chills:      PHYSICAL EXAMINATION:  ***  General:  WDWN in NAD; vital signs documented above Gait: Not observed HENT: WNL, normocephalic Pulmonary: normal non-labored  breathing  Cardiac: {Desc; regular/irreg:14544} HR;  {With/Without:20273} carotid bruit*** Abdomen: soft, NT; aortic pulse is *** palpable Skin: {With/Without:20273} rashes Vascular Exam/Pulses:  Right Left  Radial {Exam; arterial pulse strength 0-4:30167} {Exam; arterial pulse strength 0-4:30167}  Femoral {Exam; arterial pulse strength 0-4:30167} {Exam; arterial pulse strength 0-4:30167}  Popliteal {Exam; arterial pulse strength 0-4:30167} {Exam; arterial pulse strength 0-4:30167}  DP {Exam; arterial pulse strength 0-4:30167} {Exam; arterial pulse strength 0-4:30167}  PT {Exam; arterial pulse strength 0-4:30167} {Exam; arterial pulse strength 0-4:30167}   Extremities: {With/Without:20273} open wounds Musculoskeletal: no muscle wasting or atrophy  Neurologic: A&O X 3 Psychiatric:  The pt has {Desc; normal/abnormal:11317::"Normal"} affect.   Non-Invasive Vascular Imaging:   EVAR Arterial duplex on 08/21/2022: Endovascular Aortic Repair (EVAR):  +----------+----------------+-------------------+-------------------+           Diameter AP (cm)Diameter Trans (cm)Velocities (cm/sec)  +----------+----------------+-------------------+-------------------+  Aorta    4.67            6.10               63                   +----------+----------------+-------------------+-------------------+  Right Limb1.79            2.29               59                   +----------+----------------+-------------------+-------------------+  Left Limb 1.76            1.97               44                   +----------+----------------+-------------------+-------------------+   Previous EVAR arterial duplex on 04/26/2021: Endovascular Aortic Repair (EVAR):  +----------+----------------+-------------------+-------------------+           Diameter AP (cm)Diameter Trans (cm)Velocities (cm/sec)  +----------+----------------+-------------------+-------------------+  Aorta    5.74             5.97               52                   +----------+----------------+-------------------+-------------------+  Right Limb1.58            1.58               58                   +----------+----------------+-------------------+-------------------+  Left Limb 1.59            1.71               51                   +----------+----------------+-------------------+-------------------+    ASSESSMENT/PLAN:: 72 y.o. male here with hx of EVAR for 10cm AAA on  05/18/2014 by Dr. Bridgett Larsson.  -*** -EVAR duplex essentially unchanged from previous duplexes over past couple of years.   -continue *** -pt will f/u in *** with ***.   Leontine Locket, [facility] [phone]  Clinic MD:   Scot Dock

## 2022-09-07 ENCOUNTER — Ambulatory Visit: Payer: Medicare HMO

## 2022-09-13 ENCOUNTER — Other Ambulatory Visit: Payer: Self-pay | Admitting: Nurse Practitioner

## 2022-09-13 DIAGNOSIS — E559 Vitamin D deficiency, unspecified: Secondary | ICD-10-CM

## 2022-09-14 DIAGNOSIS — H25011 Cortical age-related cataract, right eye: Secondary | ICD-10-CM | POA: Diagnosis not present

## 2022-09-14 DIAGNOSIS — H25041 Posterior subcapsular polar age-related cataract, right eye: Secondary | ICD-10-CM | POA: Diagnosis not present

## 2022-09-14 DIAGNOSIS — H2511 Age-related nuclear cataract, right eye: Secondary | ICD-10-CM | POA: Diagnosis not present

## 2022-10-02 ENCOUNTER — Ambulatory Visit: Payer: Medicare HMO | Admitting: Cardiothoracic Surgery

## 2022-10-02 ENCOUNTER — Encounter: Payer: Self-pay | Admitting: Cardiothoracic Surgery

## 2022-10-02 ENCOUNTER — Inpatient Hospital Stay: Admission: RE | Admit: 2022-10-02 | Payer: Medicare HMO | Source: Ambulatory Visit

## 2022-10-04 ENCOUNTER — Telehealth: Payer: Self-pay | Admitting: Nurse Practitioner

## 2022-10-04 NOTE — Progress Notes (Signed)
Office Note     CC:  follow up Requesting Provider:  Orma Render, NP  HPI: Steven Hayden is a 72 y.o. (01-13-1951) male who presents for surveillance follow up of AAA. He has remote history of  EVAR on  05/18/2014 by Dr. Bridgett Larsson. Pt has not been seen since August of 2022. At that time, he was doing well without back or abdominal pain or claudication or rest pain or non healing wounds.  He was working on quitting smoking at that time.  He had developed new onset afib so was started on Eliquis  Today he reports not back or abdominal pain. His biggest concern is all over itching. He says this has been on going for 4 years. It is non stop and he says he has seen Dermatology, his PCP, Cardiologist etc with no relief despite various trials of medication. He says it is literally driving him crazy. He says the only thing that helps is to get drunk. He explains he will get momentary relief with application of triamcinolone cream and a zpack but he says he is unable to keep taking that. He also says he has seen advertisement for medication that is suppose to stop itching but his cardiologist has advised him not to take it. He says he really feels at a loss.    The pt is not on a statin for cholesterol management.    The pt is not on an aspirin.    Other AC:  Eliquis The pt is on BB, ACEI for hypertension.  The pt does not have diabetes. Tobacco hx:  current  Past Medical History:  Diagnosis Date   AAA (abdominal aortic aneurysm) ([place])    ADD (attention deficit disorder)    Atrial fibrillation (HCC)    Bipolar affective disorder (HCC)    COPD (chronic obstructive pulmonary disease) (HCC)    Hyperlipidemia    Hypertension    Obesity (BMI 30-39.9) 04/15/2020   Prediabetes    Hgb A1c 6.4% August 2021   Schizophrenia [facility])    Statin declined 04/16/2020   States he will not take a statin. Has taken them in past.    Tobacco abuse     Past Surgical History:  Procedure Laterality Date   ABDOMINAL  AORTIC ANEURYSM REPAIR     ABDOMINAL AORTIC ENDOVASCULAR STENT GRAFT N/A 05/18/2014   Procedure: ABDOMINAL AORTIC ENDOVASCULAR STENT GRAFT;  Surgeon: Conrad Pine Level, MD;  Location: [facility] OR;  Service: Vascular;  Laterality: N/A;   KNEE SURGERY     bilateral    Social History   Socioeconomic History   Marital status: Divorced    Spouse name: Not on file   Number of children: 1   Years of education: Not on file   Highest education level: Not on file  Occupational History   Not on file  Tobacco Use   Smoking status: Every Day    Packs/day: 2.00    Years: 50.00    Total pack years: 100.00    Types: Cigarettes    Passive exposure: Never   Smokeless tobacco: Never   Tobacco comments:    currently smoking 1ppd as of 10/07/20  Substance and Sexual Activity   Alcohol use: Yes    Alcohol/week: 20.0 standard drinks of alcohol    Types: 20 Shots of liquor per week    Comment: 3 shots every other day   Drug use: No   Sexual activity: Not on file  Other Topics Concern   Not  on file  Social History Narrative   Not on file   Social Determinants of Health   Financial Resource Strain: Not on file  Food Insecurity: Not on file  Transportation Needs: Not on file  Physical Activity: Not on file  Stress: Not on file  Social Connections: Not on file  Intimate Partner Violence: Not on file    Family History  Problem Relation Age of Onset   Hypertension Brother    Hyperlipidemia Brother    Hypertension Father    Hyperlipidemia Father    Cancer Father     Current Outpatient Medications  Medication Sig Dispense Refill   albuterol (VENTOLIN HFA) 108 (90 Base) MCG/ACT inhaler Inhale 2 puffs into the lungs every 6 (six) hours as needed for wheezing or shortness of breath. 18 g 2   apixaban (ELIQUIS) 5 MG TABS tablet Take 1 tablet (5 mg total) by mouth 2 (two) times daily. 180 tablet 2   busPIRone (BUSPAR) 5 MG tablet Take 1 tablet (5 mg total) by mouth 2 (two) times daily. 180 tablet 3    cholecalciferol (VITAMIN D3) 25 MCG (1000 UNIT) tablet Take 1 tablet (1,000 Units total) by mouth daily. 1 tablet 90 tablet 3   Dermatological Products, Misc. [facility]) lotion apply to body daily for maintenance     diphenhydrAMINE HCl (BENADRYL ALLERGY PO) Take by mouth.     FLUoxetine (PROZAC) 20 MG tablet Take 1/2 tab by mouth every morning for 4 days then increase to 1 tab by mouth every morning. For itching and energy. 90 tablet 1   Fluticasone-Umeclidin-Vilant (TRELEGY ELLIPTA) 200-62.5-25 MCG/ACT AEPB Inhale 1 puff into the lungs daily. 60 each 3   hydrOXYzine (ATARAX) 10 MG tablet take 1-2 pills po nightly for itching 90 tablet 5   ketoconazole (NIZORAL) 2 % cream      lisinopril (ZESTRIL) 20 MG tablet Take 1 tablet (20 mg total) by mouth daily. 90 tablet 3   MAGNESIUM MALATE PO 4050 mg     metoprolol succinate (TOPROL XL) 25 MG 24 hr tablet Take 1 tablet (25 mg total) by mouth daily. 90 tablet 3   rosuvastatin (CRESTOR) 5 MG tablet TAKE ONE TABLET BY MOUTH EVERY OTHER DAY 45 tablet 1   triamcinolone ointment (KENALOG) 0.1 % Use a very thin layer on rash once to twice a day for no more than 5 days straight then break for at least 30 days. 80 g 2   No current facility-administered medications for this visit.    Allergies  Allergen Reactions   Codeine Phosphate     REACTION: swelling of uvula, hives   Dilaudid [Hydromorphone]    Pravastatin Sodium Other (See Comments)     REVIEW OF SYSTEMS:  '[X]'$  denotes positive finding, '[ ]'$  denotes negative finding Cardiac  Comments:  Chest pain or chest pressure:    Shortness of breath upon exertion:    Short of breath when lying flat:    Irregular heart rhythm:        Vascular    Pain in calf, thigh, or hip brought on by ambulation:    Pain in feet at night that wakes you up from your sleep:     Blood clot in your veins:    Leg swelling:         Pulmonary    Oxygen at home:    Productive cough:     Wheezing:         Neurologic     Sudden weakness in  arms or legs:     Sudden numbness in arms or legs:     Sudden onset of difficulty speaking or slurred speech:    Temporary loss of vision in one eye:     Problems with dizziness:         Gastrointestinal    Blood in stool:     Vomited blood:         Genitourinary    Burning when urinating:     Blood in urine:        Psychiatric    Major depression:         Hematologic    Bleeding problems:    Problems with blood clotting too easily:        Skin    Rashes or ulcers:        Constitutional    Fever or chills:      PHYSICAL EXAMINATION:  Vitals:   10/10/22 1500  BP: (!) 145/95  Pulse: 74  Resp: 20  Temp: 98.6 F (37 C)  TempSrc: Temporal  SpO2: 95%  Weight: 278 lb 8 oz (126.3 kg)  Height: 6' (1.829 m)    General:  WDWN in NAD; vital signs documented above Gait: Normal HENT: WNL, normocephalic Pulmonary: normal non-labored breathing Cardiac: regular HR Abdomen: distended, soft Extremities: without ischemic changes, without Gangrene , without cellulitis; without open wounds Musculoskeletal: no muscle wasting or atrophy  Neurologic: A&O X 3;  No focal weakness or paresthesias are detected Psychiatric:  The pt has Normal affect.   Non-Invasive Vascular Imaging:  08/21/22 Endovascular Aortic Repair (EVAR):  +----------+----------------+-------------------+-------------------+           Diameter AP (cm)Diameter Trans (cm)Velocities (cm/sec)  +----------+----------------+-------------------+-------------------+  Aorta    4.67            6.10               63                   +----------+----------------+-------------------+-------------------+  Right Limb1.79            2.29               59                   +----------+----------------+-------------------+-------------------+  Left Limb 1.76            1.97               44                   +----------+----------------+-------------------+-------------------+    Summary:  Abdominal Aorta: Patent endovascular aneurysm repair with no evidence of endoleak. Essentially unchanged when compared to prior study.      ASSESSMENT/PLAN:: 72 y.o. male here for surveillance follow up of AAA. He has remote history of  EVAR on 05/18/2014 by Dr. Bridgett Larsson. He is not having any back or abdominal pain. He is without any claudication,rest pain or tissue loss. His recent duplex shows stable AAA s/p EVAR. No endoleak noted - Recommend that he follow up with PCP regarding his itching. I suspect that this may be medication induced and he may benefit from an elimination trial to decipher it. He has no external signs of underlying dermatological causes. No rash or exanthem appreciable on exam -Continue Eliquis, statin -Recommend he follow up again in 1 year with repeat EVAR duplex  Karoline Caldwell, PA-C [facility] [phone]  Clinic MD:   [facility]

## 2022-10-04 NOTE — Telephone Encounter (Signed)
Pt called re metropolol rx, wants to know who Francis Gaines is and why she prescribed it. Advised pt that [name] was here after Vickie but no longer here and that from what I can tell he was on that medication before we started seeing him.  He states he read that is causes itching and he has been itching so he stopped it. He has not taken is about a month.  I advised him that I would send message back and have someone call him back

## 2022-10-05 ENCOUNTER — Ambulatory Visit
Admission: RE | Admit: 2022-10-05 | Discharge: 2022-10-05 | Disposition: A | Discharge status: Home / Self Care | Payer: Medicare HMO | Source: Ambulatory Visit | Attending: Cardiothoracic Surgery | Admitting: Cardiothoracic Surgery

## 2022-10-05 DIAGNOSIS — I712 Thoracic aortic aneurysm, without rupture, unspecified: Secondary | ICD-10-CM

## 2022-10-05 DIAGNOSIS — I2510 Atherosclerotic heart disease of native coronary artery without angina pectoris: Secondary | ICD-10-CM | POA: Diagnosis not present

## 2022-10-05 DIAGNOSIS — J432 Centrilobular emphysema: Secondary | ICD-10-CM | POA: Diagnosis not present

## 2022-10-05 DIAGNOSIS — I771 Stricture of artery: Secondary | ICD-10-CM | POA: Diagnosis not present

## 2022-10-05 MED ORDER — IOPAMIDOL (ISOVUE-370) INJECTION 76%
75.0000 mL | Freq: Once | INTRAVENOUS | Status: AC | PRN
  Administered 2022-10-05: 75 mL via INTRAVENOUS

## 2022-10-05 NOTE — Telephone Encounter (Signed)
Please call him and let him know this medication was likely started for his atrial fibrillation. It is a medication that helps to control heart rate and make the heart pump for efficiently. If the itching has stopped after stopping the medication I would like for him to let me know. He would have relief within 1-2 days as all medication would be out of his system. If he has not had relief I STRONGLY recommend that he restart the medication immediately. He has multiple risk factors that make stopping this medication dangerous. This medication is typically managed with cardiology. I would recommend he discuss alternative options with cardiology.

## 2022-10-10 ENCOUNTER — Ambulatory Visit (INDEPENDENT_AMBULATORY_CARE_PROVIDER_SITE_OTHER): Payer: Medicare HMO | Admitting: Physician Assistant

## 2022-10-10 ENCOUNTER — Encounter: Payer: Self-pay | Admitting: Physician Assistant

## 2022-10-10 VITALS — BP 145/95 | HR 74 | Temp 98.6°F | Resp 20 | Ht 72.0 in | Wt 278.5 lb

## 2022-10-10 DIAGNOSIS — Z9889 Other specified postprocedural states: Secondary | ICD-10-CM

## 2022-10-10 DIAGNOSIS — I714 Abdominal aortic aneurysm, without rupture, unspecified: Secondary | ICD-10-CM | POA: Diagnosis not present

## 2022-10-10 DIAGNOSIS — Z48812 Encounter for surgical aftercare following surgery on the circulatory system: Secondary | ICD-10-CM

## 2022-10-10 DIAGNOSIS — L299 Pruritus, unspecified: Secondary | ICD-10-CM

## 2022-10-10 DIAGNOSIS — F319 Bipolar disorder, unspecified: Secondary | ICD-10-CM | POA: Diagnosis not present

## 2022-10-26 ENCOUNTER — Ambulatory Visit (INDEPENDENT_AMBULATORY_CARE_PROVIDER_SITE_OTHER): Payer: Medicare HMO | Admitting: Nurse Practitioner

## 2022-10-26 ENCOUNTER — Telehealth: Payer: Self-pay | Admitting: Family Medicine

## 2022-10-26 ENCOUNTER — Encounter: Payer: Self-pay | Admitting: Nurse Practitioner

## 2022-10-26 VITALS — BP 130/84 | HR 76 | Wt 278.6 lb

## 2022-10-26 DIAGNOSIS — N189 Chronic kidney disease, unspecified: Secondary | ICD-10-CM

## 2022-10-26 DIAGNOSIS — Z6837 Body mass index (BMI) 37.0-37.9, adult: Secondary | ICD-10-CM | POA: Diagnosis not present

## 2022-10-26 DIAGNOSIS — J449 Chronic obstructive pulmonary disease, unspecified: Secondary | ICD-10-CM | POA: Diagnosis not present

## 2022-10-26 DIAGNOSIS — F209 Schizophrenia, unspecified: Secondary | ICD-10-CM | POA: Diagnosis not present

## 2022-10-26 DIAGNOSIS — F419 Anxiety disorder, unspecified: Secondary | ICD-10-CM

## 2022-10-26 DIAGNOSIS — L280 Lichen simplex chronicus: Secondary | ICD-10-CM

## 2022-10-26 DIAGNOSIS — L508 Other urticaria: Secondary | ICD-10-CM

## 2022-10-26 DIAGNOSIS — E782 Mixed hyperlipidemia: Secondary | ICD-10-CM

## 2022-10-26 DIAGNOSIS — L853 Xerosis cutis: Secondary | ICD-10-CM

## 2022-10-26 DIAGNOSIS — L299 Pruritus, unspecified: Secondary | ICD-10-CM

## 2022-10-26 DIAGNOSIS — I10 Essential (primary) hypertension: Secondary | ICD-10-CM

## 2022-10-26 MED ORDER — KETOCONAZOLE 2 % EX SHAM: MEDICATED_SHAMPOO | CUTANEOUS | 6 refills | Status: DC

## 2022-10-26 MED ORDER — GABAPENTIN 300 MG PO CAPS: 300.0000 mg | ORAL_CAPSULE | Freq: Three times a day (TID) | ORAL | 1 refills | Status: DC

## 2022-10-26 MED ORDER — TRIAMCINOLONE ACETONIDE 0.1 % EX CREA: TOPICAL_CREAM | Freq: Two times a day (BID) | CUTANEOUS | 3 refills | Status: DC

## 2022-10-26 NOTE — Patient Instructions (Addendum)
I am going to start you on a medication called Gabapentin to see if this helps. This is for nerve pain, but can also help with itching caused by nerves.   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   I have looked through all of the medications you have taken since this started and there is nothing that is the same.   I am going to also start you on a special body wash to see if this helps with the area on your back.

## 2022-10-26 NOTE — Telephone Encounter (Signed)
Steven Hayden does not compound meds.  Please send in separate rx for Ketoconazole  and separate rx for Triamcinolone cream

## 2022-10-26 NOTE — Progress Notes (Signed)
Steven Keeler, DNP, AGNP-c [place]  [address] [place], [place] [address] [phone]  ESTABLISHED PATIENT- Chronic Health and/or Follow-Up Visit  Blood pressure 130/84, pulse 76, weight 278 lb 9.6 oz (126.4 kg), SpO2 93 %.    Steven Hayden is a 72 y.o. year old male presenting today for evaluation and management of the following: Itching  Steven Hayden presents today with persistent itching that has been troubling him for approximately four years. He expresses significant distress, noting that the condition is impacting his quality of life and sleep. He describes the symptoms have become so intrusive that he has had suicidal ideation at times.   The onset of his itching coincides with the time he received his first COVID-19 vaccination on December 23rd, 2021. Despite consulting multiple specialists, including dermatologists, a cardiologist, and a pulmonologist, he has not found relief or a definitive diagnosis. The itching, described as emanating from inside out, affects his head, chest, arms, armpits, back, and upper legs. He has lichenification near his lower back due to frequent scratching.  He denies any known tick bites, changes to detergent, soap, lotion, cleansers in the home, furniture, or environment. There is no evidence of rash on the skin aside from dermographia.   Steven Hayden is concerned about the potential role of his medications in causing the itching. Review of medications at the time of onset vs current time show none of the same medications with the exception of inhaled steroids, and the current brand is different than when the symptoms started. He admits to occasional non-adherence, sometimes missing doses for a few days. He is open to discontinuing medications one by one to determine if they contribute to his symptoms, with caution for those that cannot be stopped abruptly.  In addition to itching, Steven Hayden experiences changes to his hands, which become  wrinkled, peeling, and may bleed due to rawness. He has also noticed the development of skin tags and moles since the itching began. He denies any changes in hydration habits and has been using a steroid cream prescribed by his dermatologist for temporary relief.  Steven Hayden is frustrated with the ongoing symptoms and requests a referral to [facility] if no solution is found. He inquires about the possibility of having a tapeworm or shingles, which are ruled out during the visit. He is interested in non-steroidal treatments like Rinvoq, although his cardiologist and pulmonologist have advised against it due to his heart condition. He also requests further investigation into the suitability of Morrie Sheldon for his condition.  All ROS negative with exception of what is listed above.   PHYSICAL EXAM Physical Exam Vitals and nursing note reviewed.  Constitutional:      General: He is in acute distress.     Appearance: Normal appearance. He is obese. He is not ill-appearing or toxic-appearing.  HENT:     Head: Normocephalic and atraumatic.  Eyes:     Extraocular Movements: Extraocular movements intact.     Pupils: Pupils are equal, round, and reactive to light.  Neck:     Vascular: No carotid bruit.  Cardiovascular:     Rate and Rhythm: Normal rate and regular rhythm.     Pulses: Normal pulses.     Heart sounds: Normal heart sounds.  Pulmonary:     Effort: Pulmonary effort is normal.     Breath sounds: Normal breath sounds.  Abdominal:     General: Bowel sounds are normal.     Palpations: Abdomen is soft.  Musculoskeletal:  General: No swelling. Normal range of motion.     Cervical back: Normal range of motion.  Lymphadenopathy:     Cervical: No cervical adenopathy.  Skin:    General: Skin is warm and dry.     Capillary Refill: Capillary refill takes less than 2 seconds.          Comments: Dermographia present in areas where the patient has scratched during the visit. The  erythema resolves within 5 minutes.   Neurological:     Mental Status: He is alert.     Sensory: No sensory deficit.     Motor: No weakness.     Coordination: Coordination normal.     PLAN Problem List Items Addressed This Visit     Schizophrenia ([place])    At this time unclear if patients mental health history plays a role in his chronic pruritus. He is clearly distraught over this with mention of suicidal ideation. Further evaluation reveals no current plan or intent. With a history of auditory hallucinations, we could consider tactile hallucinations present, as well.  Plan: -will monitor mental health and behaviors - if no response with gabapentin, consider antipsychotic medications       Chronic pruritus    Patient reports a 4-year history of generalized itching, with onset around the time of the first COVID-19 vaccination. At this time no clear evidence of linkage to the vaccine. Patient is not exactly sure if the itching started before or after the vaccine was administered. Extensive evaluation by dermatology, including negative patch testing and skin biopsy, has not identified a definitive cause. Temporary relief from steroids has been achieved, but no clear medication-related etiology has been identified. Lichenification is noted on the lower back due to chronic scratching. At this time, it appears that there has been quite a work-up with no resolution. The patient is clearly in mental and physical distress. We discussed that this has been ongoing for quite some time and many providers have been unable to help find a cause or solution, therefore, I will do my best, but I cannot make promises that I can stop this. I do suspect there may be a neurological component affecting this, such as neuropathic pruritus.  Plan: - Initiate gabapentin therapy for potential neuropathic pruritus, titrating based on response and tolerability. Start '300mg'$  TID with plan to increase bedtime dose by '300mg'$   every few weeks to max '900mg'$ .  - Prescribe steroid cream for lower back application twice daily. - Provide medicated shampoo for use as a body wash twice weekly. - Schedule follow-up in one week to assess treatment efficacy. - Order labs to rule out Lyme disease and other causes of pruritus. - Review medication list for drug-induced pruritus, with attention to Metoprolol. - Consider dermatology re-evaluation if no improvement.      Relevant Medications   gabapentin (NEURONTIN) 300 MG capsule   ketoconazole (NIZORAL) 2 % shampoo   Other Relevant Orders   Hepatitis C antibody (Completed)   HIV Antibody (routine testing w rflx) (Completed)   CBC with Differential/Platelet (Completed)   Comprehensive metabolic panel (Completed)   Iron, TIBC and Ferritin Panel (Completed)   TSH (Completed)   T4, free (Completed)   VITAMIN D 25 Hydroxy (Vit-D Deficiency, Fractures) (Completed)   C-reactive protein (Completed)   RPR (Completed)   Lyme Disease Serology w/Reflex (Completed)   Lichenification    Lichenification present on the lower back is likely due to chronic scratching. Suspect there may be a fungal component based on the  appearance.  Plan: - Steroid cream and antifungal to lower back daily.  - Educate on regular moisturization and irritant avoidance.       Xerosis of skin    Patient exhibits xerosis signs, especially on the hands, with periodic exacerbations. Plan: - topical corticosteroid cream and heavy moisturizer applied nightly  - avoid contact irritants.       Essential hypertension   Relevant Medications   gabapentin (NEURONTIN) 300 MG capsule   ketoconazole (NIZORAL) 2 % shampoo   Other Relevant Orders   Hepatitis C antibody (Completed)   HIV Antibody (routine testing w rflx) (Completed)   CBC with Differential/Platelet (Completed)   Comprehensive metabolic panel (Completed)   Iron, TIBC and Ferritin Panel (Completed)   TSH (Completed)   T4, free (Completed)    VITAMIN D 25 Hydroxy (Vit-D Deficiency, Fractures) (Completed)   C-reactive protein (Completed)   RPR (Completed)   COPD (chronic obstructive pulmonary disease) (HCC)   Relevant Medications   gabapentin (NEURONTIN) 300 MG capsule   ketoconazole (NIZORAL) 2 % shampoo   Other Relevant Orders   Hepatitis C antibody (Completed)   HIV Antibody (routine testing w rflx) (Completed)   CBC with Differential/Platelet (Completed)   Comprehensive metabolic panel (Completed)   Iron, TIBC and Ferritin Panel (Completed)   TSH (Completed)   T4, free (Completed)   VITAMIN D 25 Hydroxy (Vit-D Deficiency, Fractures) (Completed)   C-reactive protein (Completed)   RPR (Completed)   Chronic kidney disease   Relevant Medications   gabapentin (NEURONTIN) 300 MG capsule   ketoconazole (NIZORAL) 2 % shampoo   Other Relevant Orders   Hepatitis C antibody (Completed)   HIV Antibody (routine testing w rflx) (Completed)   CBC with Differential/Platelet (Completed)   Comprehensive metabolic panel (Completed)   Iron, TIBC and Ferritin Panel (Completed)   TSH (Completed)   T4, free (Completed)   VITAMIN D 25 Hydroxy (Vit-D Deficiency, Fractures) (Completed)   C-reactive protein (Completed)   RPR (Completed)   Mixed hyperlipidemia   Relevant Medications   gabapentin (NEURONTIN) 300 MG capsule   ketoconazole (NIZORAL) 2 % shampoo   Other Relevant Orders   Hepatitis C antibody (Completed)   HIV Antibody (routine testing w rflx) (Completed)   CBC with Differential/Platelet (Completed)   Comprehensive metabolic panel (Completed)   Iron, TIBC and Ferritin Panel (Completed)   TSH (Completed)   T4, free (Completed)   VITAMIN D 25 Hydroxy (Vit-D Deficiency, Fractures) (Completed)   C-reactive protein (Completed)   RPR (Completed)   Anxiety   Relevant Medications   gabapentin (NEURONTIN) 300 MG capsule   ketoconazole (NIZORAL) 2 % shampoo   Other Relevant Orders   Hepatitis C antibody (Completed)   HIV  Antibody (routine testing w rflx) (Completed)   CBC with Differential/Platelet (Completed)   Comprehensive metabolic panel (Completed)   Iron, TIBC and Ferritin Panel (Completed)   TSH (Completed)   T4, free (Completed)   VITAMIN D 25 Hydroxy (Vit-D Deficiency, Fractures) (Completed)   C-reactive protein (Completed)   RPR (Completed)   Body mass index (BMI) of 37.0 to 37.9 in adult   Relevant Medications   gabapentin (NEURONTIN) 300 MG capsule   ketoconazole (NIZORAL) 2 % shampoo   Other Relevant Orders   Hepatitis C antibody (Completed)   HIV Antibody (routine testing w rflx) (Completed)   CBC with Differential/Platelet (Completed)   Comprehensive metabolic panel (Completed)   Iron, TIBC and Ferritin Panel (Completed)   TSH (Completed)   T4, free (Completed)  VITAMIN D 25 Hydroxy (Vit-D Deficiency, Fractures) (Completed)   C-reactive protein (Completed)   RPR (Completed)   Other Visit Diagnoses     Chronic urticaria    -  Primary   Relevant Medications   gabapentin (NEURONTIN) 300 MG capsule   ketoconazole (NIZORAL) 2 % shampoo   Other Relevant Orders   Hepatitis C antibody (Completed)   HIV Antibody (routine testing w rflx) (Completed)   CBC with Differential/Platelet (Completed)   Comprehensive metabolic panel (Completed)   Iron, TIBC and Ferritin Panel (Completed)   TSH (Completed)   T4, free (Completed)   VITAMIN D 25 Hydroxy (Vit-D Deficiency, Fractures) (Completed)   C-reactive protein (Completed)   RPR (Completed)   Lyme Disease Serology w/Reflex (Completed)       Return in about 3 months (around 01/24/2023) for itching.  Time: 54 minutes, >50% spent counseling, care coordination, chart review, and documentation.   Steven Keeler, DNP, AGNP-c 10/26/2022  1:39 PM

## 2022-10-27 DIAGNOSIS — L280 Lichen simplex chronicus: Secondary | ICD-10-CM

## 2022-10-27 DIAGNOSIS — L853 Xerosis cutis: Secondary | ICD-10-CM | POA: Insufficient documentation

## 2022-10-27 HISTORY — DX: Lichen simplex chronicus: L28.0

## 2022-10-27 LAB — COMPREHENSIVE METABOLIC PANEL
ALT: 18 IU/L (ref 0–44)
AST: 21 IU/L (ref 0–40)
Albumin/Globulin Ratio: 1.6 (ref 1.2–2.2)
Albumin: 4.2 g/dL (ref 3.9–4.9)
Alkaline Phosphatase: 75 IU/L (ref 44–121)
BUN/Creatinine Ratio: 10 (ref 10–24)
BUN: 10 mg/dL (ref 8–27)
Bilirubin Total: 0.4 mg/dL (ref 0.0–1.2)
CO2: 26 mmol/L (ref 20–29)
Calcium: 9.1 mg/dL (ref 8.6–10.2)
Chloride: 100 mmol/L (ref 96–106)
Creatinine, Ser: 0.99 mg/dL (ref 0.76–1.27)
Globulin, Total: 2.7 g/dL (ref 1.5–4.5)
Glucose: 90 mg/dL (ref 70–99)
Potassium: 4.3 mmol/L (ref 3.5–5.2)
Sodium: 136 mmol/L (ref 134–144)
Total Protein: 6.9 g/dL (ref 6.0–8.5)
eGFR: 82 mL/min/{1.73_m2} (ref >59)

## 2022-10-27 LAB — CBC WITH DIFFERENTIAL/PLATELET
Basophils Absolute: 0 10*3/uL (ref 0.0–0.2)
Basos: 0 %
EOS (ABSOLUTE): 0 10*3/uL (ref 0.0–0.4)
Eos: 0 %
Hematocrit: 48 % (ref 37.5–51.0)
Hemoglobin: 16.3 g/dL (ref 13.0–17.7)
Immature Grans (Abs): 0 10*3/uL (ref 0.0–0.1)
Immature Granulocytes: 0 %
Lymphocytes Absolute: 2.2 10*3/uL (ref 0.7–3.1)
Lymphs: 24 %
MCH: 32.1 pg (ref 26.6–33.0)
MCHC: 34 g/dL (ref 31.5–35.7)
MCV: 95 fL (ref 79–97)
Monocytes Absolute: 0.7 10*3/uL (ref 0.1–0.9)
Monocytes: 8 %
Neutrophils Absolute: 6.1 10*3/uL (ref 1.4–7.0)
Neutrophils: 68 %
Platelets: 181 10*3/uL (ref 150–450)
RBC: 5.07 x10E6/uL (ref 4.14–5.80)
RDW: 13.6 % (ref 11.6–15.4)
WBC: 9.1 10*3/uL (ref 3.4–10.8)

## 2022-10-27 LAB — HEPATITIS C ANTIBODY: Hep C Virus Ab: NONREACTIVE

## 2022-10-27 LAB — C-REACTIVE PROTEIN: CRP: 7 mg/L (ref 0–10)

## 2022-10-27 LAB — IRON,TIBC AND FERRITIN PANEL
Ferritin: 187 ng/mL (ref 30–400)
Iron Saturation: 46 % (ref 15–55)
Iron: 125 ug/dL (ref 38–169)
Total Iron Binding Capacity: 270 ug/dL (ref 250–450)
UIBC: 145 ug/dL (ref 111–343)

## 2022-10-27 LAB — T4, FREE: Free T4: 0.86 ng/dL (ref 0.82–1.77)

## 2022-10-27 LAB — TSH: TSH: 0.933 u[IU]/mL (ref 0.450–4.500)

## 2022-10-27 LAB — RPR: RPR Ser Ql: NONREACTIVE

## 2022-10-27 LAB — LYME DISEASE SEROLOGY W/REFLEX: Lyme Total Antibody EIA: NEGATIVE

## 2022-10-27 LAB — VITAMIN D 25 HYDROXY (VIT D DEFICIENCY, FRACTURES): Vit D, 25-Hydroxy: 27.2 ng/mL — ABNORMAL LOW (ref 30.0–100.0)

## 2022-10-27 LAB — HIV ANTIBODY (ROUTINE TESTING W REFLEX): HIV Screen 4th Generation wRfx: NONREACTIVE

## 2022-10-27 MED ORDER — TRIAMCINOLONE ACETONIDE 0.5 % EX CREA: 1.0000 | TOPICAL_CREAM | Freq: Two times a day (BID) | CUTANEOUS | 3 refills | Status: DC

## 2022-10-27 MED ORDER — KETOCONAZOLE 2 % EX CREA: 1.0000 | TOPICAL_CREAM | Freq: Two times a day (BID) | CUTANEOUS | 3 refills | Status: DC

## 2022-10-27 NOTE — Assessment & Plan Note (Signed)
Lichenification present on the lower back is likely due to chronic scratching. Suspect there may be a fungal component based on the appearance.  Plan: - Steroid cream and antifungal to lower back daily.  - Educate on regular moisturization and irritant avoidance.

## 2022-10-27 NOTE — Assessment & Plan Note (Signed)
At this time unclear if patients mental health history plays a role in his chronic pruritus. He is clearly distraught over this with mention of suicidal ideation. Further evaluation reveals no current plan or intent. With a history of auditory hallucinations, we could consider tactile hallucinations present, as well.  Plan: -will monitor mental health and behaviors - if no response with gabapentin, consider antipsychotic medications

## 2022-10-27 NOTE — Assessment & Plan Note (Signed)
Patient reports a 4-year history of generalized itching, with onset around the time of the first COVID-19 vaccination. At this time no clear evidence of linkage to the vaccine. Patient is not exactly sure if the itching started before or after the vaccine was administered. Extensive evaluation by dermatology, including negative patch testing and skin biopsy, has not identified a definitive cause. Temporary relief from steroids has been achieved, but no clear medication-related etiology has been identified. Lichenification is noted on the lower back due to chronic scratching. At this time, it appears that there has been quite a work-up with no resolution. The patient is clearly in mental and physical distress. We discussed that this has been ongoing for quite some time and many providers have been unable to help find a cause or solution, therefore, I will do my best, but I cannot make promises that I can stop this. I do suspect there may be a neurological component affecting this, such as neuropathic pruritus.  Plan: - Initiate gabapentin therapy for potential neuropathic pruritus, titrating based on response and tolerability. Start '300mg'$  TID with plan to increase bedtime dose by '300mg'$  every few weeks to max '900mg'$ .  - Prescribe steroid cream for lower back application twice daily. - Provide medicated shampoo for use as a body wash twice weekly. - Schedule follow-up in one week to assess treatment efficacy. - Order labs to rule out Lyme disease and other causes of pruritus. - Review medication list for drug-induced pruritus, with attention to Metoprolol. - Consider dermatology re-evaluation if no improvement.

## 2022-10-27 NOTE — Assessment & Plan Note (Signed)
Patient exhibits xerosis signs, especially on the hands, with periodic exacerbations. Plan: - topical corticosteroid cream and heavy moisturizer applied nightly  - avoid contact irritants.

## 2022-10-30 ENCOUNTER — Encounter: Payer: Self-pay | Admitting: Nurse Practitioner

## 2022-10-30 DIAGNOSIS — F458 Other somatoform disorders: Secondary | ICD-10-CM

## 2022-11-16 ENCOUNTER — Telehealth: Payer: Self-pay | Admitting: Nurse Practitioner

## 2022-11-16 NOTE — Telephone Encounter (Signed)
Contacted Steven Hayden to schedule their annual wellness visit. Appointment made for 11/21/22.  Steven Hayden AWV direct phone # [phone]*

## 2022-11-21 ENCOUNTER — Ambulatory Visit (INDEPENDENT_AMBULATORY_CARE_PROVIDER_SITE_OTHER): Payer: Medicare HMO

## 2022-11-21 VITALS — Ht 71.0 in | Wt 280.0 lb

## 2022-11-21 DIAGNOSIS — Z0000 Encounter for general adult medical examination without abnormal findings: Secondary | ICD-10-CM

## 2022-11-21 NOTE — Patient Instructions (Signed)
Steven Hayden , Thank you for taking time to come for your Medicare Wellness Visit. I appreciate your ongoing commitment to your health goals. Please review the following plan we discussed and let me know if I can assist you in the future.   These are the goals we discussed:  Goals      Patient Stated     11/21/2022, wants to lose weight        This is a list of the screening recommended for you and due dates:  Health Maintenance  Topic Date Due   DTaP/Tdap/Td vaccine (1 - Tdap) Never done   Zoster (Shingles) Vaccine (1 of 2) Never done   Pneumonia Vaccine (2 of 2 - PPSV23 or PCV20) 08/03/2017   COVID-19 Vaccine (3 - Pfizer risk series) 09/23/2020   Colon Cancer Screening  10/28/2020   Flu Shot  12/24/2022*   Screening for Lung Cancer  10/06/2023   Medicare Annual Wellness Visit  11/21/2023   Hepatitis C Screening: USPSTF Recommendation to screen - Ages 18-71 yo.  Completed   HPV Vaccine  Aged Out  *Topic was postponed. The date shown is not the original due date.    Advanced directives: Please bring a copy of your POA (Power of Attorney) and/or Living Will to your next appointment.   Conditions/risks identified: smoking  Next appointment: Follow up in one year for your annual wellness visit.   Preventive Care 79 Years and Older, Male  Preventive care refers to lifestyle choices and visits with your health care provider that can promote health and wellness. What does preventive care include? A yearly physical exam. This is also called an annual well check. Dental exams once or twice a year. Routine eye exams. Ask your health care provider how often you should have your eyes checked. Personal lifestyle choices, including: Daily care of your teeth and gums. Regular physical activity. Eating a healthy diet. Avoiding tobacco and drug use. Limiting alcohol use. Practicing safe sex. Taking low doses of aspirin every day. Taking vitamin and mineral supplements as recommended by  your health care provider. What happens during an annual well check? The services and screenings done by your health care provider during your annual well check will depend on your age, overall health, lifestyle risk factors, and family history of disease. Counseling  Your health care provider may ask you questions about your: Alcohol use. Tobacco use. Drug use. Emotional well-being. Home and relationship well-being. Sexual activity. Eating habits. History of falls. Memory and ability to understand (cognition). Work and work Statistician. Screening  You may have the following tests or measurements: Height, weight, and BMI. Blood pressure. Lipid and cholesterol levels. These may be checked every 5 years, or more frequently if you are over 52 years old. Skin check. Lung cancer screening. You may have this screening every year starting at age 56 if you have a 30-pack-year history of smoking and currently smoke or have quit within the past 15 years. Fecal occult blood test (FOBT) of the stool. You may have this test every year starting at age 57. Flexible sigmoidoscopy or colonoscopy. You may have a sigmoidoscopy every 5 years or a colonoscopy every 10 years starting at age 31. Prostate cancer screening. Recommendations will vary depending on your family history and other risks. Hepatitis C blood test. Hepatitis B blood test. Sexually transmitted disease (STD) testing. Diabetes screening. This is done by checking your blood sugar (glucose) after you have not eaten for a while (fasting). You may have  this done every 1-3 years. Abdominal aortic aneurysm (AAA) screening. You may need this if you are a current or former smoker. Osteoporosis. You may be screened starting at age 22 if you are at high risk. Talk with your health care provider about your test results, treatment options, and if necessary, the need for more tests. Vaccines  Your health care provider may recommend certain vaccines,  such as: Influenza vaccine. This is recommended every year. Tetanus, diphtheria, and acellular pertussis (Tdap, Td) vaccine. You may need a Td booster every 10 years. Zoster vaccine. You may need this after age 29. Pneumococcal 13-valent conjugate (PCV13) vaccine. One dose is recommended after age 73. Pneumococcal polysaccharide (PPSV23) vaccine. One dose is recommended after age 2. Talk to your health care provider about which screenings and vaccines you need and how often you need them. This information is not intended to replace advice given to you by your health care provider. Make sure you discuss any questions you have with your health care provider. Document Released: 09/17/2015 Document Revised: 05/10/2016 Document Reviewed: 06/22/2015 Elsevier Interactive Patient Education  2017 [place] Prevention in the Home Falls can cause injuries. They can happen to people of all ages. There are many things you can do to make your home safe and to help prevent falls. What can I do on the outside of my home? Regularly fix the edges of walkways and driveways and fix any cracks. Remove anything that might make you trip as you walk through a door, such as a raised step or threshold. Trim any bushes or trees on the path to your home. Use bright outdoor lighting. Clear any walking paths of anything that might make someone trip, such as rocks or tools. Regularly check to see if handrails are loose or broken. Make sure that both sides of any steps have handrails. Any raised decks and porches should have guardrails on the edges. Have any leaves, snow, or ice cleared regularly. Use sand or salt on walking paths during winter. Clean up any spills in your garage right away. This includes oil or grease spills. What can I do in the bathroom? Use night lights. Install grab bars by the toilet and in the tub and shower. Do not use towel bars as grab bars. Use non-skid mats or decals in the tub or  shower. If you need to sit down in the shower, use a plastic, non-slip stool. Keep the floor dry. Clean up any water that spills on the floor as soon as it happens. Remove soap buildup in the tub or shower regularly. Attach bath mats securely with double-sided non-slip rug tape. Do not have throw rugs and other things on the floor that can make you trip. What can I do in the bedroom? Use night lights. Make sure that you have a light by your bed that is easy to reach. Do not use any sheets or blankets that are too big for your bed. They should not hang down onto the floor. Have a firm chair that has side arms. You can use this for support while you get dressed. Do not have throw rugs and other things on the floor that can make you trip. What can I do in the kitchen? Clean up any spills right away. Avoid walking on wet floors. Keep items that you use a lot in easy-to-reach places. If you need to reach something above you, use a strong step stool that has a grab bar. Keep electrical cords out  of the way. Do not use floor polish or wax that makes floors slippery. If you must use wax, use non-skid floor wax. Do not have throw rugs and other things on the floor that can make you trip. What can I do with my stairs? Do not leave any items on the stairs. Make sure that there are handrails on both sides of the stairs and use them. Fix handrails that are broken or loose. Make sure that handrails are as long as the stairways. Check any carpeting to make sure that it is firmly attached to the stairs. Fix any carpet that is loose or worn. Avoid having throw rugs at the top or bottom of the stairs. If you do have throw rugs, attach them to the floor with carpet tape. Make sure that you have a light switch at the top of the stairs and the bottom of the stairs. If you do not have them, ask someone to add them for you. What else can I do to help prevent falls? Wear shoes that: Do not have high heels. Have  rubber bottoms. Are comfortable and fit you well. Are closed at the toe. Do not wear sandals. If you use a stepladder: Make sure that it is fully opened. Do not climb a closed stepladder. Make sure that both sides of the stepladder are locked into place. Ask someone to hold it for you, if possible. Clearly mark and make sure that you can see: Any grab bars or handrails. First and last steps. Where the edge of each step is. Use tools that help you move around (mobility aids) if they are needed. These include: Canes. Walkers. Scooters. Crutches. Turn on the lights when you go into a dark area. Replace any light bulbs as soon as they burn out. Set up your furniture so you have a clear path. Avoid moving your furniture around. If any of your floors are uneven, fix them. If there are any pets around you, be aware of where they are. Review your medicines with your doctor. Some medicines can make you feel dizzy. This can increase your chance of falling. Ask your doctor what other things that you can do to help prevent falls. This information is not intended to replace advice given to you by your health care provider. Make sure you discuss any questions you have with your health care provider. Document Released: 06/17/2009 Document Revised: 01/27/2016 Document Reviewed: 09/25/2014 Elsevier Interactive Patient Education  2017 [employer].

## 2022-11-21 NOTE — Progress Notes (Signed)
I connected with  Sheilah Pigeon on 11/21/22 by a audio enabled telemedicine application and verified that I am speaking with the correct person using two identifiers.  Patient Location: Home  Provider Location: Office/Clinic  I discussed the limitations of evaluation and management by telemedicine. The patient expressed understanding and agreed to proceed.  Subjective:   Steven Hayden is a 72 y.o. male who presents for Medicare Annual/Subsequent preventive examination.  Patient Medicare AWV questionnaire was completed by the patient on 11/20/2022; I have confirmed that all information answered by patient is correct and no changes since this date.     Review of Systems     Cardiac Risk Factors include: advanced age (>32men, >64 women);dyslipidemia;hypertension;male gender;obesity (BMI >30kg/m2);smoking/ tobacco exposure     Objective:    Today's Vitals   11/21/22 1056  Weight: 280 lb (127 kg)  Height: 5\' 11"  (1.803 m)   Body mass index is 39.05 kg/m.     11/21/2022   11:06 AM 08/11/2021    1:32 PM 05/13/2018   11:30 AM 06/30/2015    2:01 PM 05/17/2014   12:40 PM 05/17/2014   11:12 AM  Advanced Directives  Does Patient Have a Medical Advance Directive? Yes No No No No No  Type of Paramedic of [place];Living will       Copy of [place] in Chart? No - copy requested       Would patient like information on creating a medical advance directive?   No - Patient declined  No - patient declined information     Current Medications (verified) Outpatient Encounter Medications as of 11/21/2022  Medication Sig   albuterol (VENTOLIN HFA) 108 (90 Base) MCG/ACT inhaler Inhale 2 puffs into the lungs every 6 (six) hours as needed for wheezing or shortness of breath.   apixaban (ELIQUIS) 5 MG TABS tablet Take 1 tablet (5 mg total) by mouth 2 (two) times daily.   busPIRone (BUSPAR) 5 MG tablet Take 1 tablet (5 mg total) by mouth 2 (two) times  daily.   cholecalciferol (VITAMIN D3) 25 MCG (1000 UNIT) tablet Take 1 tablet (1,000 Units total) by mouth daily. 1 tablet   Dermatological Products, Misc. [facility]) lotion apply to body daily for maintenance   FLUoxetine (PROZAC) 20 MG tablet Take 1/2 tab by mouth every morning for 4 days then increase to 1 tab by mouth every morning. For itching and energy.   Fluticasone-Umeclidin-Vilant (TRELEGY ELLIPTA) 200-62.5-25 MCG/ACT AEPB Inhale 1 puff into the lungs daily.   gabapentin (NEURONTIN) 300 MG capsule Take 1 capsule (300 mg total) by mouth 3 (three) times daily.   hydrOXYzine (ATARAX) 10 MG tablet take 1-2 pills po nightly for itching   isoniazid (NYDRAZID) 300 MG tablet Take 300 mg by mouth daily.   ketoconazole (NIZORAL) 2 % cream Apply 1 Application topically 2 (two) times daily. Mix 1" of cream with 1" of triamcinolone and apply to rash on back twice a day for two weeks then break for 3 days then repeat until rash improves.   ketoconazole (NIZORAL) 2 % shampoo Use as body wash over entire body twice a week I the shower. Try to let it stay on the skin for a few minutes before rinsing.   lisinopril (ZESTRIL) 20 MG tablet Take 1 tablet (20 mg total) by mouth daily.   MAGNESIUM MALATE PO 4050 mg   metoprolol succinate (TOPROL XL) 25 MG 24 hr tablet Take 1 tablet (25 mg total) by  mouth daily.   rosuvastatin (CRESTOR) 5 MG tablet TAKE ONE TABLET BY MOUTH EVERY OTHER DAY   triamcinolone cream (KENALOG) 0.5 % Apply 1 Application topically 2 (two) times daily. Mix 1" of cream with 1" of ketoconazole and apply to rash on back twice a day for two weeks then break for 3 days then repeat until rash improves.   diphenhydrAMINE HCl (BENADRYL ALLERGY PO) Take by mouth. (Patient not taking: Reported on 11/21/2022)   No facility-administered encounter medications on file as of 11/21/2022.    Allergies (verified) Codeine phosphate, Dilaudid [hydromorphone], and Pravastatin sodium   History: Past Medical  History:  Diagnosis Date   AAA (abdominal aortic aneurysm) (HCC)    ADD (attention deficit disorder)    Atrial fibrillation (HCC)    Bipolar affective disorder (HCC)    COPD (chronic obstructive pulmonary disease) (HCC)    Hyperlipidemia    Hypertension    Obesity (BMI 30-39.9) 04/15/2020   Prediabetes    Hgb A1c 6.4% August 2021   Schizophrenia [facility])    Statin declined 04/16/2020   States he will not take a statin. Has taken them in past.    Tobacco abuse    Past Surgical History:  Procedure Laterality Date   ABDOMINAL AORTIC ANEURYSM REPAIR     ABDOMINAL AORTIC ENDOVASCULAR STENT GRAFT N/A 05/18/2014   Procedure: ABDOMINAL AORTIC ENDOVASCULAR STENT GRAFT;  Surgeon: Conrad Eyota, MD;  Location: [facility] OR;  Service: Vascular;  Laterality: N/A;   cataract Bilateral    08/2022 and 09/2022   KNEE SURGERY     bilateral   Family History  Problem Relation Age of Onset   Hypertension Brother    Hyperlipidemia Brother    Hypertension Father    Hyperlipidemia Father    Cancer Father    Social History   Socioeconomic History   Marital status: Divorced    Spouse name: Not on file   Number of children: 1   Years of education: Not on file   Highest education level: Not on file  Occupational History   Not on file  Tobacco Use   Smoking status: Every Day    Packs/day: 2.00    Years: 50.00    Additional pack years: 0.00    Total pack years: 100.00    Types: Cigarettes    Passive exposure: Never   Smokeless tobacco: Never   Tobacco comments:    currently smoking 1ppd as of 10/07/20  Vaping Use   Vaping Use: Never used  Substance and Sexual Activity   Alcohol use: Yes    Alcohol/week: 20.0 standard drinks of alcohol    Types: 20 Shots of liquor per week    Comment: 3 shots every other day   Drug use: No   Sexual activity: Not on file  Other Topics Concern   Not on file  Social History Narrative   Not on file   Social Determinants of Health   Financial Resource Strain: Low  Risk  (11/20/2022)   Overall Financial Resource Strain (CARDIA)    Difficulty of Paying Living Expenses: Not hard at all  Food Insecurity: No Food Insecurity (11/21/2022)   Hunger Vital Sign    Worried About Running Out of Food in the Last Year: Never true    Ran Out of Food in the Last Year: Never true  Transportation Needs: No Transportation Needs (11/20/2022)   PRAPARE - Hydrologist (Medical): No    Lack of Transportation (Non-Medical): No  Physical Activity: Inactive (11/20/2022)   Exercise Vital Sign    Days of Exercise per Week: 0 days    Minutes of Exercise per Session: 0 min  Stress: No Stress Concern Present (11/20/2022)   [place]    Feeling of Stress : Only a little  Social Connections: Unknown (11/20/2022)   Social Connection and Isolation Panel [NHANES]    Frequency of Communication with Friends and Family: More than three times a week    Frequency of Social Gatherings with Friends and Family: Twice a week    Attends Religious Services: Not on Advertising copywriter or Organizations: Yes    Attends Archivist Meetings: Never    Marital Status: Divorced    Tobacco Counseling Ready to quit: Yes Counseling given: Not Answered Tobacco comments: currently smoking 1ppd as of 10/07/20   Clinical Intake:  Pre-visit preparation completed: Yes  Pain : No/denies pain     Nutritional Status: BMI > 30  Obese Nutritional Risks: None Diabetes: No  How often do you need to have someone help you when you read instructions, pamphlets, or other written materials from your doctor or pharmacy?: 3 - Sometimes  Diabetic? no  Interpreter Needed?: No  Information entered by :: NAllen LPN   Activities of Daily Living    11/21/2022   11:09 AM 11/20/2022    8:36 PM  In your present state of health, do you have any difficulty performing the following activities:   Hearing? 0 0  Vision? 0 0  Difficulty concentrating or making decisions? 1 1  Walking or climbing stairs? 1 1  Dressing or bathing? 0 1  Doing errands, shopping? 0 0  Preparing Food and eating ? N N  Using the Toilet? N N  In the past six months, have you accidently leaked urine? N N  Do you have problems with loss of bowel control? N N  Managing your Medications? N N  Managing your Finances? N Y  Housekeeping or managing your Housekeeping? N Y    Patient Care Team: Early, Coralee Pesa, NP as PCP - General (Nurse Practitioner) Garner Nash, DO as Consulting Physician (Pulmonary Disease) Ashley Royalty, PA (Physician Assistant)  Indicate any recent Medical Services you may have received from other than [facility] providers in the past year (date may be approximate).     Assessment:   This is a routine wellness examination for Ernest.  Hearing/Vision screen Vision Screening - Comments:: No regular eye exams,  Dietary issues and exercise activities discussed: Current Exercise Habits: The patient does not participate in regular exercise at present   Goals Addressed             This Visit's Progress    Patient Stated       11/21/2022, wants to lose weight       Depression Screen    11/21/2022   11:07 AM 07/25/2022    8:41 AM 11/18/2021    3:24 PM 11/15/2021   10:18 AM 02/03/2021    3:56 PM 04/15/2020    9:56 AM 12/15/2019   10:26 AM  PHQ 2/9 Scores  PHQ - 2 Score 6 0 2 2 3  0 0  PHQ- 9 Score 9  8 5 10       Fall Risk    11/20/2022    8:36 PM 07/25/2022    8:41 AM 11/15/2021   10:17 AM 02/03/2021    3:56  PM 06/22/2020    1:06 PM  [place] in the past year? 1 0 1 0 0  Number falls in past yr: 1 0 0 0   Injury with Fall? 1 0 1 0   Comment   Hurt right knee    Risk for fall due to : Medication side effect;History of fall(s);Impaired balance/gait;Impaired mobility No Fall Risks No Fall Risks No Fall Risks   Follow up Falls prevention discussed;Education  provided;Falls evaluation completed Falls evaluation completed Education provided;Falls evaluation completed Falls evaluation completed     FALL RISK PREVENTION PERTAINING TO THE HOME:  Any stairs in or around the home? Yes  If so, are there any without handrails? No  Home free of loose throw rugs in walkways, pet beds, electrical cords, etc? Yes  Adequate lighting in your home to reduce risk of falls? Yes   ASSISTIVE DEVICES UTILIZED TO PREVENT FALLS:  Life alert? No  Use of a cane, walker or w/c? Yes  Grab bars in the bathroom? No  Shower chair or bench in shower? Yes  Elevated toilet seat or a handicapped toilet? Yes   TIMED UP AND GO:  Was the test performed? No .      Cognitive Function:        11/21/2022   11:10 AM  6CIT Screen  What Year? 0 points  What month? 0 points  What time? 0 points  Count back from 20 0 points  Months in reverse 0 points  Repeat phrase 2 points  Total Score 2 points    Immunizations Immunization History  Administered Date(s) Administered   Influenza Whole 06/07/2010   Influenza,inj,Quad PF,6+ Mos 06/22/2014, 05/29/2016, 06/08/2017, 09/13/2018   Influenza-Unspecified 09/16/2019   PFIZER(Purple Top)SARS-COV-2 Vaccination 11/08/2019, 08/26/2020   Pneumococcal Conjugate-13 06/08/2017    TDAP status: Due, Education has been provided regarding the importance of this vaccine. Advised may receive this vaccine at local pharmacy or Health Dept. Aware to provide a copy of the vaccination record if obtained from local pharmacy or Health Dept. Verbalized acceptance and understanding.  Flu Vaccine status: Due, Education has been provided regarding the importance of this vaccine. Advised may receive this vaccine at local pharmacy or Health Dept. Aware to provide a copy of the vaccination record if obtained from local pharmacy or Health Dept. Verbalized acceptance and understanding.  Pneumococcal vaccine status: Due, Education has been provided  regarding the importance of this vaccine. Advised may receive this vaccine at local pharmacy or Health Dept. Aware to provide a copy of the vaccination record if obtained from local pharmacy or Health Dept. Verbalized acceptance and understanding.  Covid-19 vaccine status: Completed vaccines  Qualifies for Shingles Vaccine? Yes   Zostavax completed No   Shingrix Completed?: No.    Education has been provided regarding the importance of this vaccine. Patient has been advised to call insurance company to determine out of pocket expense if they have not yet received this vaccine. Advised may also receive vaccine at local pharmacy or Health Dept. Verbalized acceptance and understanding.  Screening Tests Health Maintenance  Topic Date Due   DTaP/Tdap/Td (1 - Tdap) Never done   Zoster Vaccines- Shingrix (1 of 2) Never done   Pneumonia Vaccine 20+ Years old (2 of 2 - PPSV23 or PCV20) 08/03/2017   COVID-19 Vaccine (3 - Pfizer risk series) 09/23/2020   COLONOSCOPY (Pts 45-56yrs Insurance coverage will need to be confirmed)  10/28/2020   Medicare Annual Wellness (AWV)  11/16/2022  INFLUENZA VACCINE  12/24/2022 (Originally 04/04/2022)   Lung Cancer Screening  10/06/2023   Hepatitis C Screening  Completed   HPV VACCINES  Aged Out    Health Maintenance  Health Maintenance Due  Topic Date Due   DTaP/Tdap/Td (1 - Tdap) Never done   Zoster Vaccines- Shingrix (1 of 2) Never done   Pneumonia Vaccine 30+ Years old (2 of 2 - PPSV23 or PCV20) 08/03/2017   COVID-19 Vaccine (3 - Pfizer risk series) 09/23/2020   COLONOSCOPY (Pts 45-52yrs Insurance coverage will need to be confirmed)  10/28/2020   Medicare Annual Wellness (AWV)  11/16/2022    Colorectal cancer screening: has cologuard kit  Lung Cancer Screening: (Low Dose CT Chest recommended if Age 74-80 years, 30 pack-year currently smoking OR have quit w/in 15years.) does qualify.   Lung Cancer Screening Referral:  CT scan 10/05/2022  Additional  Screening:  Hepatitis C Screening: does qualify; Completed 10/26/2022  Vision Screening: Recommended annual ophthalmology exams for early detection of glaucoma and other disorders of the eye. Is the patient up to date with their annual eye exam?  No  Who is the provider or what is the name of the office in which the patient attends annual eye exams? none If pt is not established with a provider, would they like to be referred to a provider to establish care? No .   Dental Screening: Recommended annual dental exams for proper oral hygiene  Community Resource Referral / Chronic Care Management: CRR required this visit?  No   CCM required this visit?  No      Plan:     I have personally reviewed and noted the following in the patient's chart:   Medical and social history Use of alcohol, tobacco or illicit drugs  Current medications and supplements including opioid prescriptions. Patient is not currently taking opioid prescriptions. Functional ability and status Nutritional status Physical activity Advanced directives List of other physicians Hospitalizations, surgeries, and ER visits in previous 12 months Vitals Screenings to include cognitive, depression, and falls Referrals and appointments  In addition, I have reviewed and discussed with patient certain preventive protocols, quality metrics, and best practice recommendations. A written personalized care plan for preventive services as well as general preventive health recommendations were provided to patient.     Kellie Simmering, LPN   [id number]   Nurse Notes: none  Due to this being a virtual visit, the after visit summary with patients personalized plan was offered to patient via mail or my-chart.  Patient would like to access on my-chart

## 2022-11-24 NOTE — Addendum Note (Signed)
Addended by: Angle Karel, Clarise Cruz E on: 11/24/2022 07:01 PM   Modules accepted: Orders

## 2022-11-28 ENCOUNTER — Other Ambulatory Visit: Payer: Self-pay | Admitting: Physician Assistant

## 2022-11-28 DIAGNOSIS — I714 Abdominal aortic aneurysm, without rupture, unspecified: Secondary | ICD-10-CM

## 2022-11-28 DIAGNOSIS — J449 Chronic obstructive pulmonary disease, unspecified: Secondary | ICD-10-CM

## 2022-12-05 ENCOUNTER — Ambulatory Visit (INDEPENDENT_AMBULATORY_CARE_PROVIDER_SITE_OTHER): Payer: Medicare HMO | Admitting: Nurse Practitioner

## 2022-12-05 ENCOUNTER — Encounter: Payer: Self-pay | Admitting: Nurse Practitioner

## 2022-12-05 VITALS — BP 130/60 | HR 47 | Wt 291.0 lb

## 2022-12-05 DIAGNOSIS — L508 Other urticaria: Secondary | ICD-10-CM

## 2022-12-05 DIAGNOSIS — L299 Pruritus, unspecified: Secondary | ICD-10-CM

## 2022-12-05 DIAGNOSIS — N189 Chronic kidney disease, unspecified: Secondary | ICD-10-CM

## 2022-12-05 DIAGNOSIS — Z7901 Long term (current) use of anticoagulants: Secondary | ICD-10-CM

## 2022-12-05 DIAGNOSIS — R09A2 Foreign body sensation, throat: Secondary | ICD-10-CM

## 2022-12-05 DIAGNOSIS — I714 Abdominal aortic aneurysm, without rupture, unspecified: Secondary | ICD-10-CM | POA: Diagnosis not present

## 2022-12-05 DIAGNOSIS — L853 Xerosis cutis: Secondary | ICD-10-CM | POA: Diagnosis not present

## 2022-12-05 DIAGNOSIS — R635 Abnormal weight gain: Secondary | ICD-10-CM | POA: Diagnosis not present

## 2022-12-05 DIAGNOSIS — R3911 Hesitancy of micturition: Secondary | ICD-10-CM

## 2022-12-05 DIAGNOSIS — J449 Chronic obstructive pulmonary disease, unspecified: Secondary | ICD-10-CM | POA: Diagnosis not present

## 2022-12-05 DIAGNOSIS — F988 Other specified behavioral and emotional disorders with onset usually occurring in childhood and adolescence: Secondary | ICD-10-CM

## 2022-12-05 DIAGNOSIS — J411 Mucopurulent chronic bronchitis: Secondary | ICD-10-CM

## 2022-12-05 DIAGNOSIS — Z6837 Body mass index (BMI) 37.0-37.9, adult: Secondary | ICD-10-CM

## 2022-12-05 HISTORY — DX: Foreign body sensation, throat: R09.A2

## 2022-12-05 MED ORDER — TACROLIMUS 0.1 % EX OINT: TOPICAL_OINTMENT | Freq: Two times a day (BID) | CUTANEOUS | 0 refills | Status: DC

## 2022-12-05 MED ORDER — APIXABAN 5 MG PO TABS: 5.0000 mg | ORAL_TABLET | Freq: Two times a day (BID) | ORAL | 3 refills | Status: DC

## 2022-12-05 MED ORDER — GABAPENTIN 300 MG PO CAPS
ORAL_CAPSULE | ORAL | 11 refills | Status: DC
Start: 2022-12-05 — End: 2023-02-02

## 2022-12-05 NOTE — Progress Notes (Signed)
[name], DNP, AGNP-c [facility] [address] [place], [place] [address] [phone]  Subjective:   Steven Hayden is a 72 y.o. male presents to day for evaluation of: Steven Hayden presents today with multiple health concerns, including extremely dry skin, difficulty swallowing, rapid weight gain, and urinary issues. He is accompanied by no one.  The patient describes his skin as "alligator skin" and reports no improvement despite daily application of Aveeno lotion. He expresses frustration with the chronic nature of this condition. He tells me that he moved into a new home several months ago and there is an issue with the shower, therefore, he has been avoiding bathing for the last several months. He did shower today.   Steven Hayden is grateful for the relief from itching provided by gabapentin, which was previously a constant discomfort. However, he reports difficulty managing the daytime dosage, as taking two pills induces a feeling akin to intoxication. He plans to reduce the daytime dosage to one pill to prevent the onset of itching.  He also reports a sensation of throat constriction that has been worsening over the past six months, with difficulty swallowing small items like grapes without water. He denies pain, fever, or chills associated with this symptom. He recalls a significant choking incident approximately one year ago, suggesting a possible esophageal stricture, and expresses interest in undergoing an examination for this issue.  Steven Hayden inquires about the renewal of his Eliquis prescription, as he is currently out of the medication.   He is concerned about his rapid weight gain, noting an increase from 280 to 291 pounds in a short period, and acknowledges swelling in his feet and legs, which he attributes to fluid retention. He reports urinary issues, experiencing short spurts instead of a steady flow. He has significant nocturia reported, as well.   Mr.  Hayden admits to consuming six shots of vodka weekly and has reduced his cigarette smoking from two packs to 15 cigarettes a day.  He asks about restarting Adderall as he felt this helped him "get up and going" during the day. It was discontinued by his previous provider. He feels this was due to heart reasons.    PMH, Medications, and Allergies reviewed and updated in chart as appropriate.   ROS negative except for what is listed in HPI. Objective:  BP 130/60   Pulse (!) 47   Wt 291 lb (132 kg)   SpO2 94%   BMI 40.59 kg/m  Physical Exam Vitals and nursing note reviewed.  Constitutional:      Appearance: Normal appearance. He is obese.  HENT:     Head: Normocephalic.     Nose: Nose normal.     Mouth/Throat:     Mouth: Mucous membranes are moist.     Pharynx: Oropharynx is clear.  Eyes:     Pupils: Pupils are equal, round, and reactive to light.  Musculoskeletal:     Cervical back: Normal range of motion.  Skin:    General: Skin is dry.     Capillary Refill: Capillary refill takes less than 2 seconds.     Findings: Rash present. Rash is scaling.  Neurological:     Mental Status: He is alert.         Assessment & Plan:   Problem List Items Addressed This Visit     Attention deficit disorder    Discussion about stimulant medication in the setting of chronic cardiac conditions and the increased risk to death. At  this time I do not feel the benefits are worth the risks due to the significance of the side effects of the medication given a history of AAA, afib, HTN, COPD, and OSA.      COPD (chronic obstructive pulmonary disease)    Worsening breathing difficulties reported by the patient with COPD. Interested in alternative treatments to Trelegy. History of treated latent tuberculosis. Plan: - Trial Breztri with instructions for use at bedtime and morning if needed. - Maintain gabapentin therapy, adjust daytime dosing. - Encourage positioning techniques for breath  relief.      Current use of long term anticoagulation    - Refill Eliquis as per patient request.      Xerosis of skin    The patient presents with severe, chronic dry skin, unresponsive to over-the-counter moisturizers. Microscopic examination suggests a secondary bacterial infection. Lack of regular bathing may have exacerbated the condition. Plan: - Prescribe Tacrolimus ointment to be mixed with lotion and applied before bed. - Recommend gentle physical exfoliation with a wet washcloth. - Continue gabapentin for itch relief, adjust daytime dosage for side effects.      Relevant Medications   tacrolimus (PROTOPIC) 0.1 % ointment   Globus sensation - Primary    Steven Hayden reports difficulty swallowing, indicative of a possible esophageal stricture, worsening over six months. No pain, fever, or significant weight loss reported. History includes a choking incident. Plan: - Referral to gastroenterology for endoscopic evaluation and potential dilation.      Relevant Orders   Ambulatory referral to Gastroenterology   Weight gain    Significant weight gain and swelling in legs and feet observed, suggesting fluid retention. Decreased urinary output and difficulty urinating raise concerns about prostate health. No specific cause identified.  Plan: - Order PSA blood test for prostate evaluation. - Further assessment for fluid retention cause and management.      Relevant Orders   Hemoglobin A1c (Completed)   Chronic pruritus   AAA (abdominal aortic aneurysm) without rupture   Relevant Medications   apixaban (ELIQUIS) 5 MG TABS tablet   Chronic kidney disease   Relevant Orders   Brain natriuretic peptide (Completed)   Body mass index (BMI) of 37.0 to 37.9 in adult   Other Visit Diagnoses     Chronic urticaria       Relevant Medications   gabapentin (NEURONTIN) 300 MG capsule   Urinary hesitancy       Relevant Orders   PSA Total (Reflex To Free) (Completed)         [name], DNP, AGNP-c 12/11/2022  8:29 PM   Time: 51 minutes, >50% spent counseling, care coordination, chart review, and documentation.    History, Medications, Surgery, SDOH, and Family History reviewed and updated as appropriate.

## 2022-12-06 LAB — BRAIN NATRIURETIC PEPTIDE: BNP: 25.3 pg/mL (ref 0.0–100.0)

## 2022-12-06 LAB — HEMOGLOBIN A1C
Est. average glucose Bld gHb Est-mCnc: 123 mg/dL
Hgb A1c MFr Bld: 5.9 % — ABNORMAL HIGH (ref 4.8–5.6)

## 2022-12-06 LAB — PSA TOTAL (REFLEX TO FREE): Prostate Specific Ag, Serum: 0.7 ng/mL (ref 0.0–4.0)

## 2022-12-11 ENCOUNTER — Encounter: Payer: Self-pay | Admitting: Nurse Practitioner

## 2022-12-11 DIAGNOSIS — R635 Abnormal weight gain: Secondary | ICD-10-CM | POA: Insufficient documentation

## 2022-12-11 HISTORY — DX: Abnormal weight gain: R63.5

## 2022-12-11 NOTE — Assessment & Plan Note (Signed)
The patient presents with severe, chronic dry skin, unresponsive to over-the-counter moisturizers. Microscopic examination suggests a secondary bacterial infection. Lack of regular bathing may have exacerbated the condition. Plan: - Prescribe Tacrolimus ointment to be mixed with lotion and applied before bed. - Recommend gentle physical exfoliation with a wet washcloth. - Continue gabapentin for itch relief, adjust daytime dosage for side effects.

## 2022-12-11 NOTE — Assessment & Plan Note (Signed)
-   Refill Eliquis as per patient request.

## 2022-12-11 NOTE — Assessment & Plan Note (Signed)
Worsening breathing difficulties reported by the patient with COPD. Interested in alternative treatments to Trelegy. History of treated latent tuberculosis. Plan: - Trial Breztri with instructions for use at bedtime and morning if needed. - Maintain gabapentin therapy, adjust daytime dosing. - Encourage positioning techniques for breath relief.

## 2022-12-11 NOTE — Assessment & Plan Note (Signed)
Significant weight gain and swelling in legs and feet observed, suggesting fluid retention. Decreased urinary output and difficulty urinating raise concerns about prostate health. No specific cause identified.  Plan: - Order PSA blood test for prostate evaluation. - Further assessment for fluid retention cause and management.

## 2022-12-11 NOTE — Assessment & Plan Note (Signed)
Discussion about stimulant medication in the setting of chronic cardiac conditions and the increased risk to death. At this time I do not feel the benefits are worth the risks due to the significance of the side effects of the medication given a history of AAA, afib, HTN, COPD, and OSA.

## 2022-12-11 NOTE — Assessment & Plan Note (Signed)
Steven Hayden reports difficulty swallowing, indicative of a possible esophageal stricture, worsening over six months. No pain, fever, or significant weight loss reported. History includes a choking incident. Plan: - Referral to gastroenterology for endoscopic evaluation and potential dilation.

## 2022-12-28 MED ORDER — AMITRIPTYLINE HCL 50 MG PO TABS
50.0000 mg | ORAL_TABLET | Freq: Every day | ORAL | 1 refills | Status: DC
Start: 2022-12-28 — End: 2023-02-08

## 2022-12-28 NOTE — Addendum Note (Signed)
Addended by: Jarrah Babich, [place] Dec E on: 12/28/2022 07:38 AM   Modules accepted: Orders

## 2023-01-11 ENCOUNTER — Encounter: Payer: Self-pay | Admitting: Physician Assistant

## 2023-01-26 ENCOUNTER — Other Ambulatory Visit: Payer: Self-pay

## 2023-01-26 ENCOUNTER — Telehealth: Payer: Self-pay | Admitting: Nurse Practitioner

## 2023-01-26 DIAGNOSIS — L84 Corns and callosities: Secondary | ICD-10-CM | POA: Diagnosis not present

## 2023-01-26 DIAGNOSIS — I714 Abdominal aortic aneurysm, without rupture, unspecified: Secondary | ICD-10-CM

## 2023-01-26 DIAGNOSIS — N189 Chronic kidney disease, unspecified: Secondary | ICD-10-CM

## 2023-01-26 DIAGNOSIS — Z6837 Body mass index (BMI) 37.0-37.9, adult: Secondary | ICD-10-CM

## 2023-01-26 DIAGNOSIS — L503 Dermatographic urticaria: Secondary | ICD-10-CM | POA: Diagnosis not present

## 2023-01-26 DIAGNOSIS — F419 Anxiety disorder, unspecified: Secondary | ICD-10-CM

## 2023-01-26 DIAGNOSIS — L508 Other urticaria: Secondary | ICD-10-CM

## 2023-01-26 DIAGNOSIS — L02224 Furuncle of groin: Secondary | ICD-10-CM | POA: Diagnosis not present

## 2023-01-26 DIAGNOSIS — I10 Essential (primary) hypertension: Secondary | ICD-10-CM

## 2023-01-26 DIAGNOSIS — L2089 Other atopic dermatitis: Secondary | ICD-10-CM | POA: Diagnosis not present

## 2023-01-26 DIAGNOSIS — J449 Chronic obstructive pulmonary disease, unspecified: Secondary | ICD-10-CM

## 2023-01-26 DIAGNOSIS — L299 Pruritus, unspecified: Secondary | ICD-10-CM

## 2023-01-26 DIAGNOSIS — E782 Mixed hyperlipidemia: Secondary | ICD-10-CM

## 2023-01-26 DIAGNOSIS — L02426 Furuncle of left lower limb: Secondary | ICD-10-CM | POA: Diagnosis not present

## 2023-01-26 DIAGNOSIS — L298 Other pruritus: Secondary | ICD-10-CM | POA: Diagnosis not present

## 2023-01-26 MED ORDER — LISINOPRIL 20 MG PO TABS
20.0000 mg | ORAL_TABLET | Freq: Every day | ORAL | 3 refills | Status: DC
Start: 2023-01-26 — End: 2023-02-02

## 2023-01-26 MED ORDER — APIXABAN 5 MG PO TABS
5.0000 mg | ORAL_TABLET | Freq: Two times a day (BID) | ORAL | 3 refills | Status: DC
Start: 2023-01-26 — End: 2024-05-07

## 2023-01-26 MED ORDER — KETOCONAZOLE 2 % EX SHAM
MEDICATED_SHAMPOO | CUTANEOUS | 6 refills | Status: DC
Start: 2023-01-26 — End: 2023-07-17

## 2023-01-26 NOTE — Telephone Encounter (Signed)
Fax from [facility]  Lisinopril 20mg  Eliquis 5 mg triamcinolone  acetonide .5% topical cream Fluoxetine 20 mg Ketoconazole 2 % shampoo Gabapentin 300 mg

## 2023-02-01 ENCOUNTER — Telehealth: Payer: Self-pay | Admitting: Nurse Practitioner

## 2023-02-01 DIAGNOSIS — I10 Essential (primary) hypertension: Secondary | ICD-10-CM

## 2023-02-01 DIAGNOSIS — L299 Pruritus, unspecified: Secondary | ICD-10-CM

## 2023-02-01 DIAGNOSIS — J449 Chronic obstructive pulmonary disease, unspecified: Secondary | ICD-10-CM

## 2023-02-01 DIAGNOSIS — F988 Other specified behavioral and emotional disorders with onset usually occurring in childhood and adolescence: Secondary | ICD-10-CM

## 2023-02-01 DIAGNOSIS — L508 Other urticaria: Secondary | ICD-10-CM

## 2023-02-01 DIAGNOSIS — F419 Anxiety disorder, unspecified: Secondary | ICD-10-CM

## 2023-02-01 NOTE — Telephone Encounter (Signed)
Received a call from [facility] mail order [facility] requesting refills on pt's behalf. Pt needs gabapentin, prozac and Trelegy. Please send to [facility] mail order pharmacy.

## 2023-02-02 MED ORDER — LISINOPRIL 20 MG PO TABS: 20.0000 mg | ORAL_TABLET | Freq: Every day | ORAL | 3 refills | Status: DC

## 2023-02-02 MED ORDER — FLUOXETINE HCL 20 MG PO TABS: 20.0000 mg | ORAL_TABLET | Freq: Every day | ORAL | 3 refills | Status: DC

## 2023-02-02 MED ORDER — TRELEGY ELLIPTA 200-62.5-25 MCG/ACT IN AEPB: INHALATION_SPRAY | RESPIRATORY_TRACT | 11 refills | Status: DC

## 2023-02-02 MED ORDER — GABAPENTIN 300 MG PO CAPS
ORAL_CAPSULE | ORAL | 3 refills | Status: DC
Start: 2023-02-02 — End: 2024-01-09

## 2023-02-02 MED ORDER — BUSPIRONE HCL 5 MG PO TABS: 5.0000 mg | ORAL_TABLET | Freq: Two times a day (BID) | ORAL | 3 refills | Status: DC

## 2023-02-02 NOTE — Telephone Encounter (Signed)
Refills sent

## 2023-02-02 NOTE — Telephone Encounter (Signed)
[facility] called back & pt also needs Buspirone & Lisinopril to [facility]

## 2023-02-06 ENCOUNTER — Other Ambulatory Visit: Payer: Self-pay | Admitting: Nurse Practitioner

## 2023-02-06 ENCOUNTER — Telehealth: Payer: Self-pay | Admitting: Internal Medicine

## 2023-02-06 NOTE — Telephone Encounter (Signed)
Fax from [facility] pharmacy states that use of Fluoxetine 20mg , Gabapentin and Amitriptiyline may cause CNA depression/sedation. Are you ok for them to fill FLuoxtine 20mg  while on gabapentin and amitriptyline.   I will send form in your folder for you to review

## 2023-02-08 ENCOUNTER — Other Ambulatory Visit: Payer: Self-pay | Admitting: Nurse Practitioner

## 2023-02-08 NOTE — Telephone Encounter (Signed)
Please call Steven Hayden and let him know that I would like him to start tapering off of the amitriptyline at bedtime. Now that the itch is under control with the Fluoxetine and Gabapentin, we can start to stop the amitriptyline.   The amitriptyline with the other two medications can increase the risk of sedation so we don't want to stay on this combo long term.   I want him to start by taking amitriptyline every other night for 1 week then he can stop the medication. The medication has a long working time in the body, so this should not trigger the itch to come back.

## 2023-02-24 ENCOUNTER — Telehealth: Payer: Self-pay | Admitting: Nurse Practitioner

## 2023-02-24 NOTE — Telephone Encounter (Signed)
P.A. GABAPENTIN

## 2023-03-03 NOTE — Telephone Encounter (Signed)
P.A. approved til 09/04/23, sent mychart message  

## 2023-03-07 NOTE — Telephone Encounter (Signed)
Forwarding this to [facility]

## 2023-03-20 ENCOUNTER — Ambulatory Visit: Payer: Medicare HMO | Admitting: Physician Assistant

## 2023-03-20 ENCOUNTER — Encounter: Payer: Self-pay | Admitting: Physician Assistant

## 2023-03-20 VITALS — BP 120/70 | HR 76 | Ht 72.0 in | Wt 271.6 lb

## 2023-03-20 DIAGNOSIS — Z9981 Dependence on supplemental oxygen: Secondary | ICD-10-CM

## 2023-03-20 DIAGNOSIS — J449 Chronic obstructive pulmonary disease, unspecified: Secondary | ICD-10-CM | POA: Diagnosis not present

## 2023-03-20 DIAGNOSIS — I480 Paroxysmal atrial fibrillation: Secondary | ICD-10-CM | POA: Diagnosis not present

## 2023-03-20 DIAGNOSIS — Z7901 Long term (current) use of anticoagulants: Secondary | ICD-10-CM | POA: Diagnosis not present

## 2023-03-20 DIAGNOSIS — R1310 Dysphagia, unspecified: Secondary | ICD-10-CM

## 2023-03-20 MED ORDER — OMEPRAZOLE 40 MG PO CPDR: 40.0000 mg | DELAYED_RELEASE_CAPSULE | Freq: Every day | ORAL | 0 refills | Status: DC

## 2023-03-20 NOTE — Patient Instructions (Signed)
_____________________________________________________  If your blood pressure at your visit was 140/90 or greater, please contact your primary care physician to follow up on this.  _______________________________________________________  If you are age 71 or older, your body mass index should be between 23-30. Your Body mass index is 36.84 kg/m. If this is out of the aforementioned range listed, please consider follow up with your Primary Care Provider.  You have been scheduled for a Barium Esophogram at [facility] (1st floor of the hospital) on 04/06/23 at 900 am. Please arrive 30 minutes prior to your appointment for registration. Make certain not to have anything to eat or drink 3 hours prior to your test. If you need to reschedule for any reason, please contact radiology at [phone] to do so. __________________________________________________________________ A barium swallow is an examination that concentrates on views of the esophagus. This tends to be a double contrast exam (barium and two liquids which, when combined, create a gas to distend the wall of the oesophagus) or single contrast (non-ionic iodine based). The study is usually tailored to your symptoms so a good history is essential. Attention is paid during the study to the form, structure and configuration of the esophagus, looking for functional disorders (such as aspiration, dysphagia, achalasia, motility and reflux) EXAMINATION You may be asked to change into a gown, depending on the type of swallow being performed. A radiologist and radiographer will perform the procedure. The radiologist will advise you of the type of contrast selected for your procedure and direct you during the exam. You will be asked to stand, sit or lie in several different positions and to hold a small amount of fluid in your mouth before being asked to swallow while the imaging is performed .In some instances you may be asked to swallow barium  coated marshmallows to assess the motility of a solid food bolus. The exam can be recorded as a digital or video fluoroscopy procedure. POST PROCEDURE It will take 1-2 days for the barium to pass through your system. To facilitate this, it is important, unless otherwise directed, to increase your fluids for the next 24-48hrs and to resume your normal diet.  This test typically takes about 30 minutes to perform. __________________________________________________________________________________  _______________________________________________________ Steven Hayden have been scheduled for an endoscopy. Please follow written instructions given to you at your visit today.  If you use inhalers (even only as needed), please bring them with you on the day of your procedure.  If you take any of the following medications, they will need to be adjusted prior to your procedure:   DO NOT TAKE 7 DAYS PRIOR TO TEST- Trulicity (dulaglutide) Ozempic, Wegovy (semaglutide) Mounjaro (tirzepatide) Bydureon Bcise (exanatide extended release)  DO NOT TAKE 1 DAY PRIOR TO YOUR TEST Rybelsus (semaglutide) Adlyxin (lixisenatide) Victoza (liraglutide) Byetta (exanatide) ___________________________________________________________________________   Steven Hayden will be contaced by our office prior to your procedure for directions on holding your Eliquis.  If you do not hear from our office 1 week prior to your scheduled procedure, please call [phone] to discuss.   We have sent the following medications to your pharmacy for you to pick up at your convenience: Take Omeprazole 40 mg daily 30 minutes before breakfast.   The [facility] providers would like to encourage you to use [facility] to communicate with providers for non-urgent requests or questions.  Due to long hold times on the telephone, sending your provider a message by [facility] may be a faster and more efficient way to get a response.  Please allow 48 business hours for a  response.  Please remember that this is for non-urgent requests.   Due to recent changes in healthcare laws, you may see the results of your imaging and laboratory studies on MyChart before your provider has had a chance to review them.  We understand that in some cases there may be results that are confusing or concerning to you. Not all laboratory results come back in the same time frame and the provider may be waiting for multiple results in order to interpret others.  Please give [place] 48 hours in order for your provider to thoroughly review all the results before contacting the office for clarification of your results.

## 2023-03-20 NOTE — Progress Notes (Signed)
Chief Complaint: Dysphagia  HPI:    Steven Hayden is a 72 year old male with a past medical history as listed below including A-fib on Eliquis (05/22/2019 echo with LVEF 50-55%), bipolar disorder, COPD, schizophrenia and multiple others, previously known to Dr. Ewing Schlein at [facility], who was referred to me by Early, Sung Amabile, NP for a complaint of dysphagia.      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    10/26/2022 CBC, CMP, iron studies, TSH, CRP, RPR and Lyme disease all normal/negative.    12/05/2022 patient seen in clinic by PCP for globus sensation.  Discussed this is a sensation of throat constriction that have been worsening over the past 6 months with difficulty swallowing small items like grapes.    Today, the patient presents to clinic and tells me he has been having trouble swallowing for the past 2 to 3 years, in fact has had 2 distinct episodes of choking in which someone "saved him", the last was about 6 months ago.  Now he tells me it is worsening to the point where he can even choke on spit in his mouth.  Tells me he either coughs up what ever he is choking on or drinks it down.  Denies any heartburn or reflux symptoms.  No abdominal pain.  He has lost 10 pounds intentionally with diet and exercise.    Patient is on oxygen for his COPD in the evenings and does take Eliquis daily.    Denies fever, chills, weight loss or blood in his stool.  Past Medical History:  Diagnosis Date   AAA (abdominal aortic aneurysm) (HCC)    ADD (attention deficit disorder)    Atrial fibrillation (HCC)    Bipolar affective disorder (HCC)    COPD (chronic obstructive pulmonary disease) (HCC)    Hyperlipidemia    Hypertension    Lichenification 10/27/2022   Obesity (BMI 30-39.9) 04/15/2020   Prediabetes    Hgb A1c 6.4% August 2021   Schizophrenia [facility])    Statin declined 04/16/2020   States he will not take a statin. Has taken them  in past.    Tobacco abuse    TOBACCO USE 07/14/2008   Qualifier: Diagnosis of   By: Cato Mulligan MD, Bruce        Past Surgical History:  Procedure Laterality Date   ABDOMINAL AORTIC ANEURYSM REPAIR     ABDOMINAL AORTIC ENDOVASCULAR STENT GRAFT N/A 05/18/2014   Procedure: ABDOMINAL AORTIC ENDOVASCULAR STENT GRAFT;  Surgeon: Fransisco Hertz, MD;  Location: [facility] OR;  Service: Vascular;  Laterality: N/A;   cataract Bilateral    08/2022 and 09/2022   KNEE SURGERY     bilateral    Current Outpatient Medications  Medication Sig Dispense Refill   albuterol (VENTOLIN HFA) 108 (90 Base) MCG/ACT inhaler Inhale 2 puffs into the lungs every 6 (six) hours as needed for wheezing or shortness of breath. 18 g 2   apixaban (ELIQUIS) 5 MG TABS tablet Take 1 tablet (5 mg total) by mouth 2 (two) times daily. 180 tablet 3   busPIRone (BUSPAR) 5 MG tablet Take 1 tablet (5 mg total) by mouth 2 (two) times daily. 180 tablet 3   cholecalciferol (VITAMIN D3) 25 MCG (1000 UNIT) tablet Take 1 tablet (1,000 Units total) by mouth daily. 1 tablet 90 tablet 3   Dermatological Products, Misc. [facility]) lotion apply to body daily for maintenance     FLUoxetine (PROZAC) 20 MG tablet  Take 1 tablet (20 mg total) by mouth daily. For itching, mood, and energy. 90 tablet 3   Fluticasone-Umeclidin-Vilant (TRELEGY ELLIPTA) 200-62.5-25 MCG/ACT AEPB INHALE ONE PUFF BY MOUTH DAILY 60 each 11   gabapentin (NEURONTIN) 300 MG capsule Take 2-3 capsules twice a day. Take 4 capsules at bedtime. 900 capsule 3   hydrOXYzine (ATARAX) 10 MG tablet take 1-2 pills po nightly for itching 90 tablet 5   isoniazid (NYDRAZID) 300 MG tablet Take 300 mg by mouth daily.     ketoconazole (NIZORAL) 2 % cream Apply 1 Application topically 2 (two) times daily. Mix 1" of cream with 1" of triamcinolone and apply to rash on back twice a day for two weeks then break for 3 days then repeat until rash improves. 120 g 3   ketoconazole (NIZORAL) 2 % shampoo Use as body  wash over entire body twice a week I the shower. Try to let it stay on the skin for a few minutes before rinsing. 120 mL 6   lisinopril (ZESTRIL) 20 MG tablet Take 1 tablet (20 mg total) by mouth daily. 90 tablet 3   MAGNESIUM MALATE PO 4050 mg     metoprolol succinate (TOPROL XL) 25 MG 24 hr tablet Take 1 tablet (25 mg total) by mouth daily. 90 tablet 3   rosuvastatin (CRESTOR) 5 MG tablet TAKE ONE TABLET BY MOUTH EVERY OTHER DAY 45 tablet 1   SHINGRIX injection      tacrolimus (PROTOPIC) 0.1 % ointment Apply topically 2 (two) times daily. 100 g 0   triamcinolone cream (KENALOG) 0.5 % Apply 1 Application topically 2 (two) times daily. Mix 1" of cream with 1" of ketoconazole and apply to rash on back twice a day for two weeks then break for 3 days then repeat until rash improves. 120 g 3   No current facility-administered medications for this visit.    Allergies as of 03/20/2023 - Review Complete 12/05/2022  Allergen Reaction Noted   Codeine phosphate     Dilaudid [hydromorphone]  12/15/2019   Pravastatin sodium Other (See Comments) 11/12/2019    Family History  Problem Relation Age of Onset   Hypertension Brother    Hyperlipidemia Brother    Hypertension Father    Hyperlipidemia Father    Cancer Father     Social History   Socioeconomic History   Marital status: Divorced    Spouse name: Not on file   Number of children: 1   Years of education: Not on file   Highest education level: Not on file  Occupational History   Not on file  Tobacco Use   Smoking status: Every Day    Current packs/day: 0.50    Average packs/day: 0.5 packs/day for 50.0 years (25.0 ttl pk-yrs)    Types: Cigarettes    Passive exposure: Current   Smokeless tobacco: Never   Tobacco comments:    currently smoking 1ppd as of 10/07/20  Vaping Use   Vaping status: Never Used  Substance and Sexual Activity   Alcohol use: Yes    Alcohol/week: 6.0 standard drinks of alcohol    Types: 6 Shots of liquor per  week    Comment: 6 shots of liquor a week   Drug use: No   Sexual activity: Not Currently  Other Topics Concern   Not on file  Social History Narrative   Not on file   Social Determinants of Health   Financial Resource Strain: Low Risk  (11/20/2022)   Overall Financial Resource  Strain (CARDIA)    Difficulty of Paying Living Expenses: Not hard at all  Food Insecurity: No Food Insecurity (11/21/2022)   Hunger Vital Sign    Worried About Running Out of Food in the Last Year: Never true    Ran Out of Food in the Last Year: Never true  Transportation Needs: No Transportation Needs (11/20/2022)   PRAPARE - Administrator, Civil Service (Medical): No    Lack of Transportation (Non-Medical): No  Physical Activity: Inactive (11/20/2022)   Exercise Vital Sign    Days of Exercise per Week: 0 days    Minutes of Exercise per Session: 0 min  Stress: No Stress Concern Present (11/20/2022)   [employer] of Occupational Health - Occupational Stress Questionnaire    Feeling of Stress : Only a little  Social Connections: Unknown (11/20/2022)   Social Connection and Isolation Panel [NHANES]    Frequency of Communication with Friends and Family: More than three times a week    Frequency of Social Gatherings with Friends and Family: Twice a week    Attends Religious Services: Not on Marketing executive or Organizations: Yes    Attends Banker Meetings: Never    Marital Status: Divorced  Catering manager Violence: Unknown (04/02/2022)   Received from [facility]   HITS    Physically Hurt: Not on file    Insult or Talk Down To: Not on file    Threaten Physical Harm: Not on file    Scream or Curse: Not on file    Review of Systems:    Constitutional: No weight loss, fever or chills Skin: No rash Cardiovascular: No chest pain Respiratory: No SOB  Gastrointestinal: See HPI and otherwise negative Genitourinary: No dysuria  Neurological: No headache,  dizziness or syncope Musculoskeletal: No new muscle or joint pain Hematologic: No bleeding Psychiatric: No history of depression or anxiety   Physical Exam:  Vital signs: BP 120/70   Pulse 76   Ht 6' (1.829 m)   Wt 271 lb 9.6 oz (123.2 kg)   SpO2 90%   BMI 36.84 kg/m    Constitutional:   Pleasant Caucasian male appears to be in NAD, Well developed, Well nourished, alert and cooperative Head:  Normocephalic and atraumatic. Eyes:   PEERL, EOMI. No icterus. Conjunctiva pink. Ears:  Normal auditory acuity. Neck:  Supple Throat: Oral cavity and pharynx without inflammation, swelling or lesion.  Respiratory: Respirations even and unlabored. Lungs clear to auscultation bilaterally.   No wheezes, crackles, or rhonchi.  Cardiovascular: Normal S1, S2. No MRG. Regular rate and rhythm. No peripheral edema, cyanosis or pallor.  Gastrointestinal:  Soft, nondistended, nontender. No rebound or guarding. Normal bowel sounds. No appreciable masses or hepatomegaly. Rectal:  Not performed.  Msk:  Symmetrical without gross deformities. Without edema, no deformity or joint abnormality.  Neurologic:  Alert and  oriented x4;  grossly normal neurologically.  Skin:   Dry and intact without significant lesions or rashes. Psychiatric: Demonstrates good judgement and reason without abnormal affect or behaviors.  RELEVANT LABS AND IMAGING: CBC    Component Value Date/Time   WBC 9.1 10/26/2022 1433   WBC 8.6 05/13/2018 1130   RBC 5.07 10/26/2022 1433   RBC 5.03 05/13/2018 1130   HGB 16.3 10/26/2022 1433   HCT 48.0 10/26/2022 1433   PLT 181 10/26/2022 1433   MCV 95 10/26/2022 1433   MCH 32.1 10/26/2022 1433   MCH 32.4 05/13/2018 1130  MCHC 34.0 10/26/2022 1433   MCHC 33.1 05/13/2018 1130   RDW 13.6 10/26/2022 1433   LYMPHSABS 2.2 10/26/2022 1433   EOSABS 0.0 10/26/2022 1433   BASOSABS 0.0 10/26/2022 1433    CMP     Component Value Date/Time   NA 136 10/26/2022 1433   K 4.3 10/26/2022 1433    CL 100 10/26/2022 1433   CO2 26 10/26/2022 1433   GLUCOSE 90 10/26/2022 1433   GLUCOSE 99 05/13/2018 1130   BUN 10 10/26/2022 1433   CREATININE 0.99 10/26/2022 1433   CREATININE 1.2 06/15/2015 1232   CALCIUM 9.1 10/26/2022 1433   PROT 6.9 10/26/2022 1433   ALBUMIN 4.2 10/26/2022 1433   AST 21 10/26/2022 1433   ALT 18 10/26/2022 1433   ALKPHOS 75 10/26/2022 1433   BILITOT 0.4 10/26/2022 1433   GFRNONAA 69 04/15/2020 1028   GFRAA 79 04/15/2020 1028    Assessment: 1.  Dysphagia: Worsening over the past 3 years; consider stricture versus dysmotility versus other 2.  A-fib on Eliquis 3.  COPD on O2 in the evening  Plan: 1.  Scheduled patient for barium esophagram with tablet for further evaluation of dysphagia. 2.  Also scheduled patient for a diagnostic EGD with dilation and at the hospital with Dr. Myrtie Neither given his COPD with use of oxygen 3.  Discussed risk, benefits, limitations and alternatives of the procedure and he agrees to proceed. 4.  Patient will need to hold his Eliquis for 2 days prior to time of procedure.  We will communicate this with his physician to ensure this is acceptable for him. 5.  Started the patient on Omeprazole 40 mg every morning, 30-60 minutes before breakfast.  #30 with 11 refills 6.  Reviewed antidysphagia measures 7.  Patient to follow in clinic per recommendations from Dr. Myrtie Neither after time of procedure.  At some point may need to address colorectal cancer screening.  Not sure that he would be able to prep at this point given choking even on his saliva.  Hyacinth Meeker, PA-C [facility] 03/20/2023, 1:51 PM  Cc: [name], NP

## 2023-03-22 ENCOUNTER — Telehealth: Payer: Self-pay

## 2023-03-22 NOTE — Telephone Encounter (Signed)
Received fax form [name], PA-C office in regards to hold Eliquis 3-4 days prior to the procedure.  Dr. Myrtie Neither, this patient is scheduled for a hospital colonoscopy with you on 05/15/23. Please advise how would you like to proceed. Patient takes Eliquis 1 tablet BID. Thank you.

## 2023-03-22 NOTE — Telephone Encounter (Signed)
Informed patient hold his Eliquis on the 9/8 evening. Made him aware holding last Eliquis dose 2 evenings prior to the procedure which is scheduled on 9/10. Patient voiced understanding.

## 2023-03-22 NOTE — Telephone Encounter (Signed)
Last Eliquis dose 2 evenings prior to the procedure (so, about 36 hours before his AM procedure)  - H. Danis

## 2023-03-22 NOTE — Progress Notes (Signed)
____________________________________________________________  Attending physician addendum:  Thank you for sending this case to me. I have reviewed the entire note and relevant clinical information.  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  We will see what the barium esophagram shows and tentatively keep on hospital endoscopy schedule pending that result.  Amada Jupiter, MD  ____________________________________________________________

## 2023-04-06 ENCOUNTER — Other Ambulatory Visit (HOSPITAL_COMMUNITY): Payer: Medicare HMO

## 2023-04-07 ENCOUNTER — Encounter: Payer: Self-pay | Admitting: Nurse Practitioner

## 2023-04-11 ENCOUNTER — Other Ambulatory Visit: Payer: Self-pay

## 2023-04-11 DIAGNOSIS — F458 Other somatoform disorders: Secondary | ICD-10-CM

## 2023-05-08 ENCOUNTER — Encounter (HOSPITAL_COMMUNITY): Payer: Self-pay | Admitting: Gastroenterology

## 2023-05-08 NOTE — Progress Notes (Signed)
Steven Hayden  PCP-Sara Early NP  EKG-04/02/22  Echo-04/03/22 Cath- [name] [name] ICD/PM-n/a Blood thinner-Eliquis 2 doses hold GLP-1-n/a  Hx: Pre DM, COPD, HTN, AAA repaired 2015, Afib, Bipolar, Schizophrenia,Dysphagia. Last visit with vascular surgery for survelliance of AAA was 10/25/22 with a year follow up. Last visit with pulmonolgy was 10/2021 pt states feels his breathing is good, uses 02 at night.  Anesthesia Review: Yes

## 2023-05-09 ENCOUNTER — Telehealth: Payer: Self-pay

## 2023-05-09 NOTE — Telephone Encounter (Signed)
Dr. Myrtie Neither, I rescheduled patient egd on 10/24 at [facility] from 9/10 per [place]. Patient was originally scheduled for Barium swallow on 8/12 at the office visit. He rescheduled his swallow study on 9/11 a day after the procedure. And we were not aware of it until yesterday. Please advise if you like to proceed with otherwise. New Ambulatory referral and instruction need to be done after Dr. Myrtie Neither is ok with the change.

## 2023-05-14 ENCOUNTER — Telehealth: Payer: Self-pay | Admitting: Gastroenterology

## 2023-05-14 ENCOUNTER — Encounter: Payer: Self-pay | Admitting: Nurse Practitioner

## 2023-05-14 ENCOUNTER — Ambulatory Visit (INDEPENDENT_AMBULATORY_CARE_PROVIDER_SITE_OTHER): Payer: Medicare HMO | Admitting: Nurse Practitioner

## 2023-05-14 VITALS — BP 128/80 | HR 72 | Wt 272.6 lb

## 2023-05-14 DIAGNOSIS — J449 Chronic obstructive pulmonary disease, unspecified: Secondary | ICD-10-CM

## 2023-05-14 DIAGNOSIS — E118 Type 2 diabetes mellitus with unspecified complications: Secondary | ICD-10-CM

## 2023-05-14 DIAGNOSIS — R600 Localized edema: Secondary | ICD-10-CM | POA: Diagnosis not present

## 2023-05-14 DIAGNOSIS — L299 Pruritus, unspecified: Secondary | ICD-10-CM | POA: Diagnosis not present

## 2023-05-14 DIAGNOSIS — F209 Schizophrenia, unspecified: Secondary | ICD-10-CM

## 2023-05-14 DIAGNOSIS — E782 Mixed hyperlipidemia: Secondary | ICD-10-CM | POA: Diagnosis not present

## 2023-05-14 DIAGNOSIS — M7989 Other specified soft tissue disorders: Secondary | ICD-10-CM

## 2023-05-14 DIAGNOSIS — R7303 Prediabetes: Secondary | ICD-10-CM | POA: Diagnosis not present

## 2023-05-14 DIAGNOSIS — E038 Other specified hypothyroidism: Secondary | ICD-10-CM

## 2023-05-14 DIAGNOSIS — N189 Chronic kidney disease, unspecified: Secondary | ICD-10-CM

## 2023-05-14 DIAGNOSIS — I10 Essential (primary) hypertension: Secondary | ICD-10-CM | POA: Diagnosis not present

## 2023-05-14 DIAGNOSIS — Z6837 Body mass index (BMI) 37.0-37.9, adult: Secondary | ICD-10-CM | POA: Diagnosis not present

## 2023-05-14 DIAGNOSIS — I480 Paroxysmal atrial fibrillation: Secondary | ICD-10-CM | POA: Diagnosis not present

## 2023-05-14 DIAGNOSIS — E66812 Obesity, class 2: Secondary | ICD-10-CM

## 2023-05-14 DIAGNOSIS — I714 Abdominal aortic aneurysm, without rupture, unspecified: Secondary | ICD-10-CM | POA: Diagnosis not present

## 2023-05-14 MED ORDER — PREDNISONE 20 MG PO TABS
ORAL_TABLET | ORAL | 0 refills | Status: DC
Start: 2023-05-14 — End: 2023-07-17

## 2023-05-14 NOTE — Patient Instructions (Addendum)
I have sent in the steroids for you. We will see if this will help for a period of time.   I want you to start tapering off of the gabapentin. Take 3 tablets 3 times a day for one month, then 2 tablets three times a day for one month, the 1 tablet one time a day for one month. Then stop.   I will check and see if your insurance will cover the mounjaro.

## 2023-05-14 NOTE — Telephone Encounter (Signed)
Thank you for the note.  We will review his barium study with report is available and make a final decision about whether or not to proceed with upper endoscopy.  H Danis

## 2023-05-14 NOTE — Telephone Encounter (Signed)
Received page to on call. Scheduled for esophagram on 9/11, but calls to state mother had a stroke and [place]. Needs to reschedule his esophagram. Is scheduled for EGD wirth Dr. Myrtie Neither on 10/24, so ideally to be done prior to that.

## 2023-05-14 NOTE — Progress Notes (Unsigned)
[name], DNP, AGNP-c [facility]  [address] [place], [place] [address] [phone]  ESTABLISHED PATIENT- Chronic Health and/or Follow-Up Visit  Blood pressure 138/82, pulse 72, weight 272 lb 9.6 oz (123.7 kg), SpO2 (!) 86%.    Steven Hayden is a 72 y.o. year old male presenting today for evaluation and management of chronic conditions: Pruritus.  COPD Upon arrival his O2 saturations were 86%. He was placed on 2L [place] and his saturations came up to 93% while seated. He tells me he does not get much better than this. He tells me has oxygen at home. He does sleep with this. He tells me that his oxygen at home is dropping into the mid 80's "all the time" and he has to work hard to breath while he is active.   Itching He tells me that the gabapentin is no longer working at all. He tells me that previously he would take a z-pack and a steroid and the symptoms would improve. He tells me that no matter what he tries it will work for a while then stops. He reports taking "up to 20 gabapentin a day" and this did not help. He tells me if he "gets a buzz off of alcohol" the itching stops. He reports that the itching is all over his body from his head to his feet. He has still been taking the gabapentin daily. He is asking about Dupixant. He tells me that there a scale that shows up on the skin and he will start to itch controllable. He tells me cold water makes it better.   Right Leg Swelling He tells me the right leg has been swelling for about a month. It does not hurt. He tells me that when he had AAA they placed stents in his leg and he is concerned that the stents have stopped working. He reports that his swelling did go down completely when he took an injection of 5mg  Mounjaro from a friend.     All ROS negative with exception of what is listed above.   PHYSICAL EXAM Physical Exam  PLAN Problem List Items Addressed This Visit     Bilateral leg edema    Relevant Orders   Hemoglobin A1c   CBC with Differential/Platelet   Comprehensive metabolic panel   Iron, TIBC and Ferritin Panel   Hepatitis C antibody   HIV Antibody (routine testing w rflx)   Lipid panel   VITAMIN D 25 Hydroxy (Vit-D Deficiency, Fractures)   Vitamin B12   TSH   T4, free   Ambulatory referral to Vascular Surgery   Primary hypertension   Relevant Orders   Hemoglobin A1c   CBC with Differential/Platelet   Comprehensive metabolic panel   Iron, TIBC and Ferritin Panel   Hepatitis C antibody   HIV Antibody (routine testing w rflx)   Lipid panel   VITAMIN D 25 Hydroxy (Vit-D Deficiency, Fractures)   Vitamin B12   TSH   T4, free   COPD (chronic obstructive pulmonary disease) (HCC)   Relevant Medications   predniSONE (DELTASONE) 20 MG tablet   Other Relevant Orders   Hemoglobin A1c   CBC with Differential/Platelet   Comprehensive metabolic panel   Iron, TIBC and Ferritin Panel   Hepatitis C antibody   HIV Antibody (routine testing w rflx)   Lipid panel   VITAMIN D 25 Hydroxy (Vit-D Deficiency, Fractures)   Vitamin B12   TSH   T4, free   Chronic pruritus - Primary  Relevant Medications   predniSONE (DELTASONE) 20 MG tablet   Other Relevant Orders   Hemoglobin A1c   CBC with Differential/Platelet   Comprehensive metabolic panel   Iron, TIBC and Ferritin Panel   Hepatitis C antibody   HIV Antibody (routine testing w rflx)   Lipid panel   VITAMIN D 25 Hydroxy (Vit-D Deficiency, Fractures)   Vitamin B12   TSH   T4, free   ANA, IFA (with reflex)   Prediabetes   Relevant Orders   Hemoglobin A1c   CBC with Differential/Platelet   Comprehensive metabolic panel   Iron, TIBC and Ferritin Panel   Hepatitis C antibody   HIV Antibody (routine testing w rflx)   Lipid panel   VITAMIN D 25 Hydroxy (Vit-D Deficiency, Fractures)   Vitamin B12   TSH   T4, free   Paroxysmal atrial fibrillation (HCC)   Relevant Orders   Hemoglobin A1c   CBC with  Differential/Platelet   Comprehensive metabolic panel   Iron, TIBC and Ferritin Panel   Hepatitis C antibody   HIV Antibody (routine testing w rflx)   Lipid panel   VITAMIN D 25 Hydroxy (Vit-D Deficiency, Fractures)   Vitamin B12   TSH   T4, free   Mixed hyperlipidemia   Relevant Orders   Hemoglobin A1c   CBC with Differential/Platelet   Comprehensive metabolic panel   Iron, TIBC and Ferritin Panel   Hepatitis C antibody   HIV Antibody (routine testing w rflx)   Lipid panel   VITAMIN D 25 Hydroxy (Vit-D Deficiency, Fractures)   Vitamin B12   TSH   T4, free   Class 2 severe obesity due to excess calories with serious comorbidity and body mass index (BMI) of 37.0 to 37.9 in adult [facility])   Relevant Orders   Hemoglobin A1c   CBC with Differential/Platelet   Comprehensive metabolic panel   Iron, TIBC and Ferritin Panel   Hepatitis C antibody   HIV Antibody (routine testing w rflx)   Lipid panel   VITAMIN D 25 Hydroxy (Vit-D Deficiency, Fractures)   Vitamin B12   TSH   T4, free   Other Visit Diagnoses     Essential hypertension       Relevant Orders   Hemoglobin A1c   CBC with Differential/Platelet   Comprehensive metabolic panel   Iron, TIBC and Ferritin Panel   Hepatitis C antibody   HIV Antibody (routine testing w rflx)   Lipid panel   VITAMIN D 25 Hydroxy (Vit-D Deficiency, Fractures)   Vitamin B12   TSH   T4, free       No follow-ups on file.  Time: *** minutes, >50% spent counseling, care coordination, chart review, and documentation.   [name], DNP, AGNP-c

## 2023-05-15 ENCOUNTER — Encounter: Payer: Self-pay | Admitting: Nurse Practitioner

## 2023-05-15 NOTE — Assessment & Plan Note (Signed)
Chronic. BP stable No alarm symptoms present at this time.  taking as prescribed. Goal blood pressure less than less than 130/80. Currently is followed with cardiology.  Plan: current treatment plan is effective, no change in therapy.  Refills have not been provided today. Labs today to check electrolytes and kidney function.  Will make changes to plan of care as necessary based on lab results.  Plan to follow-up in 4 months.

## 2023-05-15 NOTE — Assessment & Plan Note (Addendum)
Chronic. No alarm symptoms today. He is currently on chronic anticoagulation with no signs of bleeding or excessive bruising. Patient in NSR in the office. There is question in my mind whether a delayed hypersensitivity to apixaban could be triggering the pruritus and rash the patient has been dealing with. I have had a difficult time discerning when the pruritus first began with chart review, but can see Eliquis was started around 02/2021 for new onset of Afib. Considering he is in NSR, we can consider holding the medication for 2-3 days to monitor for symptom improvement. He is not followed with cardiology at this time. It may be beneficial to have their input on the recommendations.

## 2023-05-15 NOTE — Telephone Encounter (Signed)
Noted, and I agree.  Thank you  HD

## 2023-05-15 NOTE — Assessment & Plan Note (Addendum)
Labs pending today. Currently on lisinopril for blood pressure control and rosuvastatin for lipid control. No alarm symptoms present. Historically he has had elevated blood sugar levels, we will recheck this today to ensure he has not moved into the diabetes range. I strongly recommend tight blood sugar, blood pressure, and cholesterol control to prevent further kidney damage.  Plan: - Labs pending today - Continue current medications to control co-morbid conditions.  - Consider mounjaro for blood sugar and weight control based on labs and insurance coverage.

## 2023-05-15 NOTE — Assessment & Plan Note (Signed)
Chronic. O2 in mid 880's on presentation. He did recover to 92% with 2L [place] while in the office and resting. WOB increased with activity, but improves with rest. Tripod seated positioning present throughout the visit. He reports that his baseline at home is around 86% and he uses supplemental oxygen as needed and at bedtime. His maximum O2 is usually 92% per his report. O2 and WOB appear to be patients baseline. He is certainly limited in his activity due to this.  Plan: - Recommend close monitoring of O2 saturations and close follow-up with pulmonology for management.  - If oxygen is consistently falling less than 86% I recommend contacting pulmonology for re-evaluation.  - Recommend avoidance of irritants to the lungs and smoking cessation.

## 2023-05-15 NOTE — Assessment & Plan Note (Signed)
I feel his mental health situation is exacerbated by the chronic itching he is experiencing. This is understandable given the lack of relief he has had in the past weeks. I strongly feel that psychiatry evaluation may be helpful in ongoing management. The patient expresses that the itching pushes him to think about suicide, but he denies a plan or intention. I do not feel he is at risk of self harm at this time. It is not clear if the pruritus is driven by psychiatric history. It would be helpful for psychiatry input.  Plan: - Strongly recommend psychiatric evaluation for long term mental health history and recurrent episodes of pruritus which are clearly mentally distressing.

## 2023-05-15 NOTE — Assessment & Plan Note (Signed)
Chronic. He is up to date on imaging at this time. No alarm symptoms present. Will continue to monitor.

## 2023-05-15 NOTE — Assessment & Plan Note (Signed)
Chronic history of elevated glucose levels in the setting of comorbid conditions of obesity, HLD, HTN, and CKD. He has been using Mounjaro for the past month which he received from a friend who suggested it may help with weight. He feels this has been beneficial and would like to continue on the medication. I do feel that his medication would benefit him, however, we discussed the limited options if he is not diabetic. We can consider Zepbound if insurance covers this.  Plan: - Recheck labs to monitor A1c - Consider start of Mounjaro or Ozempic based on insurance coverage if diabetes is present.

## 2023-05-15 NOTE — Assessment & Plan Note (Addendum)
Chronic pruritic condition of unknown etiology. Initially he had success with trial of gabapentin, however, dosage increases were required every few months and now he is at the point that this is no longer helping. I have sent a referral to dermatology at [facility] and have considered neurological evaluation to help rule out possible neuropsychiatric component or nerve mediated issue. I have also reviewed the record for possible correlation with medications. He has already stopped hydrochlorothiazide several years ago, which was found not to be the cause. Another consideration would be hypersensitivity reaction to Eliquis, which was started 02/2021. It is unclear when the pruritus started.  I discussed with him that I am beyond my expertise and abilities at this time for further medication management. We will retry a steroid taper to see if we can get relief, but I am not sure if there is anything else that can be done from the primary care perspective. I have encouraged him to set up the appointment with dermatology as soon as they contact him.  We can consider holding Eliquis for a couple of days if the onset of the itching correlates with the start of the medication, otherwise I do not think this would be beneficial. My final thought is for psychiatric evaluation given his history and the distress that this is causing him. I am concerned for his mental health, although he denies a plan, he mentions great distress and passive SI due to the chronic itching.

## 2023-05-15 NOTE — Assessment & Plan Note (Signed)
Swelling present in the right lower extremity with no alarm symptoms present. Examination shows non-pitting edema of a 1+ with no warmth, erythema, or pinpoint tenderness noted. It is not clear the cause for the swelling. The patient is on chronic anticoagulation and symptoms are not consistent with thrombus. He does mention previous vascular stenting, which could be a cause if the stent failed. I will obtain labs today but strongly recommend contacting the vascular surgeon for further evaluation.

## 2023-05-15 NOTE — Telephone Encounter (Signed)
Left patient a detailed vm advising him to contact radiology scheduling to reschedule barium esophagram appt at his earliest convenience. Pt has been advised that he will need to have this imaging completed prior to his EGD on 06/28/23. I also sent pt a MyChart message with the same information.

## 2023-05-15 NOTE — Assessment & Plan Note (Signed)
Chronic. Lipids measured today. No alarm symptoms present at this time. LDL Goal < 70. Controlled with rosuvastatin Plan: Continue current lipid-lowering therapy. orders written for new lab studies as appropriate; see orders Recommend Treatment of hypertension , Weight reduction , and Strict diet and exercise Diet and exercise recommendations provided.  Follow-up in 4months or sooner based on lab findings as appropriate.

## 2023-05-15 NOTE — Assessment & Plan Note (Signed)
>>  ASSESSMENT AND PLAN FOR AAA (ABDOMINAL AORTIC ANEURYSM) WITHOUT RUPTURE (HCC) WRITTEN ON 05/15/2023  8:17 AM BY Angelino Rumery E, NP  Chronic. He is up to date on imaging at this time. No alarm symptoms present. Will continue to monitor.

## 2023-05-15 NOTE — Assessment & Plan Note (Signed)
Chronic in the setting of multiple conditions. Recommend diet and exercise management for weight control and reduction of risks.

## 2023-05-16 ENCOUNTER — Ambulatory Visit (HOSPITAL_COMMUNITY): Payer: Medicare HMO

## 2023-05-17 LAB — CBC WITH DIFFERENTIAL/PLATELET
Basophils Absolute: 0 10*3/uL (ref 0.0–0.2)
Basos: 0 %
EOS (ABSOLUTE): 0.1 10*3/uL (ref 0.0–0.4)
Eos: 1 %
Hematocrit: 47.6 % (ref 37.5–51.0)
Hemoglobin: 16.1 g/dL (ref 13.0–17.7)
Immature Grans (Abs): 0 10*3/uL (ref 0.0–0.1)
Immature Granulocytes: 0 %
Lymphocytes Absolute: 4 10*3/uL — ABNORMAL HIGH (ref 0.7–3.1)
Lymphs: 49 %
MCH: 32 pg (ref 26.6–33.0)
MCHC: 33.8 g/dL (ref 31.5–35.7)
MCV: 95 fL (ref 79–97)
Monocytes Absolute: 0.7 10*3/uL (ref 0.1–0.9)
Monocytes: 9 %
Neutrophils Absolute: 3.3 10*3/uL (ref 1.4–7.0)
Neutrophils: 41 %
Platelets: 165 10*3/uL (ref 150–450)
RBC: 5.03 x10E6/uL (ref 4.14–5.80)
RDW: 14.3 % (ref 11.6–15.4)
WBC: 8.1 10*3/uL (ref 3.4–10.8)

## 2023-05-17 LAB — COMPREHENSIVE METABOLIC PANEL
ALT: 18 IU/L (ref 0–44)
AST: 23 IU/L (ref 0–40)
Albumin: 4.1 g/dL (ref 3.8–4.8)
Alkaline Phosphatase: 76 IU/L (ref 44–121)
BUN/Creatinine Ratio: 13 (ref 10–24)
BUN: 15 mg/dL (ref 8–27)
Bilirubin Total: 0.4 mg/dL (ref 0.0–1.2)
CO2: 25 mmol/L (ref 20–29)
Calcium: 9.2 mg/dL (ref 8.6–10.2)
Chloride: 102 mmol/L (ref 96–106)
Creatinine, Ser: 1.12 mg/dL (ref 0.76–1.27)
Globulin, Total: 2.7 g/dL (ref 1.5–4.5)
Glucose: 85 mg/dL (ref 70–99)
Potassium: 4.5 mmol/L (ref 3.5–5.2)
Sodium: 141 mmol/L (ref 134–144)
Total Protein: 6.8 g/dL (ref 6.0–8.5)
eGFR: 70 mL/min/{1.73_m2} (ref >59)

## 2023-05-17 LAB — LIPID PANEL
Chol/HDL Ratio: 5.5 ratio — ABNORMAL HIGH (ref 0.0–5.0)
Cholesterol, Total: 203 mg/dL — ABNORMAL HIGH (ref 100–199)
HDL: 37 mg/dL — ABNORMAL LOW (ref >39)
LDL Chol Calc (NIH): 144 mg/dL — ABNORMAL HIGH (ref 0–99)
Triglycerides: 120 mg/dL (ref 0–149)
VLDL Cholesterol Cal: 22 mg/dL (ref 5–40)

## 2023-05-17 LAB — IRON,TIBC AND FERRITIN PANEL
Ferritin: 186 ng/mL (ref 30–400)
Iron Saturation: 41 % (ref 15–55)
Iron: 98 ug/dL (ref 38–169)
Total Iron Binding Capacity: 238 ug/dL — ABNORMAL LOW (ref 250–450)
UIBC: 140 ug/dL (ref 111–343)

## 2023-05-17 LAB — ANTINUCLEAR ANTIBODIES, IFA: ANA Titer 1: NEGATIVE

## 2023-05-17 LAB — HEPATITIS C ANTIBODY: Hep C Virus Ab: NONREACTIVE

## 2023-05-17 LAB — HEMOGLOBIN A1C
Est. average glucose Bld gHb Est-mCnc: 120 mg/dL
Hgb A1c MFr Bld: 5.8 % — ABNORMAL HIGH (ref 4.8–5.6)

## 2023-05-17 LAB — THYROID PEROXIDASE ANTIBODY: Thyroperoxidase Ab SerPl-aCnc: <9 [IU]/mL (ref 0–34)

## 2023-05-17 LAB — TSH: TSH: 1.54 u[IU]/mL (ref 0.450–4.500)

## 2023-05-17 LAB — SPECIMEN STATUS REPORT

## 2023-05-17 LAB — T4, FREE: Free T4: 0.78 ng/dL — ABNORMAL LOW (ref 0.82–1.77)

## 2023-05-17 LAB — VITAMIN B12: Vitamin B-12: 489 pg/mL (ref 232–1245)

## 2023-05-17 LAB — HIV ANTIBODY (ROUTINE TESTING W REFLEX): HIV Screen 4th Generation wRfx: NONREACTIVE

## 2023-05-17 LAB — VITAMIN D 25 HYDROXY (VIT D DEFICIENCY, FRACTURES): Vit D, 25-Hydroxy: 44.6 ng/mL (ref 30.0–100.0)

## 2023-05-23 DIAGNOSIS — E038 Other specified hypothyroidism: Secondary | ICD-10-CM | POA: Insufficient documentation

## 2023-05-23 MED ORDER — LEVOTHYROXINE SODIUM 25 MCG PO TABS
25.0000 ug | ORAL_TABLET | Freq: Every day | ORAL | 3 refills | Status: DC
Start: 2023-05-23 — End: 2024-02-12

## 2023-05-23 MED ORDER — TIRZEPATIDE 2.5 MG/0.5ML ~~LOC~~ SOAJ: 2.5000 mg | SUBCUTANEOUS | 0 refills | Status: DC

## 2023-05-23 NOTE — Addendum Note (Signed)
Addended by: Eniya Cannady, Huntley Dec E on: 05/23/2023 08:16 AM   Modules accepted: Orders

## 2023-05-27 ENCOUNTER — Telehealth: Payer: Self-pay | Admitting: Nurse Practitioner

## 2023-05-27 NOTE — Telephone Encounter (Signed)
P.A. Greggory Keen

## 2023-06-01 ENCOUNTER — Telehealth: Payer: Self-pay | Admitting: Nurse Practitioner

## 2023-06-01 NOTE — Telephone Encounter (Signed)
Fax refill request   Amitriptyline  50mg 

## 2023-06-04 NOTE — Telephone Encounter (Signed)
P.A. denied, not covered for Prediabetes, Atherosclerotic heart disease, abdominal aortic aneurysm. Weight loss meds are exclusion for Part D coverage. Sending to pharmacist to see if she has any suggestions

## 2023-06-05 ENCOUNTER — Telehealth: Payer: Self-pay

## 2023-06-05 NOTE — Progress Notes (Signed)
06/05/2023  Patient ID: [name], male   DOB: [birth year], 72 y.o.   MRN: [id number]  Attempted to contact patient to discuss cost concerns with [facility]. Left HIPAA compliant message for patient to return my call at their convenience.   Sherrill Raring, PharmD Clinical Pharmacist [phone]

## 2023-06-06 ENCOUNTER — Ambulatory Visit (HOSPITAL_COMMUNITY)
Admission: RE | Admit: 2023-06-06 | Discharge: 2023-06-06 | Disposition: A | Discharge status: Home / Self Care | Payer: Medicare HMO | Source: Ambulatory Visit | Attending: Physician Assistant | Admitting: Physician Assistant

## 2023-06-06 ENCOUNTER — Telehealth: Payer: Self-pay | Admitting: Pulmonary Disease

## 2023-06-06 DIAGNOSIS — R1310 Dysphagia, unspecified: Secondary | ICD-10-CM | POA: Insufficient documentation

## 2023-06-06 DIAGNOSIS — R638 Other symptoms and signs concerning food and fluid intake: Secondary | ICD-10-CM | POA: Diagnosis not present

## 2023-06-06 DIAGNOSIS — K225 Diverticulum of esophagus, acquired: Secondary | ICD-10-CM | POA: Diagnosis not present

## 2023-06-06 NOTE — Telephone Encounter (Signed)
Pt states he missed a from dr. Tonia Brooms

## 2023-06-07 ENCOUNTER — Other Ambulatory Visit: Payer: Self-pay | Admitting: *Deleted

## 2023-06-07 DIAGNOSIS — K225 Diverticulum of esophagus, acquired: Secondary | ICD-10-CM

## 2023-06-07 NOTE — Telephone Encounter (Signed)
Called and left voicemail with patient x 2 (9/30; 10/3)

## 2023-06-08 ENCOUNTER — Encounter (INDEPENDENT_AMBULATORY_CARE_PROVIDER_SITE_OTHER): Payer: Self-pay | Admitting: Otolaryngology

## 2023-06-08 ENCOUNTER — Ambulatory Visit (INDEPENDENT_AMBULATORY_CARE_PROVIDER_SITE_OTHER): Payer: Medicare HMO | Admitting: Otolaryngology

## 2023-06-08 VITALS — BP 150/84 | HR 71 | Ht 72.0 in | Wt 279.0 lb

## 2023-06-08 DIAGNOSIS — K225 Diverticulum of esophagus, acquired: Secondary | ICD-10-CM | POA: Diagnosis not present

## 2023-06-08 DIAGNOSIS — F17210 Nicotine dependence, cigarettes, uncomplicated: Secondary | ICD-10-CM

## 2023-06-08 DIAGNOSIS — I2510 Atherosclerotic heart disease of native coronary artery without angina pectoris: Secondary | ICD-10-CM

## 2023-06-08 DIAGNOSIS — K219 Gastro-esophageal reflux disease without esophagitis: Secondary | ICD-10-CM | POA: Diagnosis not present

## 2023-06-08 DIAGNOSIS — I10 Essential (primary) hypertension: Secondary | ICD-10-CM | POA: Diagnosis not present

## 2023-06-08 DIAGNOSIS — J387 Other diseases of larynx: Secondary | ICD-10-CM

## 2023-06-08 DIAGNOSIS — J449 Chronic obstructive pulmonary disease, unspecified: Secondary | ICD-10-CM

## 2023-06-08 DIAGNOSIS — I4891 Unspecified atrial fibrillation: Secondary | ICD-10-CM | POA: Diagnosis not present

## 2023-06-08 DIAGNOSIS — R1319 Other dysphagia: Secondary | ICD-10-CM | POA: Diagnosis not present

## 2023-06-08 DIAGNOSIS — Z7901 Long term (current) use of anticoagulants: Secondary | ICD-10-CM

## 2023-06-08 MED ORDER — FAMOTIDINE 20 MG PO TABS
20.0000 mg | ORAL_TABLET | Freq: Two times a day (BID) | ORAL | 1 refills | Status: DC
Start: 2023-06-08 — End: 2023-07-17

## 2023-06-08 NOTE — Patient Instructions (Addendum)
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

## 2023-06-08 NOTE — Progress Notes (Signed)
ENT CONSULT:  Reason for Consult: dysphagia and Zenker's diverticulum  HPI: Steven Hayden is an 72 y.o. male with hx of AAA, Afib, on Eliquis, COPD on oxygen, schizophrenia, here for long-standing dysphagia x 2 yrs and a diagnosis of Zenker's diverticulum. He reports frequent choking on solid foods and liquids. He was at a steak joint 2 years ago and had to have a Heimlick maneuver because the steak got stuck in would not go down. He states that bread is hard to swallow. He chokes on liquids frequently. He has been regurgitating foods for a while now, and at times it comes back up undigested day or two after he swallows it. No weight changes. He has COPD, on Trelegy in am, uses rescue inhaler multiple times a day. No hx of aspiration PNA. Hx of GERD previously on Pepcid 20 mg/Omeprazole 40 mg in the past, does not take it any more per report. He is on Mounjaro for weight loss.  Records indicate he is followed by pulmonary and was seen by GI with the planned EGD pending barium swallow results.  Barium swallow study revealed large Zenker's diverticulum.  There is no record of EGD report unclear if it is still planned.   Has multiple co-morbidities including:  Hx of AAA being observed with serial imaging  Afib on Eliquis CAD/HTN BMI > 37.84 COPD with O2 requirement  Records Reviewed:  GI note 03/20/23   Mr. Steven Hayden is a 72 year old male with a past medical history as listed below including A-fib on Eliquis (05/22/2019 echo with LVEF 50-55%), bipolar disorder, COPD, schizophrenia and multiple others, previously known to Dr. Ewing Schlein at [facility] gastroenterology, who was referred to me by Early, Sung Amabile, NP for a complaint of dysphagia.      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    10/26/2022 CBC, CMP, iron studies, TSH, CRP, RPR and Lyme disease all normal/negative.    12/05/2022 patient seen in clinic by PCP for globus sensation.  Discussed this  is a sensation of throat constriction that have been worsening over the past 6 months with difficulty swallowing small items like grapes.    Today, the patient presents to clinic and tells me he has been having trouble swallowing for the past 2 to 3 years, in fact has had 2 distinct episodes of choking in which someone "saved him", the last was about 6 months ago.  Now he tells me it is worsening to the point where he can even choke on spit in his mouth.  Tells me he either coughs up what ever he is choking on or drinks it down.  Denies any heartburn or reflux symptoms.  No abdominal pain.  He has lost 10 pounds intentionally with diet and exercise.    Patient is on oxygen for his COPD in the evenings and does take Eliquis daily.    Denies fever, chills, weight loss or blood in his stool.   Plan: 1.  Scheduled patient for barium esophagram with tablet for further evaluation of dysphagia. 2.  Also scheduled patient for a diagnostic EGD with dilation and at the hospital with Dr. Myrtie Neither given his COPD with use of oxygen 3.  Discussed risk, benefits, limitations and alternatives of the procedure and he agrees to proceed. 4.  Patient will need to hold his Eliquis for 2 days prior to time of procedure.  We will communicate this with his physician to ensure this is acceptable for him.  5.  Started the patient on Omeprazole 40 mg every morning, 30-60 minutes before breakfast.  #30 with 11 refills 6.  Reviewed antidysphagia measures 7.  Patient to follow in clinic per recommendations from Dr. Myrtie Neither after time of procedure.  At some point may need to address colorectal cancer screening.  Not sure that he would be able to prep at this point given choking even on his saliva.  Op note by Vascular surgery 05/19/2014 [facility] - repair of AAA   Past Medical History:  Diagnosis Date   AAA (abdominal aortic aneurysm) (HCC)    ADD (attention deficit disorder)    Atrial fibrillation (HCC)    Bipolar affective disorder  (HCC)    Cigarette smoker motivated to quit 04/15/2020   COPD (chronic obstructive pulmonary disease) (HCC)    Hyperlipidemia    Hypertension    Lichenification 10/27/2022   Obesity (BMI 30-39.9) 04/15/2020   Prediabetes    Hgb A1c 6.4% August 2021   Schizophrenia [facility])    Statin declined 04/16/2020   States he will not take a statin. Has taken them in past.    Statin declined 04/16/2020   States he will not take a statin. Has taken them in past.      Tobacco abuse    TOBACCO USE 07/14/2008   Qualifier: Diagnosis of   By: Cato Mulligan MD, Bruce        Past Surgical History:  Procedure Laterality Date   ABDOMINAL AORTIC ANEURYSM REPAIR     ABDOMINAL AORTIC ENDOVASCULAR STENT GRAFT N/A 05/18/2014   Procedure: ABDOMINAL AORTIC ENDOVASCULAR STENT GRAFT;  Surgeon: Fransisco Hertz, MD;  Location: [facility] OR;  Service: Vascular;  Laterality: N/A;   cataract Bilateral    08/2022 and 09/2022   KNEE SURGERY     bilateral    Family History  Problem Relation Age of Onset   Hypertension Brother    Hyperlipidemia Brother    Hypertension Father    Hyperlipidemia Father    Cancer Father     Social History:  reports that he has been smoking cigarettes. He has a 25 pack-year smoking history. He has been exposed to tobacco smoke. He has never used smokeless tobacco. He reports current alcohol use of about 6.0 standard drinks of alcohol per week. He reports that he does not use drugs.  Allergies:  Allergies  Allergen Reactions   Codeine Phosphate     REACTION: swelling of uvula, hives   Dilaudid [Hydromorphone]    Pravastatin Sodium Other (See Comments)    Medications: I have reviewed the patient's current medications.  The PMH, PSH, Medications, Allergies, and SH were reviewed and updated.  ROS: Constitutional: Negative for fever, weight loss and weight gain. Cardiovascular: Negative for chest pain and dyspnea on exertion. Respiratory: Is not experiencing shortness of breath at  rest. Gastrointestinal: Negative for nausea and vomiting. Neurological: Negative for headaches. Psychiatric: The patient is not nervous/anxious  Blood pressure (!) 150/84, pulse 71, height 6' (1.829 m), weight 279 lb (126.6 kg), SpO2 92%.  PHYSICAL EXAM:  Exam: General: Well-developed, well-nourished Communication and Voice: raspy wet Respiratory Respiratory effort: Equal inspiration and expiration without stridor Cardiovascular Peripheral Vascular: Warm extremities with equal color/perfusion Eyes: No nystagmus with equal extraocular motion bilaterally Neuro/Psych/Balance: Patient oriented to person, place, and time; Appropriate mood and affect; Gait is intact with no imbalance; Cranial nerves I-XII are intact Head and Face Inspection: Normocephalic and atraumatic without mass or lesion Palpation: Facial skeleton intact without bony stepoffs Salivary Glands: No  mass or tenderness Facial Strength: Facial motility symmetric and full bilaterally ENT Pinna: External ear intact and fully developed External canal: Canal is patent with intact skin Tympanic Membrane: Clear and mobile External Nose: No scar or anatomic deformity Internal Nose: Septum is deviated to the left. No polyp, or purulence. Mucosal edema and erythema present.  Bilateral inferior turbinate hypertrophy.  Lips, Teeth, and gums: Mucosa and teeth intact and viable TMJ: No pain to palpation with full mobility Oral cavity/oropharynx: No erythema or exudate, no lesions present Nasopharynx: No mass or lesion with intact mucosa Hypopharynx: Intact mucosa without pooling of secretions Larynx Glottic: Full true vocal cord mobility without lesion or mass, supraglottic compression with phonation Supraglottic: Normal appearing epiglottis and AE folds Interarytenoid Space: severe post-cricoid pachydermia & edema Subglottic Space: not seen well Neck Neck and Trachea: Midline trachea without mass or lesion Thyroid: No mass or  nodularity Lymphatics: No lymphadenopathy  Procedure: Preoperative diagnosis: chronic dysphagia Zenker's diverticulum  Postoperative diagnosis:   Same + GERD/LPR  Procedure: Flexible fiberoptic laryngoscopy  Surgeon: Ashok Croon, MD  Anesthesia: Topical lidocaine and Afrin Complications: None Condition is stable throughout exam  Indications and consent:  The patient presents to the clinic with Indirect laryngoscopy view was incomplete. Thus it was recommended that they undergo a flexible fiberoptic laryngoscopy. All of the risks, benefits, and potential complications were reviewed with the patient preoperatively and verbal informed consent was obtained.  Procedure: The patient was seated upright in the clinic. Topical lidocaine and Afrin were applied to the nasal cavity. After adequate anesthesia had occurred, I then proceeded to pass the flexible telescope into the nasal cavity. The nasal cavity was patent without rhinorrhea or polyp. The nasopharynx was also patent without mass or lesion. The base of tongue was visualized and was normal. There were no signs of pooling of secretions in the piriform sinuses. The true vocal folds were mobile bilaterally. There were no signs of glottic or supraglottic mucosal lesion or mass. There was severe interarytenoid pachydermia and post cricoid edema. The telescope was then slowly withdrawn and the patient tolerated the procedure throughout.  Studies Reviewed: Esophagram 06/06/23 FINDINGS: Swallowing: Vestibular penetration without aspiration.   Pharynx: Unremarkable.   Esophagus: Large posterior Zenker's diverticulum.   Esophageal motility: Within normal limits.   Hiatal Hernia: None.   Gastroesophageal reflux: None visualized.   Ingested 13mm barium tablet: Passed normally.   Other: None.   IMPRESSION: Limited exam due to patient's inability to tolerate the study. Vestibular penetration without aspiration. Large posterior  Zenker's diverticulum.  12/02/22 CT Angio MPRESSION: 1. Interval resolution of previously seen right upper lobe ground-glass nodule. Persistent bronchial wall thickening in the right lower lobe consistent with chronic bronchitis. Progressive increase of atelectasis in the right lower lobe. Unchanged atelectasis of the right upper lobe. Findings may be related to chronic aspiration. Please correlate with patient's symptoms. 2. Aneurysmal dilatation of the ascending thoracic aorta measuring up to 4.5 x 4.3 cm. Ascending thoracic aortic aneurysm.   Assessment/Plan: Encounter Diagnoses  Name Primary?   Zenker's diverticulum Yes   Esophageal dysphagia    Gastroesophageal reflux disease without esophagitis    Chronic obstructive pulmonary disease, unspecified COPD type (HCC)     Gradually worsening longstanding chronic dysphagia with choking episodes on solid foods, requiring Heimlich maneuver, as well as choking on liquids on a regular basis.   2.  Large Zenker's diverticulum on recent esophagram. -I reviewed imaging results with the patient and we discussed management options for Zenker's diverticulum  which unfortunately due to large size of his esophageal pouch are only limited to open approach.  -Patient is somewhat poor historian and prefers to have a discussion about definitive surgery with his sister who is a Engineer, civil (consulting) -We discussed follow-up appointment after repeat esophagram to determine the exact size of the pouch and CT neck ordered for better surgical planning -he will schedule the appointment when his sister is available to come with him -I discussed that his surgery will be higher risk than average due to multiple comorbidities which include:  Hx of AAA being observed with serial imaging  Afib on Eliquis CAD/HTN BMI > 37.84 COPD with O2 requirement  -He is established with pulmonary and was referred to see cardiology -we discussed that he will require preoperative clearance  from both cardiology and pulmonary - repeat esophagram and CT neck with contrast  - RTC after imaging and preop clearance  2. GERD LPR  - continue Prilosec 40 mg daily  Thank you for allowing me to participate in the care of this patient. Please do not hesitate to contact me with any questions or concerns.   Ashok Croon, MD Otolaryngology [facility] Phone: [phone] Fax: [phone]    06/08/2023, 7:37 PM

## 2023-06-11 NOTE — Telephone Encounter (Signed)
Dr Tonia Brooms patient states he is returning call? LVM no documentation in chart that BI called patient. There are no labs, imaging or appts.

## 2023-06-20 ENCOUNTER — Ambulatory Visit: Payer: Medicare HMO | Admitting: Family Medicine

## 2023-06-20 ENCOUNTER — Encounter: Payer: Self-pay | Admitting: Family Medicine

## 2023-06-20 VITALS — BP 130/88 | HR 73 | Temp 97.9°F | Wt 279.2 lb

## 2023-06-20 DIAGNOSIS — L03115 Cellulitis of right lower limb: Secondary | ICD-10-CM | POA: Diagnosis not present

## 2023-06-20 MED ORDER — DOXYCYCLINE HYCLATE 100 MG PO TABS
100.0000 mg | ORAL_TABLET | Freq: Two times a day (BID) | ORAL | 0 refills | Status: DC
Start: 2023-06-20 — End: 2023-08-23

## 2023-06-20 NOTE — Progress Notes (Signed)
Subjective:    Patient ID: Steven Hayden, male    DOB: 1951-03-16, 72 y.o.   MRN: [id number]  HPI He is here for evaluation of his right lower extremity.  He states that he has had swelling and redness to the right lower extremity for several months and only within the last several days has become slightly painful.  No fever, chills, shortness of breath or chest pain.   Review of Systems     Objective:    Physical Exam Right lower extremity is quite erythematous and edematous Denna Haggard' sign difficult to do due to the discomfort.       Assessment & Plan:   Problem List Items Addressed This Visit   None Visit Diagnoses     Cellulitis of right lower extremity    -  Primary   Relevant Medications   doxycycline (VIBRA-TABS) 100 MG tablet     I explained that I thought this was a cellulitis and will place him on doxycycline.  We did take a photo of this to compare in 3 weeks with how he is doing.  He will call if he has further difficulty.  He probably does have some venous insufficiency but putting a stocking on uncomfortable painful leg might be difficult.

## 2023-06-21 ENCOUNTER — Telehealth: Payer: Self-pay | Admitting: Nurse Practitioner

## 2023-06-21 ENCOUNTER — Telehealth: Payer: Self-pay

## 2023-06-21 NOTE — Telephone Encounter (Signed)
Pt came to see you yesterday and he wanted to tell you that Buergers disease runs in his family and may be the cause of his swollen legs.

## 2023-06-21 NOTE — Progress Notes (Addendum)
   06/21/2023  Patient ID: Steven Hayden, male   DOB: 1951-01-22, 72 y.o.   MRN: [id number]  UPDATE: Called patient to remind him that paperwork is still needing his signature. Had to leave a vm.  Spoke with patient regarding cost concerns with Mounjaro. PA denied. Reviewed income information and he does qualify for [employer] assistance through [employer]. Printed application, patient reports he will come by to sign sometime within the next week.  Sherrill Raring, PharmD Clinical Pharmacist [phone]

## 2023-06-25 NOTE — Progress Notes (Deleted)
[facility] [place]  ASSESSMENT / PLAN: Steven Hayden is a 72 y.o. male with chronic venous insufficiency of *** lower extremity causing *** (C*** disease).  Venous duplex is significant for *** Recommend compression and elevation for symptomatic relief. ***Follow up with me in three months to discuss saphenous vein ablation. ***Follow up with me as needed.   CHIEF COMPLAINT: ***  HISTORY OF PRESENT ILLNESS: Steven Hayden is a 72 y.o. male ***  VASCULAR SURGICAL HISTORY: ***  VASCULAR RISK FACTORS: {FINDINGS; POSITIVE [phone]} history of stroke / transient ischemic attack. {FINDINGS; POSITIVE [phone]} history of coronary artery disease. *** history of PCI. *** history of CABG.  {FINDINGS; POSITIVE [phone]} history of diabetes mellitus. Last A1c ***. {FINDINGS; POSITIVE [phone]} history of smoking. *** actively smoking. {FINDINGS; POSITIVE [phone]} history of hypertension. *** drug regimen with *** control. {FINDINGS; POSITIVE [phone]} history of chronic kidney disease.  Last GFR ***. CKD {stage:30421363}. {FINDINGS; POSITIVE [phone]} history of chronic obstructive pulmonary disease, treated with ***.  FUNCTIONAL STATUS: ECOG performance status: {findings; ecog performance status:31780} Ambulatory status: {TNHAmbulation:25868}  CAREY 1 AND 3 YEAR INDEX Male (2pts) 75-79 or 80-84 (2pts) >84 (3pts) Dependence in toileting (1pt) Partial or full dependence in dressing (1pt) History of malignant neoplasm (2pts) CHF (3pts) COPD (1pts) CKD (3pts)  0-3 pts 6% 1 year mortality ; 21% 3 year mortality 4-5 pts 12% 1 year mortality ; 36% 3 year mortality >5 pts 21% 1 year mortality; 54% 3 year mortality   Past Medical History:  Diagnosis Date   AAA (abdominal aortic aneurysm) (HCC)    ADD (attention deficit disorder)    Atrial fibrillation (HCC)    Bipolar affective  disorder (HCC)    Cigarette smoker motivated to quit 04/15/2020   COPD (chronic obstructive pulmonary disease) (HCC)    Hyperlipidemia    Hypertension    Lichenification 10/27/2022   Obesity (BMI 30-39.9) 04/15/2020   Prediabetes    Hgb A1c 6.4% August 2021   Schizophrenia (HCC)    Statin declined 04/16/2020   States he will not take a statin. Has taken them in past.    Statin declined 04/16/2020   States he will not take a statin. Has taken them in past.      Tobacco abuse    TOBACCO USE 07/14/2008   Qualifier: Diagnosis of   By: Cato Mulligan MD, Bruce        Past Surgical History:  Procedure Laterality Date   ABDOMINAL AORTIC ANEURYSM REPAIR     ABDOMINAL AORTIC ENDOVASCULAR STENT GRAFT N/A 05/18/2014   Procedure: ABDOMINAL AORTIC ENDOVASCULAR STENT GRAFT;  Surgeon: Fransisco Hertz, MD;  Location: [facility] OR;  Service: Vascular;  Laterality: N/A;   cataract Bilateral    08/2022 and 09/2022   KNEE SURGERY     bilateral    Family History  Problem Relation Age of Onset   Hypertension Brother    Hyperlipidemia Brother    Hypertension Father    Hyperlipidemia Father    Cancer Father     Social History   Socioeconomic History   Marital status: Divorced    Spouse name: Not on file   Number of children: 1   Years of education: Not on file   Highest education level: Not on file  Occupational History   Not on file  Tobacco Use   Smoking status: Every Day    Current packs/day: 0.50    Average packs/day: 0.5 packs/day for 50.0 years (25.0 ttl pk-yrs)  Types: Cigarettes    Passive exposure: Current   Smokeless tobacco: Never   Tobacco comments:    currently smoking 1ppd as of 10/07/20  Vaping Use   Vaping status: Never Used  Substance and Sexual Activity   Alcohol use: Yes    Alcohol/week: 6.0 standard drinks of alcohol    Types: 6 Shots of liquor per week    Comment: 6 shots of liquor a week   Drug use: No   Sexual activity: Not Currently  Other Topics Concern   Not on file   Social History Narrative   Not on file   Social Determinants of Health   Financial Resource Strain: Low Risk  (11/20/2022)   Overall Financial Resource Strain (CARDIA)    Difficulty of Paying Living Expenses: Not hard at all  Food Insecurity: No Food Insecurity (11/21/2022)   Hunger Vital Sign    Worried About Running Out of Food in the Last Year: Never true    Ran Out of Food in the Last Year: Never true  Transportation Needs: No Transportation Needs (11/20/2022)   PRAPARE - Administrator, Civil Service (Medical): No    Lack of Transportation (Non-Medical): No  Physical Activity: Inactive (11/20/2022)   Exercise Vital Sign    Days of Exercise per Week: 0 days    Minutes of Exercise per Session: 0 min  Stress: No Stress Concern Present (11/20/2022)   [employer] of Occupational Health - Occupational Stress Questionnaire    Feeling of Stress : Only a little  Social Connections: Unknown (11/20/2022)   Social Connection and Isolation Panel [NHANES]    Frequency of Communication with Friends and Family: More than three times a week    Frequency of Social Gatherings with Friends and Family: Twice a week    Attends Religious Services: Not on Marketing executive or Organizations: Yes    Attends Banker Meetings: Never    Marital Status: Divorced  Catering manager Violence: Unknown (04/02/2022)   Received from [employer], [facility]   HITS    Physically Hurt: Not on file    Insult or Talk Down To: Not on file    Threaten Physical Harm: Not on file    Scream or Curse: Not on file    Allergies  Allergen Reactions   Codeine Phosphate     REACTION: swelling of uvula, hives   Dilaudid [Hydromorphone]    Pravastatin Sodium Other (See Comments)    Current Outpatient Medications  Medication Sig Dispense Refill   albuterol (VENTOLIN HFA) 108 (90 Base) MCG/ACT inhaler Inhale 2 puffs into the lungs every 6 (six) hours as needed for wheezing or  shortness of breath. 18 g 2   apixaban (ELIQUIS) 5 MG TABS tablet Take 1 tablet (5 mg total) by mouth 2 (two) times daily. 180 tablet 3   busPIRone (BUSPAR) 5 MG tablet Take 1 tablet (5 mg total) by mouth 2 (two) times daily. 180 tablet 3   cholecalciferol (VITAMIN D3) 25 MCG (1000 UNIT) tablet Take 1 tablet (1,000 Units total) by mouth daily. 1 tablet 90 tablet 3   Dermatological Products, Misc. [facility]) lotion apply to body daily for maintenance     doxycycline (VIBRA-TABS) 100 MG tablet Take 1 tablet (100 mg total) by mouth 2 (two) times daily. 42 tablet 0   famotidine (PEPCID) 20 MG tablet Take 1 tablet (20 mg total) by mouth 2 (two) times daily. (Patient not taking: Reported on  06/20/2023) 30 tablet 1   FLUoxetine (PROZAC) 20 MG tablet Take 1 tablet (20 mg total) by mouth daily. For itching, mood, and energy. 90 tablet 3   Fluticasone-Umeclidin-Vilant (TRELEGY ELLIPTA) 200-62.5-25 MCG/ACT AEPB INHALE ONE PUFF BY MOUTH DAILY 60 each 11   gabapentin (NEURONTIN) 300 MG capsule Take 2-3 capsules twice a day. Take 4 capsules at bedtime. 900 capsule 3   hydrOXYzine (ATARAX) 10 MG tablet take 1-2 pills po nightly for itching 90 tablet 5   isoniazid (NYDRAZID) 300 MG tablet Take 300 mg by mouth daily.     ketoconazole (NIZORAL) 2 % cream Apply 1 Application topically 2 (two) times daily. Mix 1" of cream with 1" of triamcinolone and apply to rash on back twice a day for two weeks then break for 3 days then repeat until rash improves. 120 g 3   ketoconazole (NIZORAL) 2 % shampoo Use as body wash over entire body twice a week I the shower. Try to let it stay on the skin for a few minutes before rinsing. 120 mL 6   levothyroxine (SYNTHROID) 25 MCG tablet Take 1 tablet (25 mcg total) by mouth daily. Take 30 minutes before any food, without any other medication. 90 tablet 3   lisinopril (ZESTRIL) 20 MG tablet Take 1 tablet (20 mg total) by mouth daily. 90 tablet 3   MAGNESIUM MALATE PO 4050 mg      omeprazole (PRILOSEC) 40 MG capsule Take 1 capsule (40 mg total) by mouth daily before breakfast. 90 capsule 0   predniSONE (DELTASONE) 20 MG tablet Take 60mg  PO daily x 2 days, then40mg  PO daily x 2 days, then 20mg  PO daily x 3 days 13 tablet 0   rosuvastatin (CRESTOR) 5 MG tablet TAKE ONE TABLET BY MOUTH EVERY OTHER DAY 45 tablet 1   tacrolimus (PROTOPIC) 0.1 % ointment Apply topically 2 (two) times daily. 100 g 0   tirzepatide (MOUNJARO) 2.5 MG/0.5ML Pen Inject 2.5 mg into the skin once a week. Increase to 5mg  after 4 weeks. 2 mL 0   triamcinolone cream (KENALOG) 0.5 % Apply 1 Application topically 2 (two) times daily. Mix 1" of cream with 1" of ketoconazole and apply to rash on back twice a day for two weeks then break for 3 days then repeat until rash improves. 120 g 3   No current facility-administered medications for this visit.    PHYSICAL EXAM There were no vitals filed for this visit.  Constitutional: *** appearing. *** distress. Appears *** nourished.  Neurologic: CN ***. *** focal findings. *** sensory loss. Psychiatric: *** Mood and affect symmetric and appropriate. Eyes: *** No icterus. No conjunctival pallor. Ears, nose, throat: *** mucous membranes moist. Midline trachea.  Cardiac: *** rate and rhythm.  Respiratory: *** unlabored. Abdominal: *** soft, non-tender, non-distended.  Peripheral vascular: *** Extremity: *** edema. *** cyanosis. *** pallor.  Skin: *** gangrene. *** ulceration.  Lymphatic: *** Stemmer's sign. *** palpable lymphadenopathy.    PERTINENT LABORATORY AND RADIOLOGIC DATA  Most recent CBC    Latest Ref Rng & Units 05/14/2023   10:11 AM 10/26/2022    2:33 PM 07/25/2022    9:30 AM  CBC  WBC 3.4 - 10.8 x10E3/uL 8.1  9.1  7.8   Hemoglobin 13.0 - 17.7 g/dL 60.4  54.0  98.1   Hematocrit 37.5 - 51.0 % 47.6  48.0  49.2   Platelets 150 - 450 x10E3/uL 165  181  151      Most recent CMP  Latest Ref Rng & Units 05/14/2023   10:11 AM 10/26/2022    2:33  PM 07/25/2022    9:30 AM  CMP  Glucose 70 - 99 mg/dL 85  90  89   BUN 8 - 27 mg/dL 15  10  11    Creatinine 0.76 - 1.27 mg/dL 3.66  4.40  3.47   Sodium 134 - 144 mmol/L 141  136  143   Potassium 3.5 - 5.2 mmol/L 4.5  4.3  4.2   Chloride 96 - 106 mmol/L 102  100  102   CO2 20 - 29 mmol/L 25  26  27    Calcium 8.6 - 10.2 mg/dL 9.2  9.1  9.5   Total Protein 6.0 - 8.5 g/dL 6.8  6.9  6.7   Total Bilirubin 0.0 - 1.2 mg/dL 0.4  0.4  0.3   Alkaline Phos 44 - 121 IU/L 76  75  68   AST 0 - 40 IU/L 23  21  20    ALT 0 - 44 IU/L 18  18  20      Renal function CrCl cannot be calculated (Patient's most recent lab result is older than the maximum 21 days allowed.).  Hgb A1c MFr Bld (%)  Date Value  05/14/2023 5.8 (H)    LDL Chol Calc (NIH)  Date Value Ref Range Status  05/14/2023 144 (H) 0 - 99 mg/dL Final   Direct LDL  Date Value Ref Range Status  12/12/2011 191.5 mg/dL Final    Comment:    Optimal:  <100 mg/dLNear or Above Optimal:  100-129 mg/dLBorderline High:  130-159 mg/dLHigh:  160-189 mg/dLVery High:  >190 mg/dL     Vascular Imaging: ***  Shambria Camerer N. Lenell Antu, MD FACS [facility] Phone Number: [phone] 06/25/2023 8:43 AM   Total time spent on preparing this encounter including chart review, data review, collecting history, examining the patient, coordinating care for this {tnhtimebilling:26202}  Portions of this report may have been transcribed using voice recognition software.  Every effort has been made to ensure accuracy; however, inadvertent computerized transcription errors may still be present.

## 2023-06-26 ENCOUNTER — Encounter: Payer: Self-pay | Admitting: Cardiovascular Disease

## 2023-06-26 ENCOUNTER — Ambulatory Visit (HOSPITAL_COMMUNITY): Payer: Medicare HMO | Attending: Cardiovascular Disease | Admitting: Cardiovascular Disease

## 2023-06-26 ENCOUNTER — Other Ambulatory Visit: Payer: Self-pay

## 2023-06-26 ENCOUNTER — Ambulatory Visit: Payer: Medicare HMO | Admitting: Vascular Surgery

## 2023-06-26 ENCOUNTER — Ambulatory Visit (HOSPITAL_COMMUNITY): Payer: Medicare HMO

## 2023-06-26 VITALS — BP 120/86 | HR 78 | Ht 72.0 in | Wt 279.0 lb

## 2023-06-26 DIAGNOSIS — Z9889 Other specified postprocedural states: Secondary | ICD-10-CM

## 2023-06-26 DIAGNOSIS — L299 Pruritus, unspecified: Secondary | ICD-10-CM

## 2023-06-26 DIAGNOSIS — I480 Paroxysmal atrial fibrillation: Secondary | ICD-10-CM

## 2023-06-26 DIAGNOSIS — R0609 Other forms of dyspnea: Secondary | ICD-10-CM | POA: Diagnosis not present

## 2023-06-26 DIAGNOSIS — J432 Centrilobular emphysema: Secondary | ICD-10-CM

## 2023-06-26 DIAGNOSIS — I2510 Atherosclerotic heart disease of native coronary artery without angina pectoris: Secondary | ICD-10-CM | POA: Diagnosis not present

## 2023-06-26 DIAGNOSIS — R600 Localized edema: Secondary | ICD-10-CM | POA: Diagnosis not present

## 2023-06-26 DIAGNOSIS — F419 Anxiety disorder, unspecified: Secondary | ICD-10-CM

## 2023-06-26 DIAGNOSIS — Z7901 Long term (current) use of anticoagulants: Secondary | ICD-10-CM

## 2023-06-26 DIAGNOSIS — E66812 Obesity, class 2: Secondary | ICD-10-CM

## 2023-06-26 DIAGNOSIS — M7989 Other specified soft tissue disorders: Secondary | ICD-10-CM

## 2023-06-26 DIAGNOSIS — Z6837 Body mass index (BMI) 37.0-37.9, adult: Secondary | ICD-10-CM

## 2023-06-26 MED ORDER — FUROSEMIDE 20 MG PO TABS: 20.0000 mg | ORAL_TABLET | Freq: Every day | ORAL | 3 refills | Status: DC

## 2023-06-26 MED ORDER — EZETIMIBE 10 MG PO TABS: 10.0000 mg | ORAL_TABLET | Freq: Every day | ORAL | 3 refills | Status: DC

## 2023-06-26 MED ORDER — FUROSEMIDE 20 MG PO TABS: 20.0000 mg | ORAL_TABLET | Freq: Every day | ORAL | 0 refills | Status: DC

## 2023-06-26 NOTE — Patient Instructions (Signed)
Medication Instructions:  Your physician has recommended you make the following change in your medication:   -Start furosemide (lasix) 40mg  for 3-5 days, then 20mg  after that.  -Start ezetimibe (zetia) 10mg  once daily.  *If you need a refill on your cardiac medications before your next appointment, please call your pharmacy*   Testing/Procedures: Your physician has requested that you have an echocardiogram. Echocardiography is a painless test that uses sound waves to create images of your heart. It provides your doctor with information about the size and shape of your heart and how well your heart's chambers and valves are working. This procedure takes approximately one hour. There are no restrictions for this procedure. Please do NOT wear cologne, perfume, aftershave, or lotions (deodorant is allowed). Please arrive 15 minutes prior to your appointment time. This will take place at [address] [place]    Follow-Up: At [facility], you and your health needs are our priority.  As part of our continuing mission to provide you with exceptional heart care, we have created designated Provider Care Teams.  These Care Teams include your primary Cardiologist (physician) and Advanced Practice Providers (APPs -  Physician Assistants and Nurse Practitioners) who all work together to provide you with the care you need, when you need it.  We recommend signing up for the patient portal called "MyChart".  Sign up information is provided on this After Visit Summary.  MyChart is used to connect with patients for Virtual Visits (Telemedicine).  Patients are able to view lab/test results, encounter notes, upcoming appointments, etc.  Non-urgent messages can be sent to your provider as well.   To learn more about what you can do with MyChart, go to [email or web address].    Your next appointment:   We will see you on an as needed basis.  Provider:   Dr. Nicki Guadalajara

## 2023-06-26 NOTE — Progress Notes (Signed)
Cardiology Office Note    Date:  07/02/2023   ID:  Hayden Steven, DOB 05-15-1951, MRN [id number]  PCP:  [name], NP  Cardiologist:  Nicki Guadalajara, MD   New cardiology consultation referred by Steven Mechanic, PA and Dr. Ashok Croon of ENT for pre-operative clearance.   History of Present Illness:  Steven Hayden is a 72 y.o. male who has a history of hypertension, diabetes mellitus, COPD, as well as tree of abdominal aortic aneurysm, status post note EVAR on May 18, 2014 by Dr. Imogene Burn.  He has been followed by vascular surgery with yearly vascular ultrasound which has demonstrated patent endovascular aneurysm repair without evidence for endoleak.  He apparently has been diagnosed with his Zenker's diverticulum tic alone and is in need for GI surgery.  The patient has significant issues with chronic pruritus with significant itching erythema being his major complaint.  He states his hands have been peeling for over a month. He has a remote history of atrial fibrillation 2 years ago.  Early he underwent evaluation at [facility] care in [place].  Echo Doppler study showed an EF at 55 to 60% on Jan 20, 2021.  Atrial was normal dimension.  He had mild aortic valve calcification without stenosis.  There was trace MR and trace TR.  He experiences bilateral lower extremity edema.  He is unaware of recent chest pain.  He was scheduled to undergo follow-up lower extremity reflux evaluation with Dr. Lenell Antu following today's office visit.  He is on BuSpar and Prozac for anxiety and depression.  He takes gabapentin for neuropathy.  He has been on lisinopril 20 mg for hypertension.  He has been taking rosuvastatin 5 mg every other day but apparently states he stopped taking this sometime ago.  He is on keto, Lozol and cream.  He is diabetic on Trelegy Ellipta.  He takes prednisone intermittently.  He presents for evaluation.   Past Medical History:  Diagnosis Date    AAA (abdominal aortic aneurysm) (HCC)    ADD (attention deficit disorder)    Atrial fibrillation (HCC)    Bipolar affective disorder (HCC)    Cigarette smoker motivated to quit 04/15/2020   COPD (chronic obstructive pulmonary disease) (HCC)    Hyperlipidemia    Hypertension    Lichenification 10/27/2022   Obesity (BMI 30-39.9) 04/15/2020   Prediabetes    Hgb A1c 6.4% August 2021   Schizophrenia [facility])    Statin declined 04/16/2020   States he will not take a statin. Has taken them in past.    Statin declined 04/16/2020   States he will not take a statin. Has taken them in past.      Tobacco abuse    TOBACCO USE 07/14/2008   Qualifier: Diagnosis of   By: Cato Mulligan MD, Bruce        Past Surgical History:  Procedure Laterality Date   ABDOMINAL AORTIC ANEURYSM REPAIR     ABDOMINAL AORTIC ENDOVASCULAR STENT GRAFT N/A 05/18/2014   Procedure: ABDOMINAL AORTIC ENDOVASCULAR STENT GRAFT;  Surgeon: Fransisco Hertz, MD;  Location: [facility] OR;  Service: Vascular;  Laterality: N/A;   cataract Bilateral    08/2022 and 09/2022   KNEE SURGERY     bilateral    Current Medications: Outpatient Medications Prior to Visit  Medication Sig Dispense Refill   albuterol (VENTOLIN HFA) 108 (90 Base) MCG/ACT inhaler Inhale 2 puffs into the lungs every 6 (six) hours as needed for wheezing or shortness  of breath. 18 g 2   apixaban (ELIQUIS) 5 MG TABS tablet Take 1 tablet (5 mg total) by mouth 2 (two) times daily. 180 tablet 3   busPIRone (BUSPAR) 5 MG tablet Take 1 tablet (5 mg total) by mouth 2 (two) times daily. 180 tablet 3   cholecalciferol (VITAMIN D3) 25 MCG (1000 UNIT) tablet Take 1 tablet (1,000 Units total) by mouth daily. 1 tablet 90 tablet 3   Dermatological Products, Misc. [facility]) lotion apply to body daily for maintenance     doxycycline (VIBRA-TABS) 100 MG tablet Take 1 tablet (100 mg total) by mouth 2 (two) times daily. 42 tablet 0   famotidine (PEPCID) 20 MG tablet Take 1 tablet (20 mg total) by  mouth 2 (two) times daily. 30 tablet 1   FLUoxetine (PROZAC) 20 MG tablet Take 1 tablet (20 mg total) by mouth daily. For itching, mood, and energy. 90 tablet 3   Fluticasone-Umeclidin-Vilant (TRELEGY ELLIPTA) 200-62.5-25 MCG/ACT AEPB INHALE ONE PUFF BY MOUTH DAILY 60 each 11   gabapentin (NEURONTIN) 300 MG capsule Take 2-3 capsules twice a day. Take 4 capsules at bedtime. 900 capsule 3   hydrOXYzine (ATARAX) 10 MG tablet take 1-2 pills po nightly for itching 90 tablet 5   isoniazid (NYDRAZID) 300 MG tablet Take 300 mg by mouth daily.     ketoconazole (NIZORAL) 2 % cream Apply 1 Application topically 2 (two) times daily. Mix 1" of cream with 1" of triamcinolone and apply to rash on back twice a day for two weeks then break for 3 days then repeat until rash improves. 120 g 3   ketoconazole (NIZORAL) 2 % shampoo Use as body wash over entire body twice a week I the shower. Try to let it stay on the skin for a few minutes before rinsing. 120 mL 6   levothyroxine (SYNTHROID) 25 MCG tablet Take 1 tablet (25 mcg total) by mouth daily. Take 30 minutes before any food, without any other medication. 90 tablet 3   lisinopril (ZESTRIL) 20 MG tablet Take 1 tablet (20 mg total) by mouth daily. 90 tablet 3   MAGNESIUM MALATE PO 4050 mg     omeprazole (PRILOSEC) 40 MG capsule Take 1 capsule (40 mg total) by mouth daily before breakfast. 90 capsule 0   predniSONE (DELTASONE) 20 MG tablet Take 60mg  PO daily x 2 days, then40mg  PO daily x 2 days, then 20mg  PO daily x 3 days 13 tablet 0   tacrolimus (PROTOPIC) 0.1 % ointment Apply topically 2 (two) times daily. 100 g 0   triamcinolone cream (KENALOG) 0.5 % Apply 1 Application topically 2 (two) times daily. Mix 1" of cream with 1" of ketoconazole and apply to rash on back twice a day for two weeks then break for 3 days then repeat until rash improves. 120 g 3   rosuvastatin (CRESTOR) 5 MG tablet TAKE ONE TABLET BY MOUTH EVERY OTHER DAY 45 tablet 1   tirzepatide (MOUNJARO)  2.5 MG/0.5ML Pen Inject 2.5 mg into the skin once a week. Increase to 5mg  after 4 weeks. (Patient not taking: Reported on 06/26/2023) 2 mL 0   No facility-administered medications prior to visit.     Allergies:   Codeine phosphate, Dilaudid [hydromorphone], and Pravastatin sodium   Social History   Socioeconomic History   Marital status: Divorced    Spouse name: Not on file   Number of children: 1   Years of education: Not on file   Highest education level: Not on file  Occupational History   Not on file  Tobacco Use   Smoking status: Every Day    Current packs/day: 0.50    Average packs/day: 0.5 packs/day for 50.0 years (25.0 ttl pk-yrs)    Types: Cigarettes    Passive exposure: Current   Smokeless tobacco: Never   Tobacco comments:    currently smoking 1ppd as of 10/07/20  Vaping Use   Vaping status: Never Used  Substance and Sexual Activity   Alcohol use: Yes    Alcohol/week: 6.0 standard drinks of alcohol    Types: 6 Shots of liquor per week    Comment: 6 shots of liquor a week   Drug use: No   Sexual activity: Not Currently  Other Topics Concern   Not on file  Social History Narrative   Not on file   Social Determinants of Health   Financial Resource Strain: Low Risk  (11/20/2022)   Overall Financial Resource Strain (CARDIA)    Difficulty of Paying Living Expenses: Not hard at all  Food Insecurity: No Food Insecurity (11/21/2022)   Hunger Vital Sign    Worried About Running Out of Food in the Last Year: Never true    Ran Out of Food in the Last Year: Never true  Transportation Needs: No Transportation Needs (11/20/2022)   PRAPARE - Administrator, Civil Service (Medical): No    Lack of Transportation (Non-Medical): No  Physical Activity: Inactive (11/20/2022)   Exercise Vital Sign    Days of Exercise per Week: 0 days    Minutes of Exercise per Session: 0 min  Stress: No Stress Concern Present (11/20/2022)   [employer] of Occupational Health -  Occupational Stress Questionnaire    Feeling of Stress : Only a little  Social Connections: Unknown (11/20/2022)   Social Connection and Isolation Panel [NHANES]    Frequency of Communication with Friends and Family: More than three times a week    Frequency of Social Gatherings with Friends and Family: Twice a week    Attends Religious Services: Not on Marketing executive or Organizations: Yes    Attends Banker Meetings: Never    Marital Status: Divorced   Social history is notable he is that he is divorced.  He smokes 1 pack of cigarettes per day.  Makes vodka.  There is no exercise.  Does not exercise.  Family History:  The patient's family history includes Cancer in his father; Hyperlipidemia in his brother and father; Hypertension in his brother and father.  Mother is living at age 33.  Father died at 61 with COPD.  He has 1 sister age 58.  He has 1 child age 40.  ROS General: Negative; No fevers, chills, or night sweats; severe itching over her entire body HEENT: Negative; No changes in vision or hearing, sinus congestion, difficulty swallowing Pulmonary: COPD Cardiovascular: EVAR by Dr. Imogene Burn in 2015; fibrillation in May 2022 GI: Large Zenker's diverticulum GU: Negative; No dysuria, hematuria, or difficulty voiding Musculoskeletal: Negative; no myalgias, joint pain, or weakness Hematologic/Oncology: Negative; no easy bruising, bleeding Endocrine: Positive for diabetes Neuro: Neuropathy Skin: Erythema  Psychiatric: Depression, extremely concerned about his constant itching Sleep: snoring, daytime sleepiness, sleep is poor Other comprehensive 14 point system review is negative.   PHYSICAL EXAM:   VS:  BP 120/86   Pulse 78   Ht 6' (1.829 m)   Wt 279 lb (126.6 kg)   SpO2 (!) 88%   BMI 37.84  kg/m     Blood pressure by me was 128/84  Wt Readings from Last 3 Encounters:  06/26/23 279 lb (126.6 kg)  06/20/23 279 lb 3.2 oz (126.6 kg)  06/08/23 279 lb  (126.6 kg)    General: Alert, oriented, no distress.  Skin: Severe itching with diffuse erythema over her body HEENT: Normocephalic, atraumatic. Pupils equal round and reactive to light; sclera anicteric; extraocular muscles intact;  Nose without nasal septal hypertrophy Mouth/Parynx benign; Mallinpatti scale 3 Neck: Neck no JVD, no carotid bruits; normal carotid upstroke Lungs: clear to ausculatation and percussion; no wheezing or rales Chest wall: without tenderness to palpitation Heart: PMI not displaced, RRR, s1 s2 normal, 1/6 systolic murmur, no diastolic murmur, no rubs, gallops, thrills, or heaves Abdomen: Antral adiposity soft, nontender; no hepatosplenomehaly, BS+; abdominal aorta nontender and not dilated by palpation. Back: no CVA tenderness Pulses 2+ Musculoskeletal: full range of motion, normal strength, no joint deformities Extremities: Bilateral 2-3+ lower extremity edema to below the knees;  Homan's sign negative  Neurologic: grossly nonfocal; Cranial nerves grossly wnl Psychologic: Normal mood and affect   Studies/Labs Reviewed:   EKG Interpretation Date/Time:  Tuesday June 26 2023 14:05:12 EDT Ventricular Rate:  78 PR Interval:  176 QRS Duration:  92 QT Interval:  354 QTC Calculation: 403 R Axis:   50  Text Interpretation: Sinus rhythm with marked sinus arrhythmia , PACs When compared with ECG of 13-May-2018 11:31, Premature ventricular complexes are no longer Present Confirmed by Nicki Guadalajara ([id number]) on 06/26/2023 2:24:31 PM    Recent Labs:    Latest Ref Rng & Units 05/14/2023   10:11 AM 10/26/2022    2:33 PM 07/25/2022    9:30 AM  BMP  Glucose 70 - 99 mg/dL 85  90  89   BUN 8 - 27 mg/dL 15  10  11    Creatinine 0.76 - 1.27 mg/dL 6.04  5.40  9.81   BUN/Creat Ratio 10 - 24 13  10  12    Sodium 134 - 144 mmol/L 141  136  143   Potassium 3.5 - 5.2 mmol/L 4.5  4.3  4.2   Chloride 96 - 106 mmol/L 102  100  102   CO2 20 - 29 mmol/L 25  26  27    Calcium 8.6  - 10.2 mg/dL 9.2  9.1  9.5         Latest Ref Rng & Units 05/14/2023   10:11 AM 10/26/2022    2:33 PM 07/25/2022    9:30 AM  Hepatic Function  Total Protein 6.0 - 8.5 g/dL 6.8  6.9  6.7   Albumin 3.8 - 4.8 g/dL 4.1  4.2  4.1   AST 0 - 40 IU/L 23  21  20    ALT 0 - 44 IU/L 18  18  20    Alk Phosphatase 44 - 121 IU/L 76  75  68   Total Bilirubin 0.0 - 1.2 mg/dL 0.4  0.4  0.3        Latest Ref Rng & Units 05/14/2023   10:11 AM 10/26/2022    2:33 PM 07/25/2022    9:30 AM  CBC  WBC 3.4 - 10.8 x10E3/uL 8.1  9.1  7.8   Hemoglobin 13.0 - 17.7 g/dL 19.1  47.8  29.5   Hematocrit 37.5 - 51.0 % 47.6  48.0  49.2   Platelets 150 - 450 x10E3/uL 165  181  151    Lab Results  Component Value Date   MCV 95  05/14/2023   MCV 95 10/26/2022   MCV 94 07/25/2022   Lab Results  Component Value Date   TSH 1.540 05/14/2023   Lab Results  Component Value Date   HGBA1C 5.8 (H) 05/14/2023     BNP    Component Value Date/Time   BNP 25.3 12/05/2022 1432    ProBNP No results found for: "PROBNP"   Lipid Panel     Component Value Date/Time   CHOL 203 (H) 05/14/2023 1011   TRIG 120 05/14/2023 1011   HDL 37 (L) 05/14/2023 1011   CHOLHDL 5.5 (H) 05/14/2023 1011   CHOLHDL 6 08/05/2014 0928   VLDL 23.8 08/05/2014 0928   LDLCALC 144 (H) 05/14/2023 1011   LDLDIRECT 191.5 12/12/2011 0901   LABVLDL 22 05/14/2023 1011     RADIOLOGY: DG ESOPHAGUS W DOUBLE CM (HD)  Result Date: 06/06/2023 CLINICAL DATA:  Patient with complaints of dysphagia and the feeling of food being stuck in his throat. EXAM: ESOPHAGUS/BARIUM SWALLOW/TABLET STUDY TECHNIQUE: Combined double and single contrast examination was performed using effervescent crystals, high-density barium, and thin liquid barium. This exam was performed by Alwyn Ren NP, and was supervised and interpreted by Dr. Leanna Battles. FLUOROSCOPY: Radiation Exposure Index (as provided by the fluoroscopic device): 35.50 mGy Kerma COMPARISON:  None  Available. FINDINGS: Swallowing: Vestibular penetration without aspiration. Pharynx: Unremarkable. Esophagus: Large posterior Zenker's diverticulum. Esophageal motility: Within normal limits. Hiatal Hernia: None. Gastroesophageal reflux: None visualized. Ingested 13mm barium tablet: Passed normally. Other: None. IMPRESSION: Limited exam due to patient's inability to tolerate the study. Vestibular penetration without aspiration. Large posterior Zenker's diverticulum. Electronically Signed   By: Leanna Battles M.D.   On: 06/06/2023 12:09     Additional studies/ records that were reviewed today include:  Records from vascular surgery.  Records from [facility] in May 2022 was reviewed.  Records from [facility] were reviewed concerning potential management for his Zenker's diverticulum   ASSESSMENT:    1. History of abdominal aortic aneurysm (AAA) repair   2. Coronary artery disease involving native coronary artery of native heart without angina pectoris   3. Dyspnea on exertion   4. Bilateral lower extremity edema   5. PAF (paroxysmal atrial fibrillation) (HCC)   6. Anxiety   7. Chronic pruritus   8. Current use of long term anticoagulation   9. Centrilobular emphysema (HCC)   10. Class 2 severe obesity due to excess calories with serious comorbidity and body mass index (BMI) of 37.0 to 37.9 in adult [facility])      PLAN:  Hershal Machon is a 72 year old gentleman with a history of obesity, hypertension, referral vascular disease, status post abdominal aortic aneurysm with ultimate EVAR by Dr. Imogene Burn in 2015.  He has had follow-up abdominal ultrasounds demonstrating continued stability.  On October 05, 2022 CT angio of his chest and aorta showed stable fusiform aneurysmal dilation of the ascending thoracic aorta with a maximum transverse diameter of 4.6 cm.  There was evidence for mild central lobular pulmonary emphysema.  He had scattered aortic and coronary artery vascular  calcifications.  He is here today for difficulty swallowing.  He has experienced some choking episodes on solid foods requiring Heimlich maneuver as well as choking on liquids on regular basis.  He has been found to have a large Zenker's diverticulum which due to its large size of his esophageal patch will most likely require an open surgical approach.  His major complaint today however is that of his  chronic itching 24 hours a day with diffuse erythema and redness.  He is moderately obese.  Does have lower extremity edema 2-3+ to just below his knees.  Currently has a history of hyperlipidemia but had stopped taking statin therapy with rosuvastatin.  Presently, I recommended initiation of furosemide 40 mg daily for the next 3 to 5 days and depending upon his clinical response he can reduce to 20 mg.  He has a history of hypertension and has been on lisinopril 20 mg.  I am scheduling him an echo Doppler study for assessment of systolic and diastolic function.  He has remote history of atrial fibrillatio in 2022.  He is anticoagulated on Eliquis 5 mg twice a day.  He has significant depressive and anxiety issues and takes BuSpar in addition to Prozac.  I have strongly suggested dermatologic evaluation with his chronic itching and diffuse erythema.  Since he is not on any statin therapy I have suggested initiation of Zetia.  He also was not on any beta-blocker therapy and did suggest the possibility of low-dose metoprolol.  He was scheduled to go to vascular surgery office immediately following today's office visit for follow-up lower extremity Doppler study and evaluation with Dr. Heath Lark.  He is not having any chest pain or anginal symptomatology.  In the future it may be worthwhile to consider coronary CT imaging for assessment of coronary calcification.  This was a very difficult office visit due to the patient's total preoccupation with his itching and rash rather than cardiovascular issues and preoperative  evaluation   Medication Adjustments/Labs and Tests Ordered: Current medicines are reviewed at length with the patient today.  Concerns regarding medicines are outlined above.  Medication changes, Labs and Tests ordered today are listed in the Patient Instructions below. Patient Instructions  Medication Instructions:  Your physician has recommended you make the following change in your medication:   -Start furosemide (lasix) 40mg  for 3-5 days, then 20mg  after that.  -Start ezetimibe (zetia) 10mg  once daily.  *If you need a refill on your cardiac medications before your next appointment, please call your pharmacy*   Testing/Procedures: Your physician has requested that you have an echocardiogram. Echocardiography is a painless test that uses sound waves to create images of your heart. It provides your doctor with information about the size and shape of your heart and how well your heart's chambers and valves are working. This procedure takes approximately one hour. There are no restrictions for this procedure. Please do NOT wear cologne, perfume, aftershave, or lotions (deodorant is allowed). Please arrive 15 minutes prior to your appointment time. This will take place at [address] [place]    Follow-Up: At [facility], you and your health needs are our priority.  As part of our continuing mission to provide you with exceptional heart care, we have created designated Provider Care Teams.  These Care Teams include your primary Cardiologist (physician) and Advanced Practice Providers (APPs -  Physician Assistants and Nurse Practitioners) who all work together to provide you with the care you need, when you need it.  We recommend signing up for the patient portal called "MyChart".  Sign up information is provided on this After Visit Summary.  MyChart is used to connect with patients for Virtual Visits (Telemedicine).  Patients are able to view lab/test results, encounter notes,  upcoming appointments, etc.  Non-urgent messages can be sent to your provider as well.   To learn more about what you can  do with MyChart, go to [email or web address].    Your next appointment:   We will see you on an as needed basis.  Provider:   Dr. Nicki Guadalajara   Signed, Nicki Guadalajara, MD  07/02/2023 3:26 PM    [facility] [address], [place], [place]  [address] Phone: [phone]

## 2023-06-28 ENCOUNTER — Ambulatory Visit (HOSPITAL_COMMUNITY): Admit: 2023-06-28 | Payer: Medicare HMO | Admitting: Gastroenterology

## 2023-06-28 ENCOUNTER — Ambulatory Visit (HOSPITAL_COMMUNITY): Payer: Medicare HMO

## 2023-06-28 ENCOUNTER — Telehealth (INDEPENDENT_AMBULATORY_CARE_PROVIDER_SITE_OTHER): Payer: Self-pay

## 2023-06-28 ENCOUNTER — Inpatient Hospital Stay (HOSPITAL_COMMUNITY)
Admission: RE | Admit: 2023-06-28 | Discharge: 2023-06-28 | Disposition: A | Discharge status: Home / Self Care | Payer: Medicare HMO | Source: Ambulatory Visit | Attending: Otolaryngology | Admitting: Otolaryngology

## 2023-06-28 SURGERY — ESOPHAGOGASTRODUODENOSCOPY (EGD) WITH PROPOFOL
Anesthesia: Monitor Anesthesia Care

## 2023-06-28 NOTE — Telephone Encounter (Signed)
Left message with patient and his daughter as to the status of his CT scan

## 2023-07-02 ENCOUNTER — Encounter: Payer: Self-pay | Admitting: Cardiovascular Disease

## 2023-07-12 ENCOUNTER — Ambulatory Visit: Payer: Medicare HMO | Admitting: Nurse Practitioner

## 2023-07-12 NOTE — Progress Notes (Deleted)
  [name], DNP, AGNP-c [facility]  [address] [place], [place] [address] [phone]  ESTABLISHED PATIENT- Chronic Health and/or Follow-Up Visit  There were no vitals taken for this visit.    Steven Hayden is a 72 y.o. year old male presenting today for evaluation and management of chronic conditions.   Chronic pruritus of unknown etiology.   All ROS negative with exception of what is listed above.   PHYSICAL EXAM Physical Exam {Physical Exam:31112}  PLAN Problem List Items Addressed This Visit   None   No follow-ups on file.  [name], DNP, AGNP-c

## 2023-07-17 ENCOUNTER — Encounter: Payer: Self-pay | Admitting: Nurse Practitioner

## 2023-07-17 ENCOUNTER — Ambulatory Visit (INDEPENDENT_AMBULATORY_CARE_PROVIDER_SITE_OTHER): Payer: Medicare HMO | Admitting: Nurse Practitioner

## 2023-07-17 VITALS — BP 132/72 | HR 70 | Wt 277.2 lb

## 2023-07-17 DIAGNOSIS — L03119 Cellulitis of unspecified part of limb: Secondary | ICD-10-CM

## 2023-07-17 DIAGNOSIS — I480 Paroxysmal atrial fibrillation: Secondary | ICD-10-CM | POA: Diagnosis not present

## 2023-07-17 DIAGNOSIS — R42 Dizziness and giddiness: Secondary | ICD-10-CM | POA: Diagnosis not present

## 2023-07-17 DIAGNOSIS — H3530 Unspecified macular degeneration: Secondary | ICD-10-CM | POA: Diagnosis not present

## 2023-07-17 DIAGNOSIS — G894 Chronic pain syndrome: Secondary | ICD-10-CM

## 2023-07-17 DIAGNOSIS — I714 Abdominal aortic aneurysm, without rupture, unspecified: Secondary | ICD-10-CM

## 2023-07-17 DIAGNOSIS — R296 Repeated falls: Secondary | ICD-10-CM | POA: Diagnosis not present

## 2023-07-17 DIAGNOSIS — L299 Pruritus, unspecified: Secondary | ICD-10-CM | POA: Diagnosis not present

## 2023-07-17 NOTE — Progress Notes (Signed)
[name], DNP, AGNP-c [facility]  [address] [place], [place] [address] [phone]  ESTABLISHED PATIENT- Chronic Health and/or Follow-Up Visit  Blood pressure 132/72, pulse 70, weight 277 lb 3.2 oz (125.7 kg).    Steven Hayden is a 72 y.o. year old male presenting today for evaluation and management of chronic conditions.   History of Present Illness Steven Hayden presents with a history of multiple health issues, including frequent falls, leg swelling, and a persistent itch. He reports a recent increase in falls, describing them as sudden and unprovoked, often resulting in injury. The patient also experiences dizziness, which he describes as a feeling of blacking out, particularly when rising from a seated position.  The patient has been experiencing significant leg swelling, which has been diagnosed as cellulitis and is currently being treated with doxycycline. He reports an improvement in the swelling since starting the medication.  In addition to these issues, the patient had been dealing with a persistent itch, which he describes as severe enough to have considered suicide. The itch has recently resolved, which the patient attributes to the doxycycline. Multiple treatment options were tried prior to the doxycycline with little effectiveness of management.   The patient also reports pain between the shoulder blades, which has been present for approximately a week. The pain is exacerbated by coughing. He has a history of an upper aneurysm, which is being monitored every six months, and a lower aneurysm, which was treated with stents.  The patient has a history of atrial fibrillation and is prescribed metoprolol, although he admits to not taking the medication regularly. He also reports a history of cataract surgery and partial vision loss in one eye.  The patient is scheduled for a surgical procedure to remove a growth in his throat, which has been causing  difficulty swallowing. He is currently seeking clearance from his heart and lung doctors for the procedure.  The patient lives alone and has been struggling with mobility due to his frequent falls and leg swelling. He expresses a need for a walker to assist with mobility and prevent further falls.  All ROS negative with exception of what is listed above.   PHYSICAL EXAM Physical Exam Vitals and nursing note reviewed.  Constitutional:      Appearance: Normal appearance. He is obese.  HENT:     Head: Normocephalic.  Eyes:     Conjunctiva/sclera: Conjunctivae normal.     Pupils: Pupils are equal, round, and reactive to light.  Neck:     Vascular: Carotid bruit present.  Cardiovascular:     Rate and Rhythm: Normal rate. Rhythm irregular.     Pulses: Normal pulses.     Heart sounds: Normal heart sounds.  Pulmonary:     Effort: Pulmonary effort is normal.     Breath sounds: Normal breath sounds.  Abdominal:     General: Bowel sounds are normal.  Musculoskeletal:        General: Swelling present.     Right lower leg: Edema present.     Left lower leg: Edema present.  Lymphadenopathy:     Cervical: No cervical adenopathy.  Skin:    General: Skin is warm and dry.     Capillary Refill: Capillary refill takes less than 2 seconds.     Findings: Erythema present.  Neurological:     Mental Status: He is alert and oriented to person, place, and time.     Sensory: Sensory deficit present.     Motor: Weakness present.  Gait: Gait abnormal.      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 Last CT angiogram in February showed no change. Regular monitoring every six months. Review of the imaging with patient in the office today and discussed findings and recommendations.  - Schedule follow-up CT angiogram in 6 months. - Continue monitoring with Dr. Mervin Hack         Paroxysmal atrial fibrillation [facility])    Atrial fibrillation, not taking prescribed metoprolol. Discussed importance of metoprolol in preventing high heart rates and reducing stroke risk.   - Resume metoprolol as prescribed   - Educate on the importance of medication adherence         Chronic pruritus    Sudden cessation of chronic pruritus when doxycycline initiated for cellulitis. We discussed that this medication could be curative, if the itching was a bacterial cause, or it could be something we need to continue. We will monitor this closely.       Cellulitis of lower extremity    Cellulitis in the leg, currently treated with doxycycline. Reports significant improvement in swelling and pain. Discussed the importance of completing the antibiotic course and elevating the leg to reduce swelling.   - Continue doxycycline as prescribed   - Advise to elevate leg while sleeping   - Instruct to report any recurrence of symptoms         Dizziness   Chronic pain syndrome    Chronic pain in various locations, possibly musculoskeletal. Pain exacerbated by coughing. Discussed the use of ibuprofen for pain management.   - Recommend ibuprofen for pain management   - Monitor for any changes or worsening of symptoms         Frequent falls    Reports frequent falls, sometimes associated with dizziness. Possible causes include neuropathy and cardiovascular issues. Cardiologist has ordered an echocardiogram to assess heart function. Discussed the importance of standing up slowly to prevent dizziness and falls.   - Order echocardiogram   - Recommend use of a rollator with a seat for stability   - Advise to stand up slowly and stabilize before walking        Other Visit Diagnoses     Macular degeneration of left eye, unspecified type       Relevant Orders   Ambulatory referral to Ophthalmology       Return in about 4 months (around 11/14/2023).  [name], DNP, AGNP-c Time: 46  minutes, >50% spent counseling, care coordination, chart review, and documentation.

## 2023-07-17 NOTE — Patient Instructions (Addendum)
When you stop the doxycycline if the itch comes back, please let me know and we can restart this.   Dr. Alla German is monitoring your aneurysm in your chest every 6 months. It's about time to see him again.   I put the referral for the eye doctor.  I am going to order the walker for you.   Try ibuprofen to see if it helps with the back.

## 2023-07-18 ENCOUNTER — Ambulatory Visit (HOSPITAL_COMMUNITY): Payer: Medicare HMO | Attending: Cardiovascular Disease

## 2023-07-18 DIAGNOSIS — Z9889 Other specified postprocedural states: Secondary | ICD-10-CM | POA: Insufficient documentation

## 2023-07-18 DIAGNOSIS — R0609 Other forms of dyspnea: Secondary | ICD-10-CM | POA: Diagnosis not present

## 2023-07-18 DIAGNOSIS — I2510 Atherosclerotic heart disease of native coronary artery without angina pectoris: Secondary | ICD-10-CM | POA: Insufficient documentation

## 2023-07-18 DIAGNOSIS — R600 Localized edema: Secondary | ICD-10-CM

## 2023-07-18 LAB — ECHOCARDIOGRAM COMPLETE: S' Lateral: 3.47 cm

## 2023-07-24 ENCOUNTER — Encounter: Payer: Self-pay | Admitting: Nurse Practitioner

## 2023-07-24 DIAGNOSIS — L03119 Cellulitis of unspecified part of limb: Secondary | ICD-10-CM | POA: Insufficient documentation

## 2023-07-24 DIAGNOSIS — R296 Repeated falls: Secondary | ICD-10-CM | POA: Insufficient documentation

## 2023-07-24 DIAGNOSIS — G894 Chronic pain syndrome: Secondary | ICD-10-CM | POA: Insufficient documentation

## 2023-07-24 DIAGNOSIS — R42 Dizziness and giddiness: Secondary | ICD-10-CM | POA: Insufficient documentation

## 2023-07-24 NOTE — Assessment & Plan Note (Signed)
Atrial fibrillation, not taking prescribed metoprolol. Discussed importance of metoprolol in preventing high heart rates and reducing stroke risk.   - Resume metoprolol as prescribed   - Educate on the importance of medication adherence

## 2023-07-24 NOTE — Assessment & Plan Note (Signed)
Chronic pain in various locations, possibly musculoskeletal. Pain exacerbated by coughing. Discussed the use of ibuprofen for pain management.   - Recommend ibuprofen for pain management   - Monitor for any changes or worsening of symptoms

## 2023-07-24 NOTE — Assessment & Plan Note (Signed)
Cellulitis in the leg, currently treated with doxycycline. Reports significant improvement in swelling and pain. Discussed the importance of completing the antibiotic course and elevating the leg to reduce swelling.   - Continue doxycycline as prescribed   - Advise to elevate leg while sleeping   - Instruct to report any recurrence of symptoms

## 2023-07-24 NOTE — Assessment & Plan Note (Signed)
Sudden cessation of chronic pruritus when doxycycline initiated for cellulitis. We discussed that this medication could be curative, if the itching was a bacterial cause, or it could be something we need to continue. We will monitor this closely.

## 2023-07-24 NOTE — Assessment & Plan Note (Signed)
>>  ASSESSMENT AND PLAN FOR AAA (ABDOMINAL AORTIC ANEURYSM) WITHOUT RUPTURE (HCC) WRITTEN ON 07/24/2023  5:38 PM BY Briceson Broadwater E, NP  Thoracic aortic aneurysm measuring 4 cm. Last CT angiogram in February showed no change. Regular monitoring every six months. Review of the imaging with patient in the office today and discussed findings and recommendations.  - Schedule follow-up CT angiogram in 6 months. - Continue monitoring with Dr. Fleeta Bradford

## 2023-07-24 NOTE — Assessment & Plan Note (Signed)
Thoracic aortic aneurysm measuring 4 cm. Last CT angiogram in February showed no change. Regular monitoring every six months. Review of the imaging with patient in the office today and discussed findings and recommendations.  - Schedule follow-up CT angiogram in 6 months. - Continue monitoring with Dr. Mervin Hack

## 2023-07-24 NOTE — Assessment & Plan Note (Signed)
Reports frequent falls, sometimes associated with dizziness. Possible causes include neuropathy and cardiovascular issues. Cardiologist has ordered an echocardiogram to assess heart function. Discussed the importance of standing up slowly to prevent dizziness and falls.   - Order echocardiogram   - Recommend use of a rollator with a seat for stability   - Advise to stand up slowly and stabilize before walking

## 2023-07-26 ENCOUNTER — Other Ambulatory Visit: Payer: Self-pay

## 2023-07-26 MED ORDER — FUROSEMIDE 20 MG PO TABS: 20.0000 mg | ORAL_TABLET | Freq: Every day | ORAL | 10 refills | Status: DC

## 2023-07-31 ENCOUNTER — Telehealth: Payer: Self-pay | Admitting: Nurse Practitioner

## 2023-07-31 DIAGNOSIS — L2089 Other atopic dermatitis: Secondary | ICD-10-CM | POA: Diagnosis not present

## 2023-07-31 DIAGNOSIS — L2989 Other pruritus: Secondary | ICD-10-CM | POA: Diagnosis not present

## 2023-07-31 NOTE — Telephone Encounter (Signed)
[employer] needs Income statement,  Called pt and informed this, he doesn't  know where his SS statement is, advised so online at [email or web address] & can email it to me

## 2023-08-06 ENCOUNTER — Ambulatory Visit (INDEPENDENT_AMBULATORY_CARE_PROVIDER_SITE_OTHER): Payer: Medicare HMO | Admitting: Otolaryngology

## 2023-08-21 ENCOUNTER — Ambulatory Visit: Payer: Medicare HMO | Admitting: Vascular Surgery

## 2023-08-21 ENCOUNTER — Encounter (HOSPITAL_COMMUNITY): Payer: Medicare HMO

## 2023-08-23 ENCOUNTER — Encounter: Payer: Self-pay | Admitting: Family Medicine

## 2023-08-23 ENCOUNTER — Ambulatory Visit: Payer: Medicare HMO | Admitting: Family Medicine

## 2023-08-23 VITALS — BP 134/80 | HR 75 | Temp 98.0°F | Wt 280.2 lb

## 2023-08-23 DIAGNOSIS — B354 Tinea corporis: Secondary | ICD-10-CM

## 2023-08-23 DIAGNOSIS — L299 Pruritus, unspecified: Secondary | ICD-10-CM

## 2023-08-23 DIAGNOSIS — L03116 Cellulitis of left lower limb: Secondary | ICD-10-CM

## 2023-08-23 DIAGNOSIS — R600 Localized edema: Secondary | ICD-10-CM | POA: Diagnosis not present

## 2023-08-23 DIAGNOSIS — L853 Xerosis cutis: Secondary | ICD-10-CM

## 2023-08-23 MED ORDER — DOXYCYCLINE HYCLATE 100 MG PO TABS
100.0000 mg | ORAL_TABLET | Freq: Two times a day (BID) | ORAL | 0 refills | Status: DC
Start: 2023-08-23 — End: 2024-01-09

## 2023-08-23 MED ORDER — FLUCONAZOLE 150 MG PO TABS: 150.0000 mg | ORAL_TABLET | ORAL | 0 refills | Status: DC

## 2023-08-23 NOTE — Progress Notes (Signed)
Chief Complaint  Patient presents with   other    Swelling in both legs and feet, about 3-4 days, rt. Leg been swollen for months, and lt. Leg just started swelling a couple of days ago, pain and some numbness, itching also bad again, needs refill on furosemide, doxycycline, and hydroxyzine. Visibly SOB, pt. Usually is when he comes in   "You've got to make the itching stop! I'm suicidal!" He brought in bottles of furosemide, doxy and hydroxyzine that were empty.  He also states that the hydroxyzine never helped with his itching.  He states he has been itching for 5 years. Upon chart review, it was noted that he had improvement in itching when he was on the doxycycline. He stated today that his itching never got better, but when advised of what was previously reported in notes, he stated  "If that is what helped, then I want that refilled"--doesn't specifically recall itching improving when he took the doxycycline course in the past. He was rather demanding in wanting the SAME length (#42) He reports today that the itching is twice as bad.  He states his legs have been swollen for months.  At one point they went back to normal.  Then the right started with recurrent swelling, and 4 days ago the left leg started swelling. Itching is all over his upper body, groin, legs.  Has NO itching in his calves/lower legs, where swollen.  Started to elevate his legs last night--legs felt better.  He needs to use a cane to stand up--helps him with his balance and prevents falls (he used to fall a lot). He sometimes feels dizzy when he stands, not all the time.  "I mostly eat junk food"--mostly cookies, pies, cheese and crackers.  Had a can of soup yesterday.  Chart reviewed-- He saw his PCP 07/17/23--At that time he described dizziness, esp with rising from seated position, pre-syncopal.  The patient has been experiencing significant leg swelling, which has been diagnosed as cellulitis and he was being  treated with doxycycline. He reports an improvement in the swelling since starting the medication.  He saw Dr. Tresa Endo in October. Echo was repeated in 07/2023--Normal LV function with EF 55 to 60%.  Mild concentric LVH.  Moderate mitral annular calcification without stenosis or leakage.  Mild calcification of aortic valve without stenosis.  Moderate dilation of ascending aorta at 42 mm.   He is scheduled for VAS [place] LOWER EXTREMITY VENOUS REFLUX  on 09/04/23.  H/o AAA, s/p EVAR on 05/18/2014. Gets regular surveillance/monitoring by vascular. Denies abdominal pain.  Planning ozempic soon, getting through Patient Assistance Program.   PMH, [facility], SH reviewed #[id number] 06/20/23 of doxy  Outpatient Encounter Medications as of 08/23/2023  Medication Sig Note   albuterol (VENTOLIN HFA) 108 (90 Base) MCG/ACT inhaler Inhale 2 puffs into the lungs every 6 (six) hours as needed for wheezing or shortness of breath. 05/14/2023: prn   apixaban (ELIQUIS) 5 MG TABS tablet Take 1 tablet (5 mg total) by mouth 2 (two) times daily.    busPIRone (BUSPAR) 5 MG tablet Take 1 tablet (5 mg total) by mouth 2 (two) times daily.    Dermatological Products, Misc. [facility]) lotion apply to body daily for maintenance    doxycycline (VIBRA-TABS) 100 MG tablet Take 1 tablet (100 mg total) by mouth 2 (two) times daily.    ezetimibe (ZETIA) 10 MG tablet Take 1 tablet (10 mg total) by mouth daily.    fluconazole (DIFLUCAN) 150 MG tablet Take  1 tablet (150 mg total) by mouth once a week.    Fluticasone-Umeclidin-Vilant (TRELEGY ELLIPTA) 200-62.5-25 MCG/ACT AEPB INHALE ONE PUFF BY MOUTH DAILY    furosemide (LASIX) 20 MG tablet Take 1 tablet (20 mg total) by mouth daily.    gabapentin (NEURONTIN) 300 MG capsule Take 2-3 capsules twice a day. Take 4 capsules at bedtime.    isoniazid (NYDRAZID) 300 MG tablet Take 300 mg by mouth daily.    levothyroxine (SYNTHROID) 25 MCG tablet Take 1 tablet (25 mcg total) by mouth daily. Take 30 minutes  before any food, without any other medication.    lisinopril (ZESTRIL) 20 MG tablet Take 1 tablet (20 mg total) by mouth daily. 07/17/2023: Would like to take two of them    omeprazole (PRILOSEC) 40 MG capsule Take 1 capsule (40 mg total) by mouth daily before breakfast.    [DISCONTINUED] doxycycline (VIBRA-TABS) 100 MG tablet Take 1 tablet (100 mg total) by mouth 2 (two) times daily.    No facility-administered encounter medications on file as of 08/23/2023.   NOT taking doxy or fluconazole prior to today's visit  Allergies  Allergen Reactions   Codeine Phosphate     REACTION: swelling of uvula, hives   Dilaudid [Hydromorphone]    Pravastatin Sodium Other (See Comments)    ROS: no fever or chills. No nausea or vomiting.  Some mild diarrhea after having a lot of egg nog recently. Denies rash, other than "some spots that pop up every once in a while"--rash on right forearms, and some lesions on his L upper thigh. Complains that he sweats frequently. Breathing stable, no cough.    PHYSICAL EXAM:  BP 134/80   Pulse 75   Temp 98 F (36.7 C)   Wt 280 lb 3.2 oz (127.1 kg)   SpO2 92%   BMI 38.00 kg/m   Wt Readings from Last 3 Encounters:  08/23/23 280 lb 3.2 oz (127.1 kg)  07/17/23 277 lb 3.2 oz (125.7 kg)  06/26/23 279 lb (126.6 kg)   Smells of cigarette smoke. Some shortness of breath with activity, but speaks comfortably, in no distress. HEENT: conjunctiva and sclera are clear, EOMI Neck: no lymphadenopathy or mass Heart: regular rate and rhythm Lungs clear bilaterally without wheezing Abdomen: obese, soft, nontender Extremities: 1+ pitting edema RLE, trace on the left Small scab in mid shin, with minimal erythema around it. Thickened, curled great toenails, L>R Slight maceration between R 4th and 5th toes Skin: 1/2-dollar sized circular lesion on the right forearm, with central clearing. Small dry patches to left upper thigh (no central clearing). Very dry skin on  lower extremities Psych: intermittently demanding and pleasant, intermittently agitated.  Mentioned suicidality due to itching, no plan, more the way he describes the severity of his symptoms.  Full range of affect. Neuro: alert and oriented. Cranial nerves grossly intact.  Decreased sensation on feet. Ambulates with cane.     ASSESSMENT/PLAN:   Lower extremity edema - recently recurring; scheduled for venous US soon. Normal echo. Reviewed low sodium diet, leg elevation, rec compression socks  Chronic pruritus - long-standing. ?benefit from doxy in past. Poss early cellulitis in LE's. Poss id reaction from fungus. Will treat for both, f/u with PCP  Tinea corporis - noted on forearm. Given diffuse pruritis,and also poss tinea pedis, will treat with oral antifungal - Plan: fluconazole (DIFLUCAN) 150 MG tablet  Dry skin - encouraged adequate hydration, avoidance of prolonged hot water, and regular moisturizing  Cellulitis of left lower extremity -  recent LLE swelling, with scab present, slight erythema. Will cover for cellulitis, also to appease pt's demands for the antibiotic. 10d only - Plan: doxycycline (VIBRA-TABS) 100 MG tablet  Normal recent LFT's, no contraindication to antifungal Lab Results  Component Value Date   ALT 18 05/14/2023   AST 23 05/14/2023   ALKPHOS 76 05/14/2023   BILITOT 0.4 05/14/2023    I spent 45 minutes dedicated to the care of this patient, including pre-visit review of records, face to face time, post-visit ordering of testing and documentation.   F/u with PCP in 4 wks

## 2023-08-23 NOTE — Patient Instructions (Addendum)
Please cut back on the sodium (salt) in your diet. Try and cut back on your "junk"--cookies, pies, and eat more vegetables, fruit, protein.  Try and keep your legs elevated (above the level of the heart) in order to decrease your swelling. Wearing compression socks can also help (put these on first thing in the morning).  Take the fluconazole once a week for 4 weeks. This is to treat a fungal infection which causes itchy skin. We are also putting you on doxycycline which is an antibiotic which covers skin infections.  Continue to moisturize your skin, and try and avoid scratching.

## 2023-08-24 ENCOUNTER — Ambulatory Visit: Payer: Medicare HMO | Admitting: Medical

## 2023-08-25 ENCOUNTER — Other Ambulatory Visit: Payer: Self-pay | Admitting: Nurse Practitioner

## 2023-08-25 DIAGNOSIS — F458 Other somatoform disorders: Secondary | ICD-10-CM

## 2023-08-27 ENCOUNTER — Other Ambulatory Visit: Payer: Self-pay | Admitting: *Deleted

## 2023-08-27 NOTE — Telephone Encounter (Signed)
Is this okay to refill? 

## 2023-08-28 MED ORDER — AMITRIPTYLINE HCL 50 MG PO TABS: 50.0000 mg | ORAL_TABLET | Freq: Every day | ORAL | 1 refills | Status: DC

## 2023-09-03 NOTE — Progress Notes (Signed)
 VASC[facility]ISTS OF [place]  ASSESSMENT / PLAN: Steven Hayden is a 72 y.o. male with chronic venous insufficiency of bilateral lower extremity causing eczematous skin changes and swelling (C4 disease).  Venous duplex is significant for deep venous reflux; no greater saphenous vein reflux Recommend compression and elevation for symptomatic relief. Follow up with me as needed.  CHIEF COMPLAINT: Swollen legs.  HISTORY OF PRESENT ILLNESS: Steven Hayden is a 72 y.o. male referred to clinic for evaluation of bilateral lower extremity swelling.  Patient reports a 20-month history of severe swelling in the lower extremities.  This is quite bothersome to him.  His main complaint is itchiness.  He has diffuse pruritus for which no causes been identified.  He is understandably frustrated about this.  We reviewed his noninvasive testing in detail.  VASCULAR SURGICAL HISTORY: EVAR 05/18/2014   Past Medical History:  Diagnosis Date   AAA (abdominal aortic aneurysm) (HCC)    ADD (attention deficit disorder)    Atrial fibrillation (HCC)    Bipolar affective disorder (HCC)    Cigarette smoker motivated to quit 04/15/2020   COPD (chronic obstructive pulmonary disease) (HCC)    Hyperlipidemia    Hypertension    Lichenification 10/27/2022   Obesity (BMI 30-39.9) 04/15/2020   Prediabetes    Hgb A1c 6.4% August 2021   Schizophrenia [facility])    Statin declined 04/16/2020   States he will not take a statin. Has taken them in past.    Statin declined 04/16/2020   States he will not take a statin. Has taken them in past.      Tobacco abuse    TOBACCO USE 07/14/2008   Qualifier: Diagnosis of   By: Tammie MD, Bruce        Past Surgical History:  Procedure Laterality Date   ABDOMINAL AORTIC ANEURYSM REPAIR     ABDOMINAL AORTIC ENDOVASCULAR STENT GRAFT N/A 05/18/2014   Procedure: ABDOMINAL AORTIC ENDOVASCULAR STENT GRAFT;  Surgeon: Redell LITTIE Door, MD;  Location: [facility] OR;  Service: Vascular;   Laterality: N/A;   cataract Bilateral    08/2022 and 09/2022   KNEE SURGERY     bilateral    Family History  Problem Relation Age of Onset   Hypertension Brother    Hyperlipidemia Brother    Hypertension Father    Hyperlipidemia Father    Cancer Father     Social History   Socioeconomic History   Marital status: Divorced    Spouse name: Not on file   Number of children: 1   Years of education: Not on file   Highest education level: Not on file  Occupational History   Not on file  Tobacco Use   Smoking status: Every Day    Current packs/day: 0.50    Average packs/day: 0.5 packs/day for 50.0 years (25.0 ttl pk-yrs)    Types: Cigarettes    Passive exposure: Current   Smokeless tobacco: Never   Tobacco comments:    currently smoking 1ppd as of 10/07/20  Vaping Use   Vaping status: Never Used  Substance and Sexual Activity   Alcohol  use: Yes    Alcohol /week: 6.0 standard drinks of alcohol     Types: 6 Shots of liquor per week    Comment: 6 shots of liquor a week   Drug use: No   Sexual activity: Not Currently  Other Topics Concern   Not on file  Social History Narrative   Not on file   Social Drivers of Health  Financial Resource Strain: Low Risk  (11/20/2022)   Overall Financial Resource Strain (CARDIA)    Difficulty of Paying Living Expenses: Not hard at all  Food Insecurity: No Food Insecurity (11/21/2022)   Hunger Vital Sign    Worried About Running Out of Food in the Last Year: Never true    Ran Out of Food in the Last Year: Never true  Transportation Needs: No Transportation Needs (11/20/2022)   PRAPARE - Administrator, Civil Service (Medical): No    Lack of Transportation (Non-Medical): No  Physical Activity: Inactive (11/20/2022)   Exercise Vital Sign    Days of Exercise per Week: 0 days    Minutes of Exercise per Session: 0 min  Stress: No Stress Concern Present (11/20/2022)   [employer] of Occupational Health - Occupational Stress  Questionnaire    Feeling of Stress : Only a little  Social Connections: Unknown (11/20/2022)   Social Connection and Isolation Panel [NHANES]    Frequency of Communication with Friends and Family: More than three times a week    Frequency of Social Gatherings with Friends and Family: Twice a week    Attends Religious Services: Not on Marketing Executive or Organizations: Yes    Attends Banker Meetings: Never    Marital Status: Divorced  Catering Manager Violence: Unknown (04/02/2022)   Received from [employer], [facility]   HITS    Physically Hurt: Not on file    Insult or Talk Down To: Not on file    Threaten Physical Harm: Not on file    Scream or Curse: Not on file    Allergies  Allergen Reactions   Codeine Phosphate     REACTION: swelling of uvula, hives   Dilaudid  [Hydromorphone ]    Pravastatin Sodium Other (See Comments)    Current Outpatient Medications  Medication Sig Dispense Refill   albuterol  (VENTOLIN  HFA) 108 (90 Base) MCG/ACT inhaler Inhale 2 puffs into the lungs every 6 (six) hours as needed for wheezing or shortness of breath. 18 g 2   amitriptyline  (ELAVIL ) 50 MG tablet Take 1 tablet (50 mg total) by mouth at bedtime. 90 tablet 1   apixaban  (ELIQUIS ) 5 MG TABS tablet Take 1 tablet (5 mg total) by mouth 2 (two) times daily. 180 tablet 3   busPIRone  (BUSPAR ) 5 MG tablet Take 1 tablet (5 mg total) by mouth 2 (two) times daily. 180 tablet 3   Dermatological Products, Misc. [facility]) lotion apply to body daily for maintenance     doxycycline  (VIBRA -TABS) 100 MG tablet Take 1 tablet (100 mg total) by mouth 2 (two) times daily. 20 tablet 0   ezetimibe  (ZETIA ) 10 MG tablet Take 1 tablet (10 mg total) by mouth daily. 90 tablet 3   fluconazole  (DIFLUCAN ) 150 MG tablet Take 1 tablet (150 mg total) by mouth once a week. 4 tablet 0   Fluticasone-Umeclidin-Vilant (TRELEGY ELLIPTA ) 200-62.5-25 MCG/ACT AEPB INHALE ONE PUFF BY MOUTH DAILY 60 each 11    furosemide  (LASIX ) 20 MG tablet Take 1 tablet (20 mg total) by mouth daily. 30 tablet 10   gabapentin  (NEURONTIN ) 300 MG capsule Take 2-3 capsules twice a day. Take 4 capsules at bedtime. 900 capsule 3   isoniazid  (NYDRAZID ) 300 MG tablet Take 300 mg by mouth daily.     levothyroxine  (SYNTHROID ) 25 MCG tablet Take 1 tablet (25 mcg total) by mouth daily. Take 30 minutes before any food, without any other medication. 90  tablet 3   lisinopril  (ZESTRIL ) 20 MG tablet Take 1 tablet (20 mg total) by mouth daily. 90 tablet 3   omeprazole  (PRILOSEC) 40 MG capsule Take 1 capsule (40 mg total) by mouth daily before breakfast. 90 capsule 0   No current facility-administered medications for this visit.    PHYSICAL EXAM Vitals:   09/04/23 1517  BP: 132/82  Pulse: 68  Resp: 20  Temp: 98 F (36.7 C)  SpO2: 91%  Weight: 277 lb (125.6 kg)  Height: 6' (1.829 m)    Swollen lower extremities with eczematous skin changes over the pretibial skin typical of chronic venous insufficiency  PERTINENT LABORATORY AND RADIOLOGIC DATA  Most recent CBC    Latest Ref Rng & Units 05/14/2023   10:11 AM 10/26/2022    2:33 PM 07/25/2022    9:30 AM  CBC  WBC 3.4 - 10.8 x10E3/uL 8.1  9.1  7.8   Hemoglobin 13.0 - 17.7 g/dL 83.8  83.6  82.9   Hematocrit 37.5 - 51.0 % 47.6  48.0  49.2   Platelets 150 - 450 x10E3/uL 165  181  151      Most recent CMP    Latest Ref Rng & Units 05/14/2023   10:11 AM 10/26/2022    2:33 PM 07/25/2022    9:30 AM  CMP  Glucose 70 - 99 mg/dL 85  90  89   BUN 8 - 27 mg/dL 15  10  11    Creatinine 0.76 - 1.27 mg/dL 8.87  9.00  9.06   Sodium 134 - 144 mmol/L 141  136  143   Potassium 3.5 - 5.2 mmol/L 4.5  4.3  4.2   Chloride 96 - 106 mmol/L 102  100  102   CO2 20 - 29 mmol/L 25  26  27    Calcium  8.6 - 10.2 mg/dL 9.2  9.1  9.5   Total Protein 6.0 - 8.5 g/dL 6.8  6.9  6.7   Total Bilirubin 0.0 - 1.2 mg/dL 0.4  0.4  0.3   Alkaline Phos 44 - 121 IU/L 76  75  68   AST 0 - 40 IU/L 23  21   20    ALT 0 - 44 IU/L 18  18  20      Renal function CrCl cannot be calculated (Patient's most recent lab result is older than the maximum 21 days allowed.).  Hgb A1c MFr Bld (%)  Date Value  05/14/2023 5.8 (H)    LDL Chol Calc (NIH)  Date Value Ref Range Status  05/14/2023 144 (H) 0 - 99 mg/dL Final   Direct LDL  Date Value Ref Range Status  12/12/2011 191.5 mg/dL Final    Comment:    Optimal:  <100 mg/dLNear or Above Optimal:  100-129 mg/dLBorderline High:  130-159 mg/dLHigh:  160-189 mg/dLVery High:  >190 mg/dL    Venous duplex performed today shows femoral vein reflux without greater saphenous vein reflux.  Debby SAILOR. Magda, MD [facility] of [facility] Phone Number: [phone] 09/04/2023 4:05 PM   Total time spent on preparing this encounter including chart review, data review, collecting history, examining the patient, coordinating care for this new patient, 45 minutes.  Portions of this report may have been transcribed using voice recognition software.  Every effort has been made to ensure accuracy; however, inadvertent computerized transcription errors may still be present.

## 2023-09-04 ENCOUNTER — Ambulatory Visit (HOSPITAL_COMMUNITY)
Admission: RE | Admit: 2023-09-04 | Discharge: 2023-09-04 | Disposition: A | Discharge status: Home / Self Care | Payer: Medicare HMO | Source: Ambulatory Visit | Attending: Vascular Surgery | Admitting: Vascular Surgery

## 2023-09-04 ENCOUNTER — Encounter: Payer: Self-pay | Admitting: Vascular Surgery

## 2023-09-04 ENCOUNTER — Ambulatory Visit (INDEPENDENT_AMBULATORY_CARE_PROVIDER_SITE_OTHER): Payer: Medicare HMO | Admitting: Vascular Surgery

## 2023-09-04 VITALS — BP 132/82 | HR 68 | Temp 98.0°F | Resp 20 | Ht 72.0 in | Wt 277.0 lb

## 2023-09-04 DIAGNOSIS — M7989 Other specified soft tissue disorders: Secondary | ICD-10-CM | POA: Diagnosis not present

## 2023-09-04 DIAGNOSIS — I872 Venous insufficiency (chronic) (peripheral): Secondary | ICD-10-CM

## 2023-09-12 ENCOUNTER — Encounter: Payer: Self-pay | Admitting: Nurse Practitioner

## 2023-09-14 ENCOUNTER — Other Ambulatory Visit: Payer: Self-pay

## 2023-09-19 ENCOUNTER — Telehealth: Payer: Self-pay

## 2023-09-19 NOTE — Telephone Encounter (Signed)
 Pt. Walked into the office stating at his last visit with you you guys discussed him possibly getting a roll-aider. He wanted to check on the status of that. If ok, we can write a script for him and fax it to a DME company such as [facility].

## 2023-09-20 DIAGNOSIS — Z961 Presence of intraocular lens: Secondary | ICD-10-CM | POA: Diagnosis not present

## 2023-09-20 DIAGNOSIS — H353112 Nonexudative age-related macular degeneration, right eye, intermediate dry stage: Secondary | ICD-10-CM | POA: Diagnosis not present

## 2023-09-20 DIAGNOSIS — H35371 Puckering of macula, right eye: Secondary | ICD-10-CM | POA: Diagnosis not present

## 2023-09-20 DIAGNOSIS — H353221 Exudative age-related macular degeneration, left eye, with active choroidal neovascularization: Secondary | ICD-10-CM | POA: Diagnosis not present

## 2023-09-26 DIAGNOSIS — L2089 Other atopic dermatitis: Secondary | ICD-10-CM | POA: Diagnosis not present

## 2023-10-01 ENCOUNTER — Telehealth: Payer: Self-pay

## 2023-10-01 NOTE — Telephone Encounter (Signed)
PAP: Patient assistance application for Ozempic has been approved by PAP Companies: [employer] from 09/22/2023 to 09/21/2024. Medication should be delivered to PAP Delivery: Provider's office. For further shipping updates, please [employer] at [phone]. Patient ID is: [id number]

## 2023-10-01 NOTE — Progress Notes (Signed)
Pharmacy Medication Assistance Program Note    10/01/2023  Patient ID: Steven Hayden, male   DOB: 1951-06-07, 73 y.o.   MRN: [id number]     10/01/2023  [facility] [facility]  Manufacturer Medication One [employer] [facility] Ozempic  Type of Radiographer, therapeutic Assistance  Name of Prescriber Huntley Dec Early  Patient Assistance Determination Approved  Approval Start Date 09/22/2023  Approval End Date 09/21/2024     Signature

## 2023-10-02 ENCOUNTER — Telehealth: Payer: Self-pay

## 2023-10-02 ENCOUNTER — Other Ambulatory Visit (HOSPITAL_COMMUNITY): Payer: Self-pay

## 2023-10-02 NOTE — Telephone Encounter (Signed)
Pharmacy Patient Advocate Encounter   Received notification from CoverMyMeds that prior authorization for Gabapentin  is required/requested.   Insurance verification completed.   The patient is insured through [employer] .   Per test claim: PA required; PA submitted to above mentioned insurance via CoverMyMeds Key/confirmation #/EOC Key: [id number]   Status is pending

## 2023-10-03 ENCOUNTER — Other Ambulatory Visit (HOSPITAL_COMMUNITY): Payer: Self-pay

## 2023-10-03 NOTE — Telephone Encounter (Signed)
Pharmacy Patient Advocate Encounter  Received notification from [facility] that Prior Authorization for Gabapentin has been APPROVED from 1.1.25 to 12.31.25. Ran test claim, Copay is $15.00. This test claim was processed through [facility]- copay amounts may vary at other pharmacies due to pharmacy/plan contracts, or as the patient moves through the different stages of their insurance plan.   PA #/Case ID/Reference #: (Key: [id number])

## 2023-10-09 ENCOUNTER — Telehealth: Payer: Self-pay | Admitting: Nurse Practitioner

## 2023-10-09 NOTE — Telephone Encounter (Signed)
Patient assistance application for Ozempic has been approved by PAP Companies: [employer] from 09/22/2023 to 09/21/2024.  PAP Ozempic received, called pt twice & says call can't be completed on both numbers, sent mychart message

## 2023-10-10 DIAGNOSIS — L2089 Other atopic dermatitis: Secondary | ICD-10-CM | POA: Diagnosis not present

## 2023-10-29 ENCOUNTER — Ambulatory Visit: Payer: Self-pay | Admitting: Nurse Practitioner

## 2023-10-29 NOTE — Telephone Encounter (Signed)
 Copied From CRM [id number]. Reason for Triage: Pitching all over body and itching is getting worse. Patient states it is a chronic itch. Patient requesting appointment and requesting liver to be checked.  Please assist patient further  Chief Complaint: Itching Symptoms: Itching, leg swelling, depression Frequency: Chronic, worsening this week Pertinent Negatives: Patient denies relief Disposition: [] ED /[] Urgent Care (no appt availability in office) / [x] Appointment(In office/virtual)/ []  [place] [facility]/ [] Home Care/ [] Refused Recommended Disposition /[] [place] [facility]/ []  Follow-up with PCP Additional Notes: Patient called in to report that he is experiencing widespread itching. Patient stated the itching has been present for 6 years. Patient stated the itching has become more severe and unbearable this week. Patient stated the only thing he can do to "escape" the itch is scratch himself to sleep. Patient stated he is also experiencing bilateral leg swelling below his knees. Patient stated this is also a chronic issue, but the swelling in his left leg started 3 weeks ago. Patient also stated the veins look more defined in his legs. Patient believes that these symptoms are related to his liver and would like to have labs drawn. Patient expressed to this RN that he has no quality of life. Patient expressed that he is experiencing depression and has had SI before. Patient stated that he did not have a plan at this time to harm himself or others. Patient's PCP does not have availability until 11/19/23. Patient is strongly requesting to be seen this week and does not wish to go to another location to be seen. This RN scheduled patient with alternate provider in the office for later this week. This RN advised patient to call back if anything changes or if he has thoughts of harming himself again. Patient complied.   Reason for Disposition  Itching is a chronic symptom (recurrent or ongoing AND  present > 4 weeks)  Answer Assessment - Initial Assessment Questions 1. DESCRIPTION: "Describe the itching you are having."     States itch is all over his body, states worse in the upper body 2. SEVERITY: "How bad is it?"    - MILD: Doesn't interfere with normal activities.   - MODERATE-SEVERE: Interferes with work, school, sleep, or other activities.      Severe- states all he can do to escape the itch is fall asleep 3. SCRATCHING: "Are there any scratch marks? Bleeding?"     N/A 4. ONSET: "When did this begin?"      6 years  5. CAUSE: "What do you think is causing the itching?" (ask about swimming pools, pollen, animals, soaps, etc.)     Possible liver issue 6. OTHER SYMPTOMS: "Do you have any other symptoms?"      Swelling in bilateral legs  Answer Assessment - Initial Assessment Questions 1. ONSET: "When did the swelling start?" (e.g., minutes, hours, days)     Left leg started swelling 3 weeks ago 2. LOCATION: "What part of the leg is swollen?"  "Are both legs swollen or just one leg?"     States swelling is up to knees in both legs 3. SEVERITY: "How bad is the swelling?" (e.g., localized; mild, moderate, severe)   - Localized: Small area of swelling localized to one leg.   - MILD pedal edema: Swelling limited to foot and ankle, pitting edema < 1/4 inch (6 mm) deep, rest and elevation eliminate most or all swelling.   - MODERATE edema: Swelling of lower leg to knee, pitting edema > 1/4 inch (6 mm)  deep, rest and elevation only partially reduce swelling.   - SEVERE edema: Swelling extends above knee, facial or hand swelling present.      N/A 4. REDNESS: "Does the swelling look red or infected?"     N/A 5. PAIN: "Is the swelling painful to touch?" If Yes, ask: "How painful is it?"   (Scale 1-10; mild, moderate or severe)     N/A 6. FEVER: "Do you have a fever?" If Yes, ask: "What is it, how was it measured, and when did it start?"      N/A 7. CAUSE: "What do you think is  causing the leg swelling?"     Unknown 8. MEDICAL HISTORY: "Do you have a history of blood clots (e.g., DVT), cancer, heart failure, kidney disease, or liver failure?"     N/A 9. RECURRENT SYMPTOM: "Have you had leg swelling before?" If Yes, ask: "When was the last time?" "What happened that time?"     States he has had swelling in his right leg before, but never his left 10. OTHER SYMPTOMS: "Do you have any other symptoms?" (e.g., chest pain, difficulty breathing)       States blood vessels look more defined in bilateral legs  Protocols used: Itching - Widespread-A-AH, Leg Swelling and Edema-A-AH

## 2023-10-29 NOTE — Telephone Encounter (Signed)
 Forward to [facility]

## 2023-10-31 ENCOUNTER — Ambulatory Visit (INDEPENDENT_AMBULATORY_CARE_PROVIDER_SITE_OTHER): Payer: Medicare HMO | Admitting: Family Medicine

## 2023-10-31 ENCOUNTER — Encounter: Payer: Self-pay | Admitting: Family Medicine

## 2023-10-31 VITALS — BP 136/84 | HR 62 | Temp 97.9°F | Wt 280.0 lb

## 2023-10-31 DIAGNOSIS — L299 Pruritus, unspecified: Secondary | ICD-10-CM

## 2023-10-31 MED ORDER — HYDROXYZINE PAMOATE 25 MG PO CAPS
25.0000 mg | ORAL_CAPSULE | Freq: Three times a day (TID) | ORAL | 0 refills | Status: DC | PRN
Start: 2023-10-31 — End: 2024-03-06

## 2023-10-31 NOTE — Progress Notes (Signed)
   Subjective:    Patient ID: Steven Hayden, male    DOB: [birth year], 73 y.o.   MRN: [id number]  HPI Here for consult concerning continued difficulty with pruritus.  He has a long history of this and his seen Dr. Terri Piedra in the past.  He apparently also has an appointment to be seen at [facility] and also at [facility] for his pruritus.  Recently he had tried gabapentin which did not help.  When asked concerning the use of Atarax, he stated he did not think it ever been on that.   Review of Systems     Objective:    Physical Exam Alert and complaining of itching.  He is quite erythematous.       Assessment & Plan:  Chronic pruritus - Plan: hydrOXYzine (VISTARIL) 25 MG capsule Cautioned him on use of this medication as it can make him slightly drowsy and to avoid driving if that is the case.  I will also have him take Tagamet twice per day to see if that will also help with his itching.

## 2023-10-31 NOTE — Patient Instructions (Signed)
 Your chart take the Atarax and also get some medicine called Tagamet and take 1 pill twice per day

## 2023-10-31 NOTE — Telephone Encounter (Signed)
 I saw you are seeing Steven Hayden today for an acute visit. He was a patient of Lynden Ang that came to me when I came to the practice. He has apparently had years of intense pruritus with no rash or known cause.  I have exhausted everything that I can think of for management and nothing helped long term. He reported relief with gabapentin at one point, but this stopped after a few months, later he reported relief with doxycycline, but this has also stopped.    I have referred him to [facility] dermatology in hopes that they would be able to find a solution. It appears he has been put on a wait list with the appointment scheduled in September. He saw another dermatologist at the end of last year and it looks like they started him on Dupixent.   I have considered trial of something like hydroxychloroquine in the event this was a potential autoimmune condition.   My other thoughts are stopping lisinopril and eliquis to see if these are contributing. Any thoughts are appreciated.

## 2023-11-02 ENCOUNTER — Ambulatory Visit: Payer: Medicare HMO | Admitting: Medical

## 2023-11-12 ENCOUNTER — Encounter: Payer: Self-pay | Admitting: Nurse Practitioner

## 2023-11-15 DIAGNOSIS — L2089 Other atopic dermatitis: Secondary | ICD-10-CM | POA: Diagnosis not present

## 2023-11-21 ENCOUNTER — Other Ambulatory Visit: Payer: Self-pay | Admitting: Nurse Practitioner

## 2023-11-21 ENCOUNTER — Other Ambulatory Visit: Payer: Self-pay | Admitting: Medical

## 2023-11-21 DIAGNOSIS — F419 Anxiety disorder, unspecified: Secondary | ICD-10-CM

## 2023-11-21 DIAGNOSIS — I10 Essential (primary) hypertension: Secondary | ICD-10-CM

## 2023-11-21 NOTE — Telephone Encounter (Signed)
 Last apt: 07/17/23

## 2023-11-22 DIAGNOSIS — L2089 Other atopic dermatitis: Secondary | ICD-10-CM | POA: Diagnosis not present

## 2023-11-22 DIAGNOSIS — L308 Other specified dermatitis: Secondary | ICD-10-CM | POA: Diagnosis not present

## 2023-11-22 DIAGNOSIS — L309 Dermatitis, unspecified: Secondary | ICD-10-CM | POA: Diagnosis not present

## 2023-11-27 ENCOUNTER — Ambulatory Visit: Payer: Medicare HMO

## 2023-11-27 DIAGNOSIS — Z0000 Encounter for general adult medical examination without abnormal findings: Secondary | ICD-10-CM

## 2023-11-27 NOTE — Progress Notes (Signed)
 Subjective:   Steven Hayden is a 73 y.o. who presents for a Medicare Wellness preventive visit.  Visit Complete: Virtual I connected with  [name] on 11/27/23 by a audio enabled telemedicine application and verified that I am speaking with the correct person using two identifiers.  Patient Location: Home  Provider Location: Office/Clinic  I discussed the limitations of evaluation and management by telemedicine. The patient expressed understanding and agreed to proceed.  Vital Signs: Because this visit was a virtual/telehealth visit, some criteria may be missing or patient reported. Any vitals not documented were not able to be obtained and vitals that have been documented are patient reported.  VideoError- Librarian, academic were attempted between this provider and patient, however failed, due to patient having technical difficulties OR patient did not have access to video capability.  We continued and completed visit with audio only.   Persons Participating in Visit: Patient.  AWV Questionnaire: No: Patient Medicare AWV questionnaire was not completed prior to this visit.  Cardiac Risk Factors include: advanced age (>65men, >66 women);dyslipidemia;hypertension;male gender     Objective:    Today's Vitals   11/27/23 1006  PainSc: 10-Worst pain ever   There is no height or weight on file to calculate BMI.     11/27/2023   10:20 AM 11/21/2022   11:06 AM 08/11/2021    1:32 PM 05/13/2018   11:30 AM 06/30/2015    2:01 PM 05/17/2014   12:40 PM 05/17/2014   11:12 AM  Advanced Directives  Does Patient Have a Medical Advance Directive? Yes Yes No No No No No  Type of Advance Directive Living will Healthcare Power of [place];Living will       Copy of Healthcare Power of Attorney in Chart?  No - copy requested       Would patient like information on creating a medical advance directive?    No - Patient declined  No - patient declined information      Current Medications (verified) Outpatient Encounter Medications as of 11/27/2023  Medication Sig   albuterol (VENTOLIN HFA) 108 (90 Base) MCG/ACT inhaler Inhale 2 puffs into the lungs every 6 (six) hours as needed for wheezing or shortness of breath.   apixaban (ELIQUIS) 5 MG TABS tablet Take 1 tablet (5 mg total) by mouth 2 (two) times daily.   busPIRone (BUSPAR) 5 MG tablet TAKE 1 TABLET TWICE DAILY   Dermatological Products, Misc. [facility]) lotion apply to body daily for maintenance   Fluticasone-Umeclidin-Vilant (TRELEGY ELLIPTA) 200-62.5-25 MCG/ACT AEPB INHALE ONE PUFF BY MOUTH DAILY   gabapentin (NEURONTIN) 300 MG capsule Take 2-3 capsules twice a day. Take 4 capsules at bedtime.   hydrOXYzine (VISTARIL) 25 MG capsule Take 1 capsule (25 mg total) by mouth every 8 (eight) hours as needed.   levothyroxine (SYNTHROID) 25 MCG tablet Take 1 tablet (25 mcg total) by mouth daily. Take 30 minutes before any food, without any other medication.   lisinopril (ZESTRIL) 20 MG tablet TAKE 1 TABLET EVERY DAY   omeprazole (PRILOSEC) 40 MG capsule Take 1 capsule (40 mg total) by mouth daily before breakfast.   amitriptyline (ELAVIL) 50 MG tablet Take 1 tablet (50 mg total) by mouth at bedtime. (Patient not taking: Reported on 11/27/2023)   doxycycline (VIBRA-TABS) 100 MG tablet Take 1 tablet (100 mg total) by mouth 2 (two) times daily. (Patient not taking: Reported on 11/27/2023)   ezetimibe (ZETIA) 10 MG tablet Take 1 tablet (10 mg total) by  mouth daily.   fluconazole (DIFLUCAN) 150 MG tablet Take 1 tablet (150 mg total) by mouth once a week. (Patient not taking: Reported on 11/27/2023)   furosemide (LASIX) 20 MG tablet Take 1 tablet (20 mg total) by mouth daily.   isoniazid (NYDRAZID) 300 MG tablet Take 300 mg by mouth daily. (Patient not taking: Reported on 11/27/2023)   No facility-administered encounter medications on file as of 11/27/2023.    Allergies (verified) Codeine phosphate, Dilaudid  [hydromorphone], and Pravastatin sodium   History: Past Medical History:  Diagnosis Date   AAA (abdominal aortic aneurysm) (HCC)    ADD (attention deficit disorder)    Atrial fibrillation (HCC)    Bipolar affective disorder (HCC)    Cigarette smoker motivated to quit 04/15/2020   COPD (chronic obstructive pulmonary disease) (HCC)    Hyperlipidemia    Hypertension    Lichenification 10/27/2022   Obesity (BMI 30-39.9) 04/15/2020   Prediabetes    Hgb A1c 6.4% August 2021   Schizophrenia [facility])    Statin declined 04/16/2020   States he will not take a statin. Has taken them in past.    Statin declined 04/16/2020   States he will not take a statin. Has taken them in past.      Tobacco abuse    TOBACCO USE 07/14/2008   Qualifier: Diagnosis of   By: Cato Mulligan MD, Bruce       Past Surgical History:  Procedure Laterality Date   ABDOMINAL AORTIC ANEURYSM REPAIR     ABDOMINAL AORTIC ENDOVASCULAR STENT GRAFT N/A 05/18/2014   Procedure: ABDOMINAL AORTIC ENDOVASCULAR STENT GRAFT;  Surgeon: Fransisco Hertz, MD;  Location: [facility] OR;  Service: Vascular;  Laterality: N/A;   cataract Bilateral    08/2022 and 09/2022   KNEE SURGERY     bilateral   Family History  Problem Relation Age of Onset   Hypertension Brother    Hyperlipidemia Brother    Hypertension Father    Hyperlipidemia Father    Cancer Father    Social History   Socioeconomic History   Marital status: Divorced    Spouse name: Not on file   Number of children: 1   Years of education: Not on file   Highest education level: Not on file  Occupational History   Not on file  Tobacco Use   Smoking status: Every Day    Current packs/day: 0.50    Average packs/day: 0.5 packs/day for 50.0 years (25.0 ttl pk-yrs)    Types: Cigarettes    Passive exposure: Current   Smokeless tobacco: Never   Tobacco comments:    currently smoking 1ppd as of 10/07/20  Vaping Use   Vaping status: Never Used  Substance and Sexual Activity   Alcohol use:  Yes    Alcohol/week: 6.0 standard drinks of alcohol    Types: 6 Shots of liquor per week    Comment: 6 shots of liquor a week   Drug use: No   Sexual activity: Not Currently  Other Topics Concern   Not on file  Social History Narrative   Not on file   Social Drivers of Health   Financial Resource Strain: Low Risk  (11/27/2023)   Overall Financial Resource Strain (CARDIA)    Difficulty of Paying Living Expenses: Not hard at all  Food Insecurity: No Food Insecurity (11/27/2023)   Hunger Vital Sign    Worried About Running Out of Food in the Last Year: Never true    Ran Out of Food in the  Last Year: Never true  Transportation Needs: No Transportation Needs (11/27/2023)   PRAPARE - Administrator, Civil Service (Medical): No    Lack of Transportation (Non-Medical): No  Physical Activity: Inactive (11/27/2023)   Exercise Vital Sign    Days of Exercise per Week: 0 days    Minutes of Exercise per Session: 0 min  Stress: Stress Concern Present (11/27/2023)   [employer] of Occupational Health - Occupational Stress Questionnaire    Feeling of Stress : To some extent  Social Connections: Socially Isolated (11/27/2023)   Social Connection and Isolation Panel [NHANES]    Frequency of Communication with Friends and Family: Never    Frequency of Social Gatherings with Friends and Family: Never    Attends Religious Services: Never    Database administrator or Organizations: No    Attends Engineer, structural: Never    Marital Status: Divorced    Tobacco Counseling Ready to quit: Not Answered Counseling given: Not Answered Tobacco comments: currently smoking 1ppd as of 10/07/20    Clinical Intake:  Pre-visit preparation completed: Yes  Pain : 0-10 Pain Score: 10-Worst pain ever Pain Type: Acute pain Pain Location: Foot Pain Orientation: Right Pain Onset: In the past 7 days Pain Frequency: Intermittent     Nutritional Risks: None Diabetes: No  Lab  Results  Component Value Date   HGBA1C 5.8 (H) 05/14/2023   HGBA1C 5.9 (H) 12/05/2022   HGBA1C 6.0 (H) 07/25/2022     How often do you need to have someone help you when you read instructions, pamphlets, or other written materials from your doctor or pharmacy?: 1 - Never  Interpreter Needed?: No  Information entered by :: NAllen LPN   Activities of Daily Living     11/27/2023   10:08 AM  In your present state of health, do you have any difficulty performing the following activities:  Hearing? 1  Comment no hearing aids  Vision? 1  Comment macular degeneration left eye  Difficulty concentrating or making decisions? 1  Walking or climbing stairs? 1  Comment pain in foot  Dressing or bathing? 0  Doing errands, shopping? 0  Preparing Food and eating ? N  Using the Toilet? N  In the past six months, have you accidently leaked urine? N  Do you have problems with loss of bowel control? Y  Comment one time  Managing your Medications? N  Managing your Finances? N  Housekeeping or managing your Housekeeping? N    Patient Care Team: Early, Sung Amabile, NP as PCP - General (Nurse Practitioner) Josephine Igo, DO as Consulting Physician (Pulmonary Disease) Doy Hutching, PA (Physician Assistant)  Indicate any recent Medical Services you may have received from other than [facility] providers in the past year (date may be approximate).     Assessment:   This is a routine wellness examination for Jair.  Hearing/Vision screen Hearing Screening - Comments:: Trouble hearing but no hearing aids Vision Screening - Comments:: No regular eye exams   Goals Addressed             This Visit's Progress    Patient Stated       11/27/2023, wants to stop itching       Depression Screen     11/27/2023   10:25 AM 11/21/2022   11:07 AM 07/25/2022    8:41 AM 11/18/2021    3:24 PM 11/15/2021   10:18 AM 02/03/2021    3:56 PM 04/15/2020  9:56 AM  PHQ 2/9 Scores  PHQ - 2 Score 6 6 0 2  2 3  0  PHQ- 9 Score 14 9  8 5 10      Fall Risk     11/27/2023   10:22 AM 07/17/2023   11:55 AM 11/20/2022    8:36 PM 07/25/2022    8:41 AM 11/15/2021   10:17 AM  Fall Risk   Falls in the past year? 1 1 1  0 1  Comment gets dizzy      Number falls in past yr: 1 1 1  0 0  Injury with Fall? 1 1 1  0 1  Comment hurt knee, black eye hit head   Hurt right knee  Risk for fall due to : Impaired mobility;Impaired balance/gait;Impaired vision;History of fall(s);Medication side effect Impaired balance/gait Medication side effect;History of fall(s);Impaired balance/gait;Impaired mobility No Fall Risks No Fall Risks  Follow up Falls prevention discussed;Falls evaluation completed Falls evaluation completed Falls prevention discussed;Education provided;Falls evaluation completed Falls evaluation completed Education provided;Falls evaluation completed    MEDICARE RISK AT HOME:  Medicare Risk at Home Any stairs in or around the home?: Yes If so, are there any without handrails?: No Home free of loose throw rugs in walkways, pet beds, electrical cords, etc?: Yes Adequate lighting in your home to reduce risk of falls?: Yes Life alert?: No Use of a cane, walker or w/c?: Yes Grab bars in the bathroom?: No Shower chair or bench in shower?: No Elevated toilet seat or a handicapped toilet?: Yes  TIMED UP AND GO:  Was the test performed?  No  Cognitive Function: 6CIT completed        11/27/2023   10:27 AM 11/21/2022   11:10 AM  6CIT Screen  What Year? 0 points 0 points  What month? 0 points 0 points  What time? 0 points 0 points  Count back from 20 0 points 0 points  Months in reverse 0 points 0 points  Repeat phrase 0 points 2 points  Total Score 0 points 2 points    Immunizations Immunization History  Administered Date(s) Administered   Influenza Whole 06/07/2010   Influenza,inj,Quad PF,6+ Mos 06/22/2014, 05/29/2016, 06/08/2017, 09/13/2018   Influenza-Unspecified 09/16/2019    PFIZER(Purple Top)SARS-COV-2 Vaccination 11/08/2019, 08/26/2020   Pneumococcal Conjugate-13 06/08/2017   Zoster Recombinant(Shingrix) 12/28/2022, 07/01/2023    Screening Tests Health Maintenance  Topic Date Due   COVID-19 Vaccine (3 - 2024-25 season) 05/06/2023   Lung Cancer Screening  10/06/2023   INFLUENZA VACCINE  12/03/2023 (Originally 04/05/2023)   Pneumonia Vaccine 50+ Years old (2 of 2 - PPSV23 or PCV20) 01/05/2024 (Originally 08/03/2017)   Medicare Annual Wellness (AWV)  11/26/2024   Colonoscopy  10/25/2026   Hepatitis C Screening  Completed   Zoster Vaccines- Shingrix  Completed   HPV VACCINES  Aged Out   DTaP/Tdap/Td  Discontinued    Health Maintenance  Health Maintenance Due  Topic Date Due   COVID-19 Vaccine (3 - 2024-25 season) 05/06/2023   Lung Cancer Screening  10/06/2023   Health Maintenance Items Addressed: Declines covid vaccine  Additional Screening:  Vision Screening: Recommended annual ophthalmology exams for early detection of glaucoma and other disorders of the eye.  Dental Screening: Recommended annual dental exams for proper oral hygiene  Community Resource Referral / Chronic Care Management: CRR required this visit?  No   CCM required this visit?  No     Plan:     I have personally reviewed and noted the following in the patient's  chart:   Medical and social history Use of alcohol, tobacco or illicit drugs  Current medications and supplements including opioid prescriptions. Patient is not currently taking opioid prescriptions. Functional ability and status Nutritional status Physical activity Advanced directives List of other physicians Hospitalizations, surgeries, and ER visits in previous 12 months Vitals Screenings to include cognitive, depression, and falls Referrals and appointments  In addition, I have reviewed and discussed with patient certain preventive protocols, quality metrics, and best practice recommendations. A  written personalized care plan for preventive services as well as general preventive health recommendations were provided to patient.     Barb Merino, LPN   1/61/0960   After Visit Summary: (MyChart) Due to this being a telephonic visit, the after visit summary with patients personalized plan was offered to patient via MyChart   Notes: Nothing significant to report at this time.

## 2023-11-27 NOTE — Patient Instructions (Signed)
 Mr. Steven Hayden , Thank you for taking time to come for your Medicare Wellness Visit. I appreciate your ongoing commitment to your health goals. Please review the following plan we discussed and let me know if I can assist you in the future.   Referrals/Orders/Follow-Ups/Clinician Recommendations: none  This is a list of the screening recommended for you and due dates:  Health Maintenance  Topic Date Due   COVID-19 Vaccine (3 - 2024-25 season) 05/06/2023   Screening for Lung Cancer  10/06/2023   Flu Shot  12/03/2023*   Pneumonia Vaccine (2 of 2 - PPSV23 or PCV20) 01/05/2024*   Medicare Annual Wellness Visit  11/26/2024   Colon Cancer Screening  10/25/2026   Hepatitis C Screening  Completed   Zoster (Shingles) Vaccine  Completed   HPV Vaccine  Aged Out   DTaP/Tdap/Td vaccine  Discontinued  *Topic was postponed. The date shown is not the original due date.    Advanced directives: (Copy Requested) Please bring a copy of your health care power of attorney and living will to the office to be added to your chart at your convenience. You can mail to [facility] [address] [place], [place] [address] or email to [email or web address]  Next Medicare Annual Wellness Visit scheduled for next year: Yes  insert Preventive Care attachment Insert FALL PREVENTION attachment if needed

## 2023-12-06 DIAGNOSIS — L2089 Other atopic dermatitis: Secondary | ICD-10-CM | POA: Diagnosis not present

## 2023-12-10 ENCOUNTER — Encounter: Payer: Self-pay | Admitting: Nurse Practitioner

## 2023-12-20 DIAGNOSIS — L2089 Other atopic dermatitis: Secondary | ICD-10-CM | POA: Diagnosis not present

## 2024-01-03 DIAGNOSIS — L2089 Other atopic dermatitis: Secondary | ICD-10-CM | POA: Diagnosis not present

## 2024-01-08 ENCOUNTER — Telehealth: Payer: Self-pay | Admitting: Nurse Practitioner

## 2024-01-08 DIAGNOSIS — L508 Other urticaria: Secondary | ICD-10-CM

## 2024-01-08 NOTE — Telephone Encounter (Signed)
 Pt needs refills on Amitriptyline  & Gabapentin  to [facility], said those do help the itching some

## 2024-01-08 NOTE — Telephone Encounter (Signed)
 PAP Ozempic received, called pt & informed

## 2024-01-09 MED ORDER — GABAPENTIN 300 MG PO CAPS: ORAL_CAPSULE | ORAL | 1 refills | Status: DC

## 2024-01-09 MED ORDER — AMITRIPTYLINE HCL 50 MG PO TABS: 50.0000 mg | ORAL_TABLET | Freq: Every day | ORAL | 1 refills | Status: DC

## 2024-01-17 DIAGNOSIS — L2089 Other atopic dermatitis: Secondary | ICD-10-CM | POA: Diagnosis not present

## 2024-02-09 ENCOUNTER — Other Ambulatory Visit: Payer: Self-pay | Admitting: Medical

## 2024-02-09 DIAGNOSIS — I10 Essential (primary) hypertension: Secondary | ICD-10-CM

## 2024-02-11 NOTE — Telephone Encounter (Signed)
 Due for a visit with Sarabeth. I will refill medication

## 2024-02-11 NOTE — Telephone Encounter (Signed)
 Sent message for pt to schedule physical or med management appointment with Abraham Hoffmann

## 2024-02-12 ENCOUNTER — Other Ambulatory Visit: Payer: Self-pay | Admitting: Nurse Practitioner

## 2024-02-12 DIAGNOSIS — E038 Other specified hypothyroidism: Secondary | ICD-10-CM

## 2024-02-12 NOTE — Telephone Encounter (Signed)
 Has an appt on 7/3

## 2024-03-06 ENCOUNTER — Encounter: Payer: Self-pay | Admitting: Nurse Practitioner

## 2024-03-06 ENCOUNTER — Ambulatory Visit: Admitting: Nurse Practitioner

## 2024-03-06 VITALS — BP 130/80 | HR 94 | Wt 273.0 lb

## 2024-03-06 DIAGNOSIS — L508 Other urticaria: Secondary | ICD-10-CM

## 2024-03-06 DIAGNOSIS — L853 Xerosis cutis: Secondary | ICD-10-CM

## 2024-03-06 DIAGNOSIS — F419 Anxiety disorder, unspecified: Secondary | ICD-10-CM

## 2024-03-06 DIAGNOSIS — M7989 Other specified soft tissue disorders: Secondary | ICD-10-CM

## 2024-03-06 DIAGNOSIS — R7303 Prediabetes: Secondary | ICD-10-CM

## 2024-03-06 DIAGNOSIS — I10 Essential (primary) hypertension: Secondary | ICD-10-CM

## 2024-03-06 DIAGNOSIS — E66812 Obesity, class 2: Secondary | ICD-10-CM | POA: Diagnosis not present

## 2024-03-06 DIAGNOSIS — I480 Paroxysmal atrial fibrillation: Secondary | ICD-10-CM

## 2024-03-06 DIAGNOSIS — I714 Abdominal aortic aneurysm, without rupture, unspecified: Secondary | ICD-10-CM

## 2024-03-06 DIAGNOSIS — I878 Other specified disorders of veins: Secondary | ICD-10-CM | POA: Diagnosis not present

## 2024-03-06 DIAGNOSIS — B354 Tinea corporis: Secondary | ICD-10-CM | POA: Diagnosis not present

## 2024-03-06 DIAGNOSIS — E782 Mixed hyperlipidemia: Secondary | ICD-10-CM | POA: Diagnosis not present

## 2024-03-06 DIAGNOSIS — Z6837 Body mass index (BMI) 37.0-37.9, adult: Secondary | ICD-10-CM | POA: Diagnosis not present

## 2024-03-06 MED ORDER — GABAPENTIN 600 MG PO TABS
1200.0000 mg | ORAL_TABLET | Freq: Every day | ORAL | 3 refills | Status: AC
Start: 2024-03-06 — End: ?

## 2024-03-06 MED ORDER — TERBINAFINE HCL 250 MG PO TABS: 250.0000 mg | ORAL_TABLET | Freq: Every day | ORAL | 1 refills | Status: DC

## 2024-03-06 MED ORDER — AMITRIPTYLINE HCL 50 MG PO TABS: 50.0000 mg | ORAL_TABLET | Freq: Every day | ORAL | 1 refills | Status: DC

## 2024-03-06 NOTE — Patient Instructions (Addendum)
 I want you to get a shampoo from the store called [employer] . It is in the section with the dandruff shampoo. Use this as a body wash every other day. Lather this all over your body and let it set for 5 minutes before you rinse it off to kill the fungal infection.    I changed the gabapentin  to 600mg  capsules so you only have to take 2 capsules at bedtime.  I sent in the amitriptyline  refill.

## 2024-03-06 NOTE — Progress Notes (Signed)
 Steven Doing, DNP, AGNP-c [facility]  [address] [place], [place] [address] [phone]  ESTABLISHED PATIENT- Chronic Health and/or Follow-Up Visit  Blood pressure 130/80, pulse 94, weight 273 lb (123.8 kg), SpO2 97%.    History of Present Illness Steven Hayden is a 73 year old male who presents for medication review and management of chronic itching and leg swelling.  He experiences persistent and severe itching that has not improved despite various treatments. He is currently taking amitriptyline , which helps with itching and sleep, and gabapentin , initially started for itching, but its effectiveness is uncertain. Hydroxyzine  was tried without relief. He previously used Dupixent from February to April, which was ineffective, and is currently on Adbry for a couple of months with no improvement. The lack of relief significantly impacts his quality of life, including sleep disturbances, as he goes to bed around 3 AM and wakes up at 7 or 8 AM, often sleeping in a chair.  He has a history of leg swelling, particularly in one foot, which has persisted for months. Elevating his foot reduces the swelling, but his leg remains swollen. He recalls a previous consultation where a doctor mentioned a valve issue in his legs affecting blood flow. His sister, a Designer, jewellery, mentioned a family history of Berger's disease, which concerns him due to its hereditary nature and his grandfather's history of leg amputations.  He reports circular skin lesions that are itchy and leave the skin discolored. He uses triamcinolone  cream on these areas. He mentions a history of fungal infections and recalls using a small bottle of antifungal shampoo previously.  He experiences difficulty swallowing, choking on food, and recalls a previous referral to a GI specialist for evaluation, which was postponed due to other health priorities. This issue has worsened, affecting his ability to eat  certain foods.  His current medications include amitriptyline , gabapentin  (previously 4 capsules, now adjusted to 600 mg capsules), and omeprazole , which he takes with other medications despite instructions to take it before breakfast.   All ROS negative with exception of what is listed above.   PHYSICAL EXAM Physical Exam Vitals and nursing note reviewed.  Constitutional:      Appearance: Normal appearance. He is obese.  HENT:     Head: Normocephalic.  Eyes:     Pupils: Pupils are equal, round, and reactive to light.  Cardiovascular:     Rate and Rhythm: Normal rate and regular rhythm.     Pulses: Normal pulses.     Heart sounds: Normal heart sounds.  Pulmonary:     Effort: Pulmonary effort is normal.     Breath sounds: Normal breath sounds.  Musculoskeletal:     Cervical back: Normal range of motion.     Right lower leg: Edema present.     Left lower leg: Edema present.  Skin:    General: Skin is warm.     Comments: Scaling present on lower extremities bilaterally. No erythema noted.   Neurological:     General: No focal deficit present.     Mental Status: He is alert and oriented to person, place, and time.  Psychiatric:        Mood and Affect: Mood normal.      PLAN Problem List Items Addressed This Visit     AAA (abdominal aortic aneurysm) without rupture (HCC)   Relevant Medications   metoprolol  succinate (TOPROL -XL) 25 MG 24 hr tablet   Paroxysmal atrial fibrillation (HCC)   Relevant Medications   metoprolol  succinate (  TOPROL -XL) 25 MG 24 hr tablet   Primary hypertension   Relevant Medications   metoprolol  succinate (TOPROL -XL) 25 MG 24 hr tablet   Other Relevant Orders   Hemoglobin A1c (Completed)   CBC with Differential/Platelet (Completed)   Comprehensive metabolic panel with GFR (Completed)   Prediabetes   Relevant Orders   Hemoglobin A1c (Completed)   CBC with Differential/Platelet (Completed)   Comprehensive metabolic panel with GFR (Completed)    Mixed hyperlipidemia   Relevant Medications   metoprolol  succinate (TOPROL -XL) 25 MG 24 hr tablet   Class 2 severe obesity due to excess calories with serious comorbidity and body mass index (BMI) of 37.0 to 37.9 in adult [facility])   Relevant Orders   Hemoglobin A1c (Completed)   CBC with Differential/Platelet (Completed)   Comprehensive metabolic panel with GFR (Completed)   Right leg swelling   Xerosis of skin   Anxiety   Relevant Medications   amitriptyline  (ELAVIL ) 50 MG tablet   Other Visit Diagnoses       Venous stasis    -  Primary   Relevant Orders   Ambulatory referral to Vascular Surgery     Chronic urticaria       Relevant Medications   gabapentin  (NEURONTIN ) 600 MG tablet   amitriptyline  (ELAVIL ) 50 MG tablet   Other Relevant Orders   Ambulatory referral to Physical Medicine Rehab     Tinea of the body       Relevant Medications   terbinafine  (LAMISIL ) 250 MG tablet      Chronic Pruritus Persistent itching significantly affecting quality of life, unresponsive to multiple treatments including Dupixent and Adbry. Dermatological evaluation ongoing. Possible fungal component identified with ultraviolet light examination. Consideration of nerve stimulator for chronic itch management. Discussed terbinafine  as an antifungal treatment working from the inside out, with a plan for three months of therapy. Nizoral  shampoo recommended as a topical adjunct. Emphasized the need for patience with treatments as some may take up to six months to show efficacy. - Prescribe terbinafine  250 mg once daily for three months - Advise use of Nizoral  shampoo as body wash - Refer to physical medicine for potential nerve stimulator  Venous Insufficiency Chronic leg swelling, possibly due to venous insufficiency. Family history of vascular disease noted, including concern for hereditary conditions like Berger's disease. Previous evaluation suggested valve dysfunction in leg veins. Referral to  vascular surgeon for further evaluation and potential intervention discussed. - Refer to vascular surgeon for evaluation of venous insufficiency  Dysphagia Choking on food, previously evaluated by GI specialist with plans for further investigation and potential surgical intervention. Follow-up delayed due to other health issues. Advised to contact [facility] to resume evaluation and management. - Advise to contact  GI to resume evaluation and management  Medication Management Review of current medications for efficacy and necessity. Amitriptyline  and gabapentin  deemed necessary for symptom management. Hydroxyzine  discontinued due to lack of efficacy. Gabapentin  dosage adjusted to 600 mg capsules to reduce pill burden. - Prescribe amitriptyline  - Prescribe gabapentin  600 mg capsules, two at bedtime - Discontinue hydroxyzine   General Health Maintenance Discussion of potential benefits of sun exposure for skin condition. Routine lab work needed to monitor organ function due to medication use. Advised to sit in the sun daily to potentially improve skin condition. - Advise daily sun exposure for potential improvement in skin condition - Order lab work to assess kidney and liver function No follow-ups on file. Time: 48 minutes, >50% spent counseling, care coordination, chart review, and  documentation.   Steven Doing, DNP, AGNP-c

## 2024-03-07 ENCOUNTER — Other Ambulatory Visit: Payer: Self-pay | Admitting: Nurse Practitioner

## 2024-03-07 DIAGNOSIS — L508 Other urticaria: Secondary | ICD-10-CM

## 2024-03-07 LAB — CBC WITH DIFFERENTIAL/PLATELET
Basophils Absolute: 0.1 x10E3/uL (ref 0.0–0.2)
Basos: 0 %
EOS (ABSOLUTE): 0.1 x10E3/uL (ref 0.0–0.4)
Eos: 1 %
Hematocrit: 48.4 % (ref 37.5–51.0)
Hemoglobin: 16.2 g/dL (ref 13.0–17.7)
Immature Grans (Abs): 0 x10E3/uL (ref 0.0–0.1)
Immature Granulocytes: 0 %
Lymphocytes Absolute: 5.4 x10E3/uL — ABNORMAL HIGH (ref 0.7–3.1)
Lymphs: 48 %
MCH: 31.8 pg (ref 26.6–33.0)
MCHC: 33.5 g/dL (ref 31.5–35.7)
MCV: 95 fL (ref 79–97)
Monocytes Absolute: 0.7 x10E3/uL (ref 0.1–0.9)
Monocytes: 7 %
Neutrophils Absolute: 4.8 x10E3/uL (ref 1.4–7.0)
Neutrophils: 44 %
Platelets: 141 x10E3/uL — ABNORMAL LOW (ref 150–450)
RBC: 5.1 x10E6/uL (ref 4.14–5.80)
RDW: 13.6 % (ref 11.6–15.4)
WBC: 11.1 x10E3/uL — ABNORMAL HIGH (ref 3.4–10.8)

## 2024-03-07 LAB — COMPREHENSIVE METABOLIC PANEL WITH GFR
ALT: 21 IU/L (ref 0–44)
AST: 21 IU/L (ref 0–40)
Albumin: 4.2 g/dL (ref 3.8–4.8)
Alkaline Phosphatase: 99 IU/L (ref 44–121)
BUN/Creatinine Ratio: 11 (ref 10–24)
BUN: 11 mg/dL (ref 8–27)
Bilirubin Total: 0.3 mg/dL (ref 0.0–1.2)
CO2: 29 mmol/L (ref 20–29)
Calcium: 8.9 mg/dL (ref 8.6–10.2)
Chloride: 101 mmol/L (ref 96–106)
Creatinine, Ser: 1.04 mg/dL (ref 0.76–1.27)
Globulin, Total: 2.8 g/dL (ref 1.5–4.5)
Glucose: 87 mg/dL (ref 70–99)
Potassium: 4.9 mmol/L (ref 3.5–5.2)
Sodium: 142 mmol/L (ref 134–144)
Total Protein: 7 g/dL (ref 6.0–8.5)
eGFR: 76 mL/min/1.73 (ref >59)

## 2024-03-07 LAB — HEMOGLOBIN A1C
Est. average glucose Bld gHb Est-mCnc: 123 mg/dL
Hgb A1c MFr Bld: 5.9 % — ABNORMAL HIGH (ref 4.8–5.6)

## 2024-03-10 ENCOUNTER — Telehealth: Payer: Self-pay | Admitting: Gastroenterology

## 2024-03-10 NOTE — Telephone Encounter (Signed)
 PT wants to reschedule EGD at the hospital. Requesting a call back to find out available dates. Please advise.

## 2024-03-10 NOTE — Telephone Encounter (Signed)
 Spoke with patient. He is trying to get scheduled for testing that was ordered by ENT. Dr. Soldatova. Gave patient office number for provider and he stated he will call their office to schedule.

## 2024-03-11 ENCOUNTER — Encounter: Payer: Self-pay | Admitting: Nurse Practitioner

## 2024-03-26 ENCOUNTER — Ambulatory Visit: Payer: Self-pay | Admitting: Nurse Practitioner

## 2024-03-26 DIAGNOSIS — L853 Xerosis cutis: Secondary | ICD-10-CM

## 2024-03-26 DIAGNOSIS — D72820 Lymphocytosis (symptomatic): Secondary | ICD-10-CM

## 2024-03-26 DIAGNOSIS — D696 Thrombocytopenia, unspecified: Secondary | ICD-10-CM

## 2024-04-03 ENCOUNTER — Other Ambulatory Visit

## 2024-04-18 ENCOUNTER — Emergency Department (HOSPITAL_COMMUNITY)

## 2024-04-18 ENCOUNTER — Encounter (HOSPITAL_COMMUNITY): Payer: Self-pay

## 2024-04-18 ENCOUNTER — Other Ambulatory Visit: Payer: Self-pay

## 2024-04-18 ENCOUNTER — Emergency Department (HOSPITAL_COMMUNITY)
Admission: EM | Admit: 2024-04-18 | Discharge: 2024-04-18 | Disposition: A | Discharge status: Home / Self Care | Attending: Emergency Medicine | Admitting: Emergency Medicine

## 2024-04-18 DIAGNOSIS — Z87891 Personal history of nicotine dependence: Secondary | ICD-10-CM | POA: Diagnosis not present

## 2024-04-18 DIAGNOSIS — Z7901 Long term (current) use of anticoagulants: Secondary | ICD-10-CM | POA: Diagnosis not present

## 2024-04-18 DIAGNOSIS — Z79899 Other long term (current) drug therapy: Secondary | ICD-10-CM | POA: Insufficient documentation

## 2024-04-18 DIAGNOSIS — I10 Essential (primary) hypertension: Secondary | ICD-10-CM | POA: Insufficient documentation

## 2024-04-18 DIAGNOSIS — J449 Chronic obstructive pulmonary disease, unspecified: Secondary | ICD-10-CM | POA: Insufficient documentation

## 2024-04-18 DIAGNOSIS — R404 Transient alteration of awareness: Secondary | ICD-10-CM | POA: Diagnosis not present

## 2024-04-18 DIAGNOSIS — T675XXA Heat exhaustion, unspecified, initial encounter: Secondary | ICD-10-CM | POA: Diagnosis not present

## 2024-04-18 DIAGNOSIS — R519 Headache, unspecified: Secondary | ICD-10-CM | POA: Diagnosis not present

## 2024-04-18 DIAGNOSIS — T679XXA Effect of heat and light, unspecified, initial encounter: Secondary | ICD-10-CM | POA: Insufficient documentation

## 2024-04-18 DIAGNOSIS — R4182 Altered mental status, unspecified: Secondary | ICD-10-CM | POA: Diagnosis not present

## 2024-04-18 DIAGNOSIS — I499 Cardiac arrhythmia, unspecified: Secondary | ICD-10-CM | POA: Diagnosis not present

## 2024-04-18 LAB — I-STAT VENOUS BLOOD GAS, ED
Acid-Base Excess: 6 mmol/L — ABNORMAL HIGH (ref 0.0–2.0)
Bicarbonate: 30.5 mmol/L — ABNORMAL HIGH (ref 20.0–28.0)
Calcium, Ion: 1.05 mmol/L — ABNORMAL LOW (ref 1.15–1.40)
HCT: 46 % (ref 39.0–52.0)
Hemoglobin: 15.6 g/dL (ref 13.0–17.0)
O2 Saturation: 100 %
Potassium: 3.8 mmol/L (ref 3.5–5.1)
Sodium: 140 mmol/L (ref 135–145)
TCO2: 32 mmol/L (ref 22–32)
pCO2, Ven: 43.1 mmHg — ABNORMAL LOW (ref 44–60)
pH, Ven: 7.458 — ABNORMAL HIGH (ref 7.25–7.43)
pO2, Ven: 160 mmHg — ABNORMAL HIGH (ref 32–45)

## 2024-04-18 LAB — COMPREHENSIVE METABOLIC PANEL WITH GFR
ALT: 18 U/L (ref 0–44)
AST: 19 U/L (ref 15–41)
Albumin: 3.2 g/dL — ABNORMAL LOW (ref 3.5–5.0)
Alkaline Phosphatase: 57 U/L (ref 38–126)
Anion gap: 11 (ref 5–15)
BUN: 11 mg/dL (ref 8–23)
CO2: 26 mmol/L (ref 22–32)
Calcium: 8.5 mg/dL — ABNORMAL LOW (ref 8.9–10.3)
Chloride: 104 mmol/L (ref 98–111)
Creatinine, Ser: 1.11 mg/dL (ref 0.61–1.24)
GFR, Estimated: >60 mL/min (ref >60)
Glucose, Bld: 122 mg/dL — ABNORMAL HIGH (ref 70–99)
Potassium: 3.9 mmol/L (ref 3.5–5.1)
Sodium: 141 mmol/L (ref 135–145)
Total Bilirubin: 0.6 mg/dL (ref 0.0–1.2)
Total Protein: 6.3 g/dL — ABNORMAL LOW (ref 6.5–8.1)

## 2024-04-18 LAB — CBC WITH DIFFERENTIAL/PLATELET
Abs Immature Granulocytes: 0.04 K/uL (ref 0.00–0.07)
Basophils Absolute: 0 K/uL (ref 0.0–0.1)
Basophils Relative: 0 %
Eosinophils Absolute: 0.1 K/uL (ref 0.0–0.5)
Eosinophils Relative: 1 %
HCT: 46.2 % (ref 39.0–52.0)
Hemoglobin: 15.2 g/dL (ref 13.0–17.0)
Immature Granulocytes: 1 %
Lymphocytes Relative: 30 %
Lymphs Abs: 2.6 K/uL (ref 0.7–4.0)
MCH: 31.7 pg (ref 26.0–34.0)
MCHC: 32.9 g/dL (ref 30.0–36.0)
MCV: 96.3 fL (ref 80.0–100.0)
Monocytes Absolute: 0.5 K/uL (ref 0.1–1.0)
Monocytes Relative: 6 %
Neutro Abs: 5.6 K/uL (ref 1.7–7.7)
Neutrophils Relative %: 62 %
Platelets: 169 K/uL (ref 150–400)
RBC: 4.8 MIL/uL (ref 4.22–5.81)
RDW: 14 % (ref 11.5–15.5)
WBC: 8.8 K/uL (ref 4.0–10.5)
nRBC: 0 % (ref 0.0–0.2)

## 2024-04-18 LAB — RAPID URINE DRUG SCREEN, HOSP PERFORMED
Amphetamines: NOT DETECTED
Barbiturates: NOT DETECTED
Benzodiazepines: NOT DETECTED
Cocaine: POSITIVE — AB
Opiates: NOT DETECTED
Tetrahydrocannabinol: NOT DETECTED

## 2024-04-18 LAB — URINALYSIS, ROUTINE W REFLEX MICROSCOPIC
Bacteria, UA: NONE SEEN
Bilirubin Urine: NEGATIVE
Glucose, UA: NEGATIVE mg/dL
Hgb urine dipstick: NEGATIVE
Ketones, ur: NEGATIVE mg/dL
Leukocytes,Ua: NEGATIVE
Nitrite: NEGATIVE
Protein, ur: 30 mg/dL — AB
Specific Gravity, Urine: 1.019 (ref 1.005–1.030)
pH: 6 (ref 5.0–8.0)

## 2024-04-18 LAB — ETHANOL: Alcohol, Ethyl (B): <15 mg/dL (ref <15)

## 2024-04-18 LAB — CBG MONITORING, ED: Glucose-Capillary: 127 mg/dL — ABNORMAL HIGH (ref 70–99)

## 2024-04-18 LAB — TROPONIN I (HIGH SENSITIVITY)
Troponin I (High Sensitivity): 9 ng/L (ref <18)
Troponin I (High Sensitivity): 15 ng/L (ref <18)

## 2024-04-18 LAB — CK: Total CK: 64 U/L (ref 49–397)
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
## 2024-04-18 NOTE — ED Notes (Signed)
 CCMD called.

## 2024-04-18 NOTE — ED Notes (Signed)
 Attempted to get a urine sample from the pt. Pt stood up on the side of the bed with urinal, had no luck. EDP made aware

## 2024-04-18 NOTE — ED Notes (Signed)
 Pt to CT

## 2024-04-24 NOTE — Telephone Encounter (Signed)
 PAP Ozempic received, left pt message

## 2024-04-26 ENCOUNTER — Other Ambulatory Visit: Payer: Self-pay | Admitting: Nurse Practitioner

## 2024-04-26 DIAGNOSIS — E038 Other specified hypothyroidism: Secondary | ICD-10-CM

## 2024-04-26 DIAGNOSIS — I10 Essential (primary) hypertension: Secondary | ICD-10-CM

## 2024-04-28 ENCOUNTER — Other Ambulatory Visit: Payer: Self-pay | Admitting: General Practice

## 2024-04-28 MED ORDER — FUROSEMIDE 20 MG PO TABS: 20.0000 mg | ORAL_TABLET | Freq: Every day | ORAL | 0 refills | Status: DC

## 2024-04-28 MED ORDER — EZETIMIBE 10 MG PO TABS: 10.0000 mg | ORAL_TABLET | Freq: Every day | ORAL | 0 refills | Status: DC

## 2024-05-07 ENCOUNTER — Encounter: Payer: Self-pay | Admitting: Nurse Practitioner

## 2024-05-07 ENCOUNTER — Ambulatory Visit (INDEPENDENT_AMBULATORY_CARE_PROVIDER_SITE_OTHER): Admitting: Nurse Practitioner

## 2024-05-07 VITALS — BP 130/78 | HR 71 | Wt 274.6 lb

## 2024-05-07 DIAGNOSIS — H353221 Exudative age-related macular degeneration, left eye, with active choroidal neovascularization: Secondary | ICD-10-CM

## 2024-05-07 DIAGNOSIS — L299 Pruritus, unspecified: Secondary | ICD-10-CM | POA: Diagnosis not present

## 2024-05-07 DIAGNOSIS — I480 Paroxysmal atrial fibrillation: Secondary | ICD-10-CM | POA: Diagnosis not present

## 2024-05-07 DIAGNOSIS — L400 Psoriasis vulgaris: Secondary | ICD-10-CM

## 2024-05-07 DIAGNOSIS — F209 Schizophrenia, unspecified: Secondary | ICD-10-CM

## 2024-05-07 DIAGNOSIS — I714 Abdominal aortic aneurysm, without rupture, unspecified: Secondary | ICD-10-CM

## 2024-05-07 DIAGNOSIS — R42 Dizziness and giddiness: Secondary | ICD-10-CM

## 2024-05-07 DIAGNOSIS — J432 Centrilobular emphysema: Secondary | ICD-10-CM

## 2024-05-07 DIAGNOSIS — I10 Essential (primary) hypertension: Secondary | ICD-10-CM | POA: Diagnosis not present

## 2024-05-07 DIAGNOSIS — L853 Xerosis cutis: Secondary | ICD-10-CM | POA: Diagnosis not present

## 2024-05-07 DIAGNOSIS — M7989 Other specified soft tissue disorders: Secondary | ICD-10-CM

## 2024-05-07 DIAGNOSIS — F5101 Primary insomnia: Secondary | ICD-10-CM | POA: Insufficient documentation

## 2024-05-07 DIAGNOSIS — E291 Testicular hypofunction: Secondary | ICD-10-CM | POA: Insufficient documentation

## 2024-05-07 DIAGNOSIS — Z720 Tobacco use: Secondary | ICD-10-CM | POA: Insufficient documentation

## 2024-05-07 DIAGNOSIS — E66812 Obesity, class 2: Secondary | ICD-10-CM

## 2024-05-07 DIAGNOSIS — F3342 Major depressive disorder, recurrent, in full remission: Secondary | ICD-10-CM

## 2024-05-07 DIAGNOSIS — Z87828 Personal history of other (healed) physical injury and trauma: Secondary | ICD-10-CM | POA: Insufficient documentation

## 2024-05-07 LAB — COMPREHENSIVE METABOLIC PANEL WITH GFR
ALT: 21 IU/L (ref 0–44)
AST: 20 IU/L (ref 0–40)
Albumin: 4.2 g/dL (ref 3.8–4.8)
Alkaline Phosphatase: 88 IU/L (ref 44–121)
BUN/Creatinine Ratio: 11 (ref 10–24)
BUN: 11 mg/dL (ref 8–27)
Bilirubin Total: 0.4 mg/dL (ref 0.0–1.2)
CO2: 26 mmol/L (ref 20–29)
Calcium: 8.9 mg/dL (ref 8.6–10.2)
Chloride: 99 mmol/L (ref 96–106)
Creatinine, Ser: 1.03 mg/dL (ref 0.76–1.27)
Globulin, Total: 2.9 g/dL (ref 1.5–4.5)
Glucose: 116 mg/dL — ABNORMAL HIGH (ref 70–99)
Potassium: 4.2 mmol/L (ref 3.5–5.2)
Sodium: 143 mmol/L (ref 134–144)
Total Protein: 7.1 g/dL (ref 6.0–8.5)
eGFR: 77 mL/min/1.73 (ref >59)

## 2024-05-07 LAB — CBC WITH DIFFERENTIAL/PLATELET
Basophils Absolute: 0 x10E3/uL (ref 0.0–0.2)
Basos: 0 %
EOS (ABSOLUTE): 0.1 x10E3/uL (ref 0.0–0.4)
Eos: 1 %
Hematocrit: 50.6 % (ref 37.5–51.0)
Hemoglobin: 16.7 g/dL (ref 13.0–17.7)
Immature Grans (Abs): 0 x10E3/uL (ref 0.0–0.1)
Immature Granulocytes: 0 %
Lymphocytes Absolute: 6 x10E3/uL — ABNORMAL HIGH (ref 0.7–3.1)
Lymphs: 55 %
MCH: 32.1 pg (ref 26.6–33.0)
MCHC: 33 g/dL (ref 31.5–35.7)
MCV: 97 fL (ref 79–97)
Monocytes Absolute: 0.6 x10E3/uL (ref 0.1–0.9)
Monocytes: 6 %
Neutrophils Absolute: 4.1 x10E3/uL (ref 1.4–7.0)
Neutrophils: 38 %
Platelets: 141 x10E3/uL — ABNORMAL LOW (ref 150–450)
RBC: 5.21 x10E6/uL (ref 4.14–5.80)
RDW: 14 % (ref 11.6–15.4)
WBC: 10.9 x10E3/uL — ABNORMAL HIGH (ref 3.4–10.8)

## 2024-05-07 LAB — HEMOGLOBIN A1C
Est. average glucose Bld gHb Est-mCnc: 120 mg/dL
Hgb A1c MFr Bld: 5.8 % — ABNORMAL HIGH (ref 4.8–5.6)

## 2024-05-07 MED ORDER — APIXABAN 5 MG PO TABS: 5.0000 mg | ORAL_TABLET | Freq: Two times a day (BID) | ORAL | 3 refills | Status: AC

## 2024-05-07 MED ORDER — TRIAMCINOLONE ACETONIDE 0.1 % EX OINT: TOPICAL_OINTMENT | CUTANEOUS | 1 refills | Status: AC

## 2024-05-07 NOTE — Assessment & Plan Note (Signed)
 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 He has had significant improvement in symptoms since starting, but still has areas of skin thickening and fungal growth appearance on feet and toes. Itching is much better with terbinafine  treatment. Continues to experience some attacks but less frequent and severe.  - Continue terbinafine  treatment for now.
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
## 2024-05-07 NOTE — Assessment & Plan Note (Signed)
>>  ASSESSMENT AND PLAN FOR AAA (ABDOMINAL AORTIC ANEURYSM) WITHOUT RUPTURE (HCC) WRITTEN ON 05/07/2024  1:58 PM BY Harleyquinn Gasser E, NP  Previous repair with no alarm symptoms.

## 2024-05-07 NOTE — Assessment & Plan Note (Signed)
 Not currently on medications or managed with psychiatry. At this time mental health appears stable with no alarm symptoms. Recommend ongoing monitoring and management with psychiatry to help address any concerns as they may present.

## 2024-05-07 NOTE — Assessment & Plan Note (Signed)
 HRRR today on evaluation. Will refill apixaban  for BID dosing and recommend he continue with this treatment.

## 2024-05-07 NOTE — Assessment & Plan Note (Signed)
 Chronic. On 2L [place] at baseline. No concerning symptoms present today. We discussed his supply and this appears to be adequate. Recommend keeping a full tank with him when traveling.

## 2024-05-07 NOTE — Assessment & Plan Note (Signed)
 Mood appears stable with no alarm symptoms present. Currently on elavil  only. No changes at this time.
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
## 2024-05-07 NOTE — Assessment & Plan Note (Signed)
 Newly identified with thick, rough, scaly skin lesions on the legs and buttocks, possibly extending up the spine. Symptoms may be exacerbated by pressure or friction from sitting. - Prescribe triamcinolone  ointment to apply twice daily for 7 days, then take a break for a week, and repeat

## 2024-05-07 NOTE — Assessment & Plan Note (Addendum)
 Previous repair with no alarm symptoms.

## 2024-05-07 NOTE — Assessment & Plan Note (Signed)
Chronic in the setting of multiple conditions. Recommend diet and exercise management for weight control and reduction of risks.

## 2024-05-07 NOTE — Assessment & Plan Note (Signed)
 Recommend annual follow-up with ophthalmology for monitoring.

## 2024-05-07 NOTE — Assessment & Plan Note (Signed)
 Chronic venous insufficiency present. Recommend elevated the legs while seated and wearing compression stockings to help with venous flow. No alarm symptoms at this time.

## 2024-05-07 NOTE — Assessment & Plan Note (Signed)
 Recurrent dizziness, particularly when bending over. Referral to cardiology pending from emergency room visit. No alarm symptoms present at this time. BP is stable and slightly elevated.  - Follow up on cardiology referral

## 2024-05-14 ENCOUNTER — Ambulatory Visit: Payer: Self-pay | Admitting: Nurse Practitioner

## 2024-05-23 DIAGNOSIS — L299 Pruritus, unspecified: Secondary | ICD-10-CM | POA: Diagnosis not present

## 2024-05-23 DIAGNOSIS — L853 Xerosis cutis: Secondary | ICD-10-CM | POA: Diagnosis not present

## 2024-05-23 DIAGNOSIS — L281 Prurigo nodularis: Secondary | ICD-10-CM | POA: Diagnosis not present

## 2024-06-11 ENCOUNTER — Encounter: Payer: Self-pay | Admitting: Nurse Practitioner

## 2024-06-16 NOTE — Telephone Encounter (Signed)
 Unfortunately, this condition appears to be quite complex and is well beyond my knowledge base. I have tried everything that I can think of to help manage this and am out of options from the primary care standpoint.   I strongly recommend that he reach out to dermatology and let them know that his condition has, once again, escalated and he is no longer getting relief from any of the medications he is taking. They are the experts in the area and it sounds like they are trying to find a solution by getting approval for the new medication. This can be a lengthy process when waiting on insurance approval.   I strongly recommend that he not drink excessive amounts of alcohol . This can cause further complications and interact with many of his medications. I am concerned that this will only lead to further complications.  He will need to stop the terbinafine  immediately. This has serious interaction with the alcohol  and can lead to permanent damage to his liver.   I would also like him to start to taper off of the gabapentin  since this is not helping. He can cut down to 600mg  (1 capsule) at bedtime.

## 2024-06-23 ENCOUNTER — Telehealth: Payer: Self-pay | Admitting: Gastroenterology

## 2024-06-23 NOTE — Telephone Encounter (Signed)
 Spoke with pt. He reports he is trying to get rescheduled for an x-ray of his neck. He is unsure of who ordered the testing, but reports they weren't happy with it so they ordered another one and I never got that done. 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 When I mentioned Dr. Myrlene name to the pt, he verbalized that was the provider he was referring to. I gave the pt their office number and he will call their office to reschedule his appointment.

## 2024-06-23 NOTE — Telephone Encounter (Signed)
 Inbound call from patient requesting a call to discuss reschedule 06/28/23 procedure at [facility]. States he having complications with his esophagus and requesting to know if he is able to reschedule this procedure. Please advise, thank you

## 2024-06-24 ENCOUNTER — Telehealth (INDEPENDENT_AMBULATORY_CARE_PROVIDER_SITE_OTHER): Payer: Self-pay | Admitting: Otolaryngology

## 2024-06-24 NOTE — Telephone Encounter (Signed)
 The patient called in and left a VM that he is ready to re-start the surgery process that he had previously done with Dr Okey. I returned his call and left a voicemail stating that Dr Okey is no longer in our office and we would be glad to get him scheduled to re-start that process with one of our providers.

## 2024-07-03 ENCOUNTER — Ambulatory Visit (INDEPENDENT_AMBULATORY_CARE_PROVIDER_SITE_OTHER)

## 2024-07-03 ENCOUNTER — Encounter (INDEPENDENT_AMBULATORY_CARE_PROVIDER_SITE_OTHER): Payer: Self-pay

## 2024-07-03 VITALS — BP 167/99 | HR 62

## 2024-07-03 DIAGNOSIS — K225 Diverticulum of esophagus, acquired: Secondary | ICD-10-CM

## 2024-07-03 DIAGNOSIS — R1319 Other dysphagia: Secondary | ICD-10-CM

## 2024-07-06 NOTE — Progress Notes (Signed)
 Dear Dr. Oris, Here is my assessment for our mutual patient, Steven Hayden. Thank you for allowing me the opportunity to care for your patient. Please do not hesitate to contact me should you have any other questions. Sincerely, Dr. Hadassah Parody  Otolaryngology Clinic Note Referring provider: Dr. Oris HPI:   Initial HPI (07/03/2024)  Prior pt of Dr. Okey  Discussed the use of AI scribe software for clinical note transcription with the patient, who gave verbal consent to proceed.  History of Present Illness Steven Hayden is a 73 year old male with a history of COPD, Afib on Eliquis , schizophrenia who presents for follow-up regarding a zenkers diverticulum diagnosed last year. He presents today as a new patient to me for f/u on this and with difficulty swallowing. He was offered repair last year but was not interested. He would now like to proceed.   Scope exam performed in clinic by Dr. Soldatova at last visit which was normal other than post-cricoid edema.   Dysphagia - Significant difficulty swallowing with food becoming lodged in the esophagus - Choking episodes associated with swallowing - Able to tolerate only soft foods such as applesauce - Requires water to assist with swallowing  Prior diagnostic evaluation - Esophagram performed 1 year ago  Has multiple co-morbidities including:  Hx of AAA being observed with serial imaging  Afib on Eliquis  CAD/HTN BMI > 37.84 COPD with O2 requirement  Independent Review of Additional Tests or Records:  Esophagram 06/06/2023 independently reviewed and shows posterior zenker's diverticulum   PMH/Meds/All/SocHx/FamHx/ROS:   Past Medical History:  Diagnosis Date   AAA (abdominal aortic aneurysm)    Abnormal findings on diagnostic imaging of lung 02/23/2020   ADD (attention deficit disorder)    Atrial fibrillation (HCC)    Attention deficit disorder 05/13/2007   Qualifier: Diagnosis of   By: Krystal, RN, Leeroy          Bipolar affective disorder [facility])    Cigarette smoker motivated to quit 04/15/2020   COPD (chronic obstructive pulmonary disease) (HCC)    Globus sensation 12/05/2022   Hyperlipidemia    Hypertension    Lichenification 10/27/2022   Obesity (BMI 30-39.9) 04/15/2020   Prediabetes    Hgb A1c 6.4% August 2021   Schizophrenia [facility])    Statin declined 04/16/2020   States he will not take a statin. Has taken them in past.    Statin declined 04/16/2020   States he will not take a statin. Has taken them in past.      Tobacco abuse    TOBACCO USE 07/14/2008   Qualifier: Diagnosis of   By: Tammie MD, Bruce       Weight gain 12/11/2022     Past Surgical History:  Procedure Laterality Date   ABDOMINAL AORTIC ANEURYSM REPAIR     ABDOMINAL AORTIC ENDOVASCULAR STENT GRAFT N/A 05/18/2014   Procedure: ABDOMINAL AORTIC ENDOVASCULAR STENT GRAFT;  Surgeon: Redell LITTIE Door, MD;  Location: [facility] OR;  Service: Vascular;  Laterality: N/A;   cataract Bilateral    08/2022 and 09/2022   KNEE SURGERY     bilateral    Family History  Problem Relation Age of Onset   Hypertension Brother    Hyperlipidemia Brother    Hypertension Father    Hyperlipidemia Father    Cancer Father      Social Connections: Socially Isolated (11/27/2023)   Social Connection and Isolation Panel    Frequency of Communication with Friends and Family: Never    Frequency of  Social Gatherings with Friends and Family: Never    Attends Religious Services: Never    Database Administrator or Organizations: No    Attends Engineer, Structural: Never    Marital Status: Divorced     Current Outpatient Medications  Medication Instructions   albuterol  (VENTOLIN  HFA) 108 (90 Base) MCG/ACT inhaler 2 puffs, Inhalation, Every 6 hours PRN   amitriptyline  (ELAVIL ) 50 mg, Oral, Daily at bedtime, For Itching and Sleep.   apixaban  (ELIQUIS ) 5 mg, Oral, 2 times daily   busPIRone  (BUSPAR ) 5 mg, Oral, 2 times daily   Dermatological Products,  Misc. [facility]) lotion apply to body daily for maintenance   ezetimibe  (ZETIA ) 10 mg, Oral, Daily   Fluticasone-Umeclidin-Vilant (TRELEGY ELLIPTA ) 200-62.5-25 MCG/ACT AEPB INHALE ONE PUFF BY MOUTH DAILY   furosemide  (LASIX ) 20 mg, Oral, Daily   gabapentin  (NEURONTIN ) 1,200 mg, Oral, Daily at bedtime, For Itching.   levothyroxine  (SYNTHROID ) 25 MCG tablet TAKE 1 TABLET EVERY DAY 30 MINUTES BEFORE ANY FOOD, WITHOUT ANY OTHER MEDICATION.   lisinopril  (ZESTRIL ) 20 mg, Oral, Daily   metoprolol  succinate (TOPROL -XL) 25 mg, Daily   terbinafine  (LAMISIL ) 250 mg, Oral, Daily, For fungal infection   triamcinolone  ointment (KENALOG ) 0.1 % Apply a thin layer to the thickened areas of skin twice a day for 7 days, then stop for 7 days, and repeat.   Vitamin D3 50 mcg     Physical Exam:   BP (!) 167/99 (BP Location: Right Arm, Patient Position: Sitting) Comment: did not take meds this morning  Pulse 62   SpO2 (!) 88%   Salient findings:  CN II-XII intact Bilateral EAC clear and TM intact with well pneumatized middle ear spaces No lesions of oral cavity/oropharynx No obviously palpable neck masses/lymphadenopathy/thyromegaly No respiratory distress or stridor  Seprately Identifiable Procedures:  Prior to initiating any procedures, risks/benefits/alternatives were explained to the patient and verbal consent obtained. None  Impression & Plans:  Steven Hayden is a 73 y.o. male with   1. Zenker diverticulum    Assessment and Plan Assessment & Plan Esophageal diverticulum Chronic esophageal diverticulum causing significant dysphagia, food impaction, and choking. Requires soft food diet. - Order esophagram to reassess diverticulum size and severity. - Discussed that I do not perform the repair he needs but can put in a referral to [facility] for surgical evaluation and management. He is in agreement.    See below regarding exact medications prescribed this encounter including dosages and  route: No orders of the defined types were placed in this encounter.   Thank you for allowing me the opportunity to care for your patient. Please do not hesitate to contact me should you have any other questions.  Sincerely, Hadassah Parody, MD Otolaryngologist (ENT), [facility] Phone: [phone] Fax: [phone]

## 2024-07-10 ENCOUNTER — Other Ambulatory Visit: Payer: Self-pay | Admitting: Physician Assistant

## 2024-07-28 ENCOUNTER — Encounter: Payer: Self-pay | Admitting: Nurse Practitioner

## 2024-08-02 ENCOUNTER — Other Ambulatory Visit: Payer: Self-pay | Admitting: Physician Assistant

## 2024-08-05 ENCOUNTER — Other Ambulatory Visit (HOSPITAL_COMMUNITY)

## 2024-08-07 ENCOUNTER — Encounter (INDEPENDENT_AMBULATORY_CARE_PROVIDER_SITE_OTHER): Payer: Self-pay

## 2024-08-12 ENCOUNTER — Ambulatory Visit: Payer: Self-pay

## 2024-08-12 NOTE — Telephone Encounter (Signed)
 FYI Only or Action Required?: FYI only for provider: appointment scheduled on 08/13/24.  Patient was last seen in primary care on 05/07/2024 by Early, Camie BRAVO, NP.  Called Nurse Triage reporting Pruritis, Leg Swelling, and Extremity Weakness.  Symptoms began hand weakness yesterday morning and has resolved, bilateral leg swelling x several weeks, itching all over x years.  Interventions attempted: Prescription medications: gabapentin , furosemide , amitriptyline ; elevation of extremities.  Symptoms are: leg swelling gradually worsening. Hand weakness resolved; SOB gradually worsening.  Triage Disposition: See Physician Within 24 Hours (overriding See HCP Within 4 Hours (Or PCP Triage))  Patient/caregiver understands and will follow disposition?: Yes               Copied from CRM #[id number]. Topic: Clinical - Red Word Triage >> Aug 12, 2024  9:02 AM Treva T wrote: [facility] that prompted transfer to Nurse Triage: Pt calling, reports his right leg has worsening swelling and pain, states his right leg is three times the size of his left leg.  Pt reports on yesterday he wasn't able to grip/hold anything in hand, and is worsening.   Also reports he is itching all over, with no relief.    Ph. [phone] Reason for Disposition  [1] Longstanding difficulty breathing (e.g., CHF, COPD, emphysema) AND [2] WORSE than normal  Answer Assessment - Initial Assessment Questions 1. SYMPTOM: What is the main symptom you are concerned about? (e.g., weakness, numbness)     Weakness in both hands, worse in the left hand unable to grip anything.  2. ONSET: When did this start? (e.g., minutes, hours, days; while sleeping)     Yesterday morning 0600.  3. LAST NORMAL: When was the last time you (the patient) were normal (no symptoms)?     Back to normal at this time, able to grip and hold with both hands.  4. PATTERN Does this come and go, or has it been constant since it started?  Is it  present now?     He states it lasted all day yesterday but is resolved now at this time.  5. CARDIAC SYMPTOMS: Have you had any of the following symptoms: chest pain, difficulty breathing, palpitations?     He states he has COPD so he has difficulty breathing at baseline but he feels it has been worsening. Patient speaking in full sentences and no labored breathing noted while talking. Patient states when exerting himself or walking he has to stop and hold onto furniture (go from chair to chair) due to feeling SOB.  6. NEUROLOGIC SYMPTOMS: Have you had any of the following symptoms: headache, dizziness, vision loss, double vision, changes in speech, unsteady on your feet?     No vision out of center of left eye but states that has been a year for onset since having cataract surgery, no new/recent visual changes. No changes in speech, dizziness or unsteady on your feet.  7. OTHER SYMPTOMS: Do you have any other symptoms?     Left and right leg swelling from foot to hip (and painful, he feels right side is worse); generalized severe itching x 6 years.  Protocols used: Neurologic Deficit-A-AH, Breathing Difficulty-A-AH

## 2024-08-12 NOTE — Progress Notes (Unsigned)
 No chief complaint on file.      Pt reports on yesterday he wasn't able to grip/hold anything in hand, and is worsening.    Also reports he is itching all over, with no relief.    Weakness in both hands, worse in the left hand unable to grip anything.  2. ONSET: When did this start? (e.g., minutes, hours, days; while sleeping) Yesterday morning 0600.  3. LAST NORMAL: When was the last time you (the patient) were normal (no symptoms)? Back to normal at this time, able to grip and hold with both hands.   Bilateral leg swelling from foot to hip, R>L. He told nurses yesterday  right leg has worsening swelling and pain, and is three times the size of his left leg.  He continues to have generalized severe itching x 6 years.   Similar history on visit with me last year, 08/2023 (itching and swelling). At that time he reported swelling for months, intermittent. Itching was all over his upper body, groin, legs.  Had NO itching in his calves/lower legs, where swollen.  He had US  RLE 09/04/23: Summary:  Right:  - No evidence of deep vein thrombosis seen in the right lower extremity,  from the common femoral through the popliteal veins.  - No evidence of superficial venous thrombosis in the right lower  extremity.  - The deep venous system is incompetent at the mid femoral vein.  - The great saphenous vein is competent.  - The small saphenous vein is competent.   Echo 07/2023--EF 55 to 60%.  Mild concentric LVH.  Moderate mitral annular calcification without stenosis or leakage.  Mild calcification of aortic valve without stenosis.  Moderate dilation of ascending aorta at 42 mm    PMH, PSH, SH reviewed   ROS:    PHYSICAL EXAM:  There were no vitals taken for this visit.  Wt Readings from Last 3 Encounters:  05/07/24 274 lb 9.6 oz (124.6 kg)  04/18/24 271 lb (122.9 kg)  03/06/24 273 lb (123.8 kg)       ASSESSMENT/PLAN:   Known venous insufficiency RLE Using  compression socks?? Remind pt to schedule f/u with cardiology--they only gave him 2 wks of meds last week, and he still doesn't have appt scheduled.    He is scheduled 12/12 for wellness visit with SaraBeth. No cardiology f/u scheduled.  They RF's zetia  and lasix  for #15 only last week, as he is overdue for f/u.

## 2024-08-13 ENCOUNTER — Ambulatory Visit: Admitting: Family Medicine

## 2024-08-13 ENCOUNTER — Encounter: Payer: Self-pay | Admitting: Family Medicine

## 2024-08-13 VITALS — BP 140/80 | HR 84 | Ht 71.0 in | Wt 277.0 lb

## 2024-08-13 DIAGNOSIS — Z5181 Encounter for therapeutic drug level monitoring: Secondary | ICD-10-CM

## 2024-08-13 DIAGNOSIS — I872 Venous insufficiency (chronic) (peripheral): Secondary | ICD-10-CM | POA: Diagnosis not present

## 2024-08-13 DIAGNOSIS — I10 Essential (primary) hypertension: Secondary | ICD-10-CM

## 2024-08-13 DIAGNOSIS — E038 Other specified hypothyroidism: Secondary | ICD-10-CM

## 2024-08-13 DIAGNOSIS — I480 Paroxysmal atrial fibrillation: Secondary | ICD-10-CM

## 2024-08-13 DIAGNOSIS — L299 Pruritus, unspecified: Secondary | ICD-10-CM | POA: Diagnosis not present

## 2024-08-13 DIAGNOSIS — J449 Chronic obstructive pulmonary disease, unspecified: Secondary | ICD-10-CM

## 2024-08-13 DIAGNOSIS — R29898 Other symptoms and signs involving the musculoskeletal system: Secondary | ICD-10-CM

## 2024-08-13 MED ORDER — ALBUTEROL SULFATE HFA 108 (90 BASE) MCG/ACT IN AERS: 2.0000 | INHALATION_SPRAY | Freq: Four times a day (QID) | RESPIRATORY_TRACT | 0 refills | Status: AC | PRN

## 2024-08-13 MED ORDER — TRELEGY ELLIPTA 200-62.5-25 MCG/ACT IN AEPB: INHALATION_SPRAY | RESPIRATORY_TRACT | 0 refills | Status: AC

## 2024-08-13 NOTE — Patient Instructions (Addendum)
 Please contact [facility].  SaraBeth sent a year's worth of prescriptions for your eliquis  in September.  You reported being out--they should be able to send you a refill, they have a prescription on file. Being off of the eliquis  puts you at increased risk for having a stroke, due to having intermittent atrial fibrillation. It is very important to get back on this medication.  Your cardiologist recently sent a refill for furosemide  (lasix )--this is a diuretic that helps with swelling. They only sent in #15, as you are past due to see the cardiologist. Please call the cardiology office to schedule a follow-up. They also sent in 2 weeks of ezetimide, which is your cholesterol medication.  If you haven't yet started taking the furosemide , you should take that once daily in the morning.  This is a diuretic that will help get some of the extra fluid off of your leg. Elevating your leg is laso very important. Once the swelling is a little better from the diuretic and the leg elevation, you should restart wearing the compression sock daily. You have known venous insufficiency in the veins in that right leg, causing your swelling, and the compression helps prevent the swelling.  You also should try and be mindful and limit the sodium in your diet.  Sodium contributes to high blood pressure and retaining fluid.  Try and get under 2000 mg of sodium daily.  Try and avoid fast foods, which tend to have a lot of salt.  I refilled your albuterol  and trelegy for just one refill.  You reported that you see [place] [facility] and are past due. The last prescription in the system from albuterol  was from 2022, and the last Trelegy was written in 01/2023. I do not want you out of these medications. You need to use the Trelegy daily, and follow up with your lung doctor. I sent these to your local pharmacy [facility]) so that you can take this without any missed medication.   Be sure to continue to  moisturize your legs (and everywhere!) at least 2-3 times daily. Use the triamcinolone  cream (prescription steroid) twice daily to the thick, scaly areas of your lower legs.  We are checking blood counts and your liver tests today, due to being on the antifungal medication now for over 3 months. I do not believe that you have an infection of the skin currently. If the skin starts weeping (draining, crusting) and if it becomes more red, painful or if you develop a fever, that indicates an infection, and will require follow-up for antibiotics.

## 2024-08-14 ENCOUNTER — Ambulatory Visit: Payer: Self-pay | Admitting: Family Medicine

## 2024-08-14 DIAGNOSIS — L299 Pruritus, unspecified: Secondary | ICD-10-CM

## 2024-08-14 LAB — CBC WITH DIFFERENTIAL/PLATELET
Basophils Absolute: 0 x10E3/uL (ref 0.0–0.2)
Basos: 0 %
EOS (ABSOLUTE): 0.1 x10E3/uL (ref 0.0–0.4)
Eos: 1 %
Hematocrit: 49.2 % (ref 37.5–51.0)
Hemoglobin: 16.4 g/dL (ref 13.0–17.7)
Immature Grans (Abs): 0 x10E3/uL (ref 0.0–0.1)
Immature Granulocytes: 0 %
Lymphocytes Absolute: 3.4 x10E3/uL — ABNORMAL HIGH (ref 0.7–3.1)
Lymphs: 30 %
MCH: 32.3 pg (ref 26.6–33.0)
MCHC: 33.3 g/dL (ref 31.5–35.7)
MCV: 97 fL (ref 79–97)
Monocytes Absolute: 0.8 x10E3/uL (ref 0.1–0.9)
Monocytes: 7 %
Neutrophils Absolute: 7 x10E3/uL (ref 1.4–7.0)
Neutrophils: 62 %
Platelets: 153 x10E3/uL (ref 150–450)
RBC: 5.08 x10E6/uL (ref 4.14–5.80)
RDW: 13.4 % (ref 11.6–15.4)
WBC: 11.3 x10E3/uL — ABNORMAL HIGH (ref 3.4–10.8)

## 2024-08-14 LAB — HEPATIC FUNCTION PANEL
ALT: 20 IU/L (ref 0–44)
AST: 25 IU/L (ref 0–40)
Albumin: 4.1 g/dL (ref 3.8–4.8)
Alkaline Phosphatase: 80 IU/L (ref 47–123)
Bilirubin Total: 0.3 mg/dL (ref 0.0–1.2)
Bilirubin, Direct: 0.14 mg/dL (ref 0.00–0.40)
Total Protein: 7 g/dL (ref 6.0–8.5)

## 2024-08-15 ENCOUNTER — Ambulatory Visit: Payer: Self-pay | Admitting: Nurse Practitioner

## 2024-08-15 ENCOUNTER — Inpatient Hospital Stay (HOSPITAL_COMMUNITY): Admission: RE | Admit: 2024-08-15

## 2024-08-19 LAB — SPECIMEN STATUS REPORT

## 2024-08-19 LAB — TSH: TSH: 1.35 u[IU]/mL (ref 0.450–4.500)

## 2024-08-22 ENCOUNTER — Encounter: Payer: Self-pay | Admitting: Nurse Practitioner

## 2024-09-10 MED ORDER — CHOLESTYRAMINE LIGHT 4 G PO PACK: 4.0000 g | PACK | Freq: Two times a day (BID) | ORAL | 3 refills | Status: AC

## 2024-09-17 ENCOUNTER — Other Ambulatory Visit: Payer: Self-pay | Admitting: Nurse Practitioner

## 2024-09-17 DIAGNOSIS — L508 Other urticaria: Secondary | ICD-10-CM

## 2024-09-18 NOTE — Telephone Encounter (Signed)
 Last appt. 05/07/24

## 2024-09-22 ENCOUNTER — Other Ambulatory Visit: Payer: Self-pay

## 2024-09-22 ENCOUNTER — Telehealth: Payer: Self-pay | Admitting: Nurse Practitioner

## 2024-09-22 DIAGNOSIS — I10 Essential (primary) hypertension: Secondary | ICD-10-CM

## 2024-09-22 MED ORDER — LISINOPRIL 20 MG PO TABS: 20.0000 mg | ORAL_TABLET | Freq: Every day | ORAL | 0 refills | Status: AC

## 2024-09-22 NOTE — Telephone Encounter (Signed)
 Fax from [facility]  Lisinopril  20 mg

## 2024-09-24 ENCOUNTER — Other Ambulatory Visit: Payer: Self-pay

## 2024-09-24 ENCOUNTER — Telehealth: Payer: Self-pay | Admitting: Nurse Practitioner

## 2024-09-24 DIAGNOSIS — E038 Other specified hypothyroidism: Secondary | ICD-10-CM

## 2024-09-24 MED ORDER — LEVOTHYROXINE SODIUM 25 MCG PO TABS: 25.0000 ug | ORAL_TABLET | Freq: Every day | ORAL | 1 refills | Status: AC

## 2024-09-24 NOTE — Telephone Encounter (Signed)
 Fax from [facility] pharmacy  Levothyroxine   25 mcg  #90

## 2024-10-09 NOTE — Progress Notes (Signed)
 "  Acute Physical Therapy Treatment  PERTINENT MEDICAL INFORMATION  Precautions: Precautions Other Therapy Precautions: Fall risk Comment: Keep O2 Sat Above: 92%  Medical Diagnosis/Course: PT Diagnosis/Course Pertinent Medical Course: Patient is 74 year old male presented to [facility] 1/28 due to AMS. Patient admitted due to acute metabolic encephalopathy secondary to hypercapnia requiring ICU on BiPAP  ASSESSMENT SUMMARY   Patient agreeable to therapy session. Patient able to tolerate participation with the interventions documented below. Patient making good progress towards goals. Will communicate with supervising PT. Will continue to follow patient per plan of care.  Problem list: Impairments/Limitations: Activity Tolerance Deficits, Ambulation Deficits, Balance Deficits, Bed mobility deficits, Mobility deficits, Muscle weakness, Transfer deficits   PT Recommendation: PT Recommendation:  (5x or more x/week) PT Equipment Recommended: Defer to next level of care  *Actual disposition location dependent on change in patient functional status, patient/caregiver preferences, medical status, bed availability, and insurance approval.   Maximal anticipated caregiver assistance required at DC:  Intermittent supervision/assistance  Caregiver assistance required for:  assistance with transportation   Home Living Environment: Type of Home Single-wide trailer  Home Layout One level, Ramped entry  Tax Inspector - number 4  Exterior Stairs - rails Bilateral (narrow)  Interior Stairs - number    Soil Scientist - Heritage Manager / Tub Equities Trader bars in shower, Regular toilet height  Doctor, General Practice bars, Higher Education Careers Adviser, Estate Manager/land Agent Currently Using    Additional Comments Patient was independent in all functional tasks as to BADLs, IADLs, was independent with medication management but daughter reported that would  need assistance with medication at this time, uses rollator at baseline, uses [facility] community distances, 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  Patient Responsibilities    Requires Assist With     SUBJECTIVE  Subjective: I want a hot breakfast before I go home.  Appearance:   Pt position on arrival: In supine  Informed Consent: Patient alert, cooperative  Assessed in the Presence of: Alone  Consent for therapy provided by: Nurse  Communication:  Interdisciplinary Team Communication Communication: Patient    Lines and Leads: none  OBJECTIVE   Pain: Pain Assessment Pain Assessment: No/denies pain  Cognition: Cognition Orientation Level: Oriented x4 Not Oriented to: Time Safety Awareness: Impaired Insight: Decreased awareness of deficits  Balance: Sitting - Static: Independent Sitting - Dynamic: Independent Standing - Static: Distant supervision Standing - Dynamic: Distant supervision Balance Additional Comments: Patient performed static standing x3 for 3-4 minutes per trial. No loss of balance noted.  Therapy activity vitals: stable  INTERVENTIONS  Functional Mobility:  Transfers Assistive Device: None Sit to Stand: Distant supervision Stand to Sit: Distant supervision Transfers  Additional Comments: sit to stand from [facility] x2 Mobility Gait Distance (ft): 20 ft Gait Additional Comments: min cues for safety and for breathing techniques. Patient furniture walking in room. Gait Assistance: Close supervision Assistive Device: None  Skilled PT Treatment/Education Provided  Role and POC of PT, Deep breathing, and Fall Prevention (Please see Functional Mobility above for educational details pertaining to bed mobility, transfers and/or gait.)   Education:  Education provided  to: Patient Response to Education: Patient verbalizes understanding and Patient demonstrates understanding  Condition After Therapy:  Back to bed, Nursing notified of condition, All needs  within reach  PERFORMANCE OUTCOME MEASURES  AM-PAC - Basic Mobility:    Flowsheet Row Most Recent Value  AM-PAC 6-Clicks - Basic Mobility  Turning from you back to your side while in a flat bed without using bed rails? None  Moving from lying on your back to sitting on the side of a flat bed without using bed rails? None  Moving to and from a bed to a chair (including a wheelchair)? None  Standing up from a chair using your arms (e.g, wheelchair, or bedside chair)? None  To walk in a hospital room? None  Climbing 3-5 steps with a railing? A little  AM-PAC Total Score 23   [employer] AM-PAC 6 Clicks Basic Mobility score of 23 indicating 15.86% of functional impairment.  PT PLAN OF CARE  Rehab Potential:  Rehab Potential: Fair Complexity/Co-morbidities that Impact POC: Obesity, Lifestyle, Pulmonary compromise, Cardiac compromise Impairments/Limitations: Activity Tolerance Deficits, Ambulation Deficits, Balance Deficits, Bed mobility deficits, Mobility deficits, Muscle weakness, Transfer deficits  Plan:  Treatment Plan/Goals Established with Patient/Caregiver: Yes Planned Treatment/Interventions: Patient/Caregiver education, Therapeutic activity, Therapeutic exercise, Gait/mobility training, Neuromuscular re-education PT Frequency: 3x week PT Duration: For 3 weeks  PT Goals:  Encounter Problems     Encounter Problems (Active)     Physical Therapy     Patient Specific Goal 1     Start:  10/02/24    Expected End:  10/23/24      Patient will be mod I with all bed mobility.      Patient Specific Goal 2     Start:  10/02/24    Expected End:  10/23/24       Patient will go from seated scoot, sit to stand, stand to sit, bed to chair, and chair to bed with modified independent.       Patient Specific Goal 3     Start:  10/02/24    Expected End:  10/23/24       Patient will ambulate 150 feet with RW and modified independent.      Patient Specific Goal 4     Start:  10/02/24    Expected End:  10/23/24       Patient will increase balance to static/dynamic standing to good/Good.             TREATMENT TIME   Time In: 0947 Time Out: 1010 Time in Timed codes: 11/13/2024 Total Treatment Time: 2024-11-13     Treatment/Procedures Time Entry Therapeutic Activity minutes: 23     Charges           10/09/2024   Code Description Service Provider Modifiers Quantity  [id number] Hc Pt Therapeut Actvity Direct Pt Contact Each 15 Min Candace G Riedl, PTA GP, CQ 2        Time of Service Note Type Status  None          Acute Physical Therapy Treatment  "

## 2024-10-09 NOTE — Discharge Summary (Signed)
 "    General Medicine  Discharge Summary   Name: Steven Hayden Age: 74 yrs  MRN: [id number] DOB: [birth year]  Admit Date: 09/30/2024 Admitting Physician: Zoaib Safdar Rasool, DO  Discharge Date: 10/09/2024 Discharge Physician: Ruel Shove, MD   Chief Complaint  Patient presents with   Irregular Heart Beat   Altered Mental Status    Discharge Diagnoses:   Principal Problem (Resolved):   Acute metabolic encephalopathy Active Problems:   Paroxysmal atrial fibrillation    (CMD)   Mixed hyperlipidemia   Coronary artery disease involving native coronary artery   Morbid obesity (CMD)   Chronic pruritus   Bipolar disorder (CMD)   Attention deficit hyperactivity disorder   COPD (chronic obstructive pulmonary disease) (CMD)   Alcohol  use   Acute on chronic hypoxic and hypercarbic respiratory failure   Generalized muscle weakness   Zenker's diverticulum   COPD, severe    (CMD)   Aneurysm of ascending aorta without rupture Resolved Problems:   At risk for polypharmacy   Acute metabolic encephalopathy      Hospital Course:   For full details, please see H&P, progress notes, consult notes and ancillary notes.  Briefly 73 years gentleman with medical history significant for COPD, Zenker's diverticulum, chronic hypoxic respiratory failure with home oxygen need of 2-1/2 L, coronary artery disease, GERD who presented with altered mental status due to CO2 narcosis from COPD flare Assessment & Plan Acute metabolic encephalopathy Acute on chronic hypoxic and hypercarbic respiratory failure COPD, severe    (CMD) Acute metabolic encephalopathy # Tobacco abuse # Severe COPD with acute exacerbation # Pulmonary nodules # Suspected OSA # Obesity:-BMI 37 --Patient admitted due to acute metabolic encephalopathy secondary to hypercapnia requiring ICU on BiPAP.  His uncompensated resp acidosis is now resolved with BiPAP and mentation is clear Plan Patient rapidly improving and is on 2-1/2 L  of oxygen which is his baseline home oxygen regimen. Today finish course of antibiotics continue with Trelegy nebulizer prescription and was given with as needed DuoNebs upon discharge. Patient ambulated on his own with walker and cleared to be discharged home. Patient will follow-up with pulmonary rehab, outpatient sleep study and pulmonology. Patient to have monitoring of lung nodules with pulmonology Patient is on tapering dose of prednisone  Patient symptoms improved and was discharged home  Zenker's diverticulum -- Patient has a known trigger diverticulum and tells me they have scheduled outpatient barium swallow. Bedside nurse noticed that he appeared to be having issues with aspiration when eating.  Aspiration could be contributing to his overall picture. Diverticulum was not well-visualized on current esophagram Plan -Speech therapy recommending soft and bite-size diet with thin liquids -Continue aspiration precautions  Paroxysmal atrial fibrillation    (CMD) Coronary artery disease involving native coronary artery # Nonsustained V. Tach - Continue metoprolol  25 daily, continue Eliquis  5 mg BID    # Acute on chronic HFpEF --echo on 07/18/2023 revealed EF 55 to 60%. BNP is normal.  CXR revealed pulmonary vascular congestion.  Chronically takes 80 of Lasix  resume upon discharge   # Essential hypertension Upon discharge patient is on Lasix  20 and metoprolol  25 daily.  Next Chronic pruritus Patient with known history of atopic dermatitis following with dermatology and has been started on immunotherapy which patient has not filled yet.  Follows up at [facility]. Hydroxyzine  as needed  Alcohol  use No signs of withdrawal.   # Schizophrenia # Bipolar disorder Patient said he intermittently hears voices but is helping him to calm down.  Does not want any treatment.  Discussed with patient and daughter over the phone   Acute kidney injury resolved with IV fluids.  Above plan of care  discussed with patient and he verbalized understanding     Readmission risk score (calculated)  Predictive Model Details        18.3% (Medium)  Factor Value   Calculated 10/09/2024 08:06 13% Braden score 17   Readmission Risk Score v2 Model 7% Number of hospitalizations in last year 0    6% Latest hemoglobin in last 72 hrs 16.3 g/dL    6% Latest BUN in last 72 hrs 25 mg/dL    5% Latest RDW in last 72 hrs 14.5 %      The patient's chronic medical conditions were treated accordingly per the patient's home medication regimen except as noted in the plan above and in the medication list below.    Discharge Condition:   Disposition: Patient discharged to Home or Self Care    Physical Exam at Discharge   Patient was seen and examined on the day of discharge and his clinical condition was stable for discharge     Discharge Medications:      Medication List     START taking these medications    gabapentin  600 mg tablet Commonly known as: NEURONTIN  Take 0.5 tablets (300 mg total) by mouth nightly.   hydrOXYzine  50 mg tablet Commonly known as: ATARAX  Take 1 tablet (50 mg total) by mouth every 8 (eight) hours as needed for itching or anxiety.   ipratropium-albuterol  0.5-2.5 mg/3 mL nebulizer solution Commonly known as: DUO-NEB Take 3 mL by nebulization every 6 (six) hours as needed for wheezing or shortness of breath.   nebulizers Misc Use as directed.   predniSONE  10 mg tablet Commonly known as: DELTASONE  Take 2 tablets (20 mg total) by mouth daily for 2 days, THEN 1 tablet (10 mg total) daily for 3 days. Start taking on: October 10, 2024       CONTINUE taking these medications    albuterol  HFA 90 mcg/actuation inhaler Commonly known as: PROVENTIL  HFA;VENTOLIN  HFA;PROAIR  HFA Inhale 2 puffs every 6 (six) hours as needed for wheezing or shortness of breath.   apixaban  5 mg Tab Commonly known as: ELIQUIS  Take 1 tablet by mouth 2 (two) times a day.   buspirone   5 mg tablet Commonly known as: BUSPAR  Take 1 tablet by mouth 2 (two) times a day.   cholecalciferol (vitamin D3) 50 mcg (2,000 unit) Chew Chew 50 mcg daily.   ezetimibe  10 mg tablet Commonly known as: ZETIA  Take 10 mg by mouth daily.   furosemide  20 mg tablet Commonly known as: LASIX  Take 20 mg by mouth daily.   levothyroxine  25 mcg tablet Commonly known as: SYNTHROID  Take 25 mcg by mouth.   metoprolol  succinate 25 mg 24 hr tablet Commonly known as: TOPROL  XL Take 25 mg by mouth daily.   nemolizumab-ilto 30 mg pen injector Commonly known as: NEMLUVIO Inject 60mg  (2 pens) subcutaneously at day 0 then 30mg  (1 pen) every 4 weeks   omeprazole  40 mg DR capsule Commonly known as: PriLOSEC Take 40 mg by mouth daily before breakfast.   Trelegy Ellipta  200-62.5-25 mcg inhaler Generic drug: fluticasone-umeclidinium-vilanterol Inhale 1 puff daily.       STOP taking these medications    amitriptyline  50 mg tablet Commonly known as: ELAVIL    betamethasone (augmented) 0.05 % lotion Commonly known as: DIPROLENE AF   Cholestyramine  Light 4 gram packet Generic drug:  cholestyramine  light   isoniazid  300 mg tablet Commonly known as: NYDRAZID    lisinopril  20 mg tablet Commonly known as: PRINIVIL    triamcinolone  acetonide 0.1 % ointment Commonly known as: KENALOG          Where to Get Your Medications     These medications were sent to [facility] [id number] - [place], [place] - [address] - PHONE: [phone] - FAX: [phone]  [address], [place] [address]    Phone: [phone]  gabapentin  600 mg tablet hydrOXYzine  50 mg tablet ipratropium-albuterol  0.5-2.5 mg/3 mL nebulizer solution predniSONE  10 mg tablet Trelegy Ellipta  200-62.5-25 mcg inhaler    You can get these medications from any pharmacy   Bring a paper prescription for each of these medications nebulizers Misc       Surgeries/Procedures:      Consults:   IP  CONSULT TO HOSPITALIST IP CONSULT TO INTENSIVIST IP CONSULT TO PULMONOLOGY   Follow-up Appointments:     Future Appointments  Date Time Provider Department Center  10/14/2024 11:00 AM [facility] GI3 [facility] XR RTR [facility]  10/14/2024  2:20 PM Lyndsay Anette Cohens, DO [facility] MPM ENT [facility]     Appointments which have been scheduled for you    Oct 14, 2024 11:00 AM [place] Esophagus Barium Swallow with [facility] GI3 [facility] - RADIOLOGY XR RTR [facility]) [facility] [place] [address] [phone]   Please report to [facility]) Pediatric Radiology Department for your appoinment. Please make every effort to arrive 15 minutes prior to your scheduled appointment time. If you are more than 15 minutes late for your scheduled appointment, you may be asked to reschedule.  ADULT PATIENT PREP: Nothing to eat or drink after midnight prior to the exam.  PEDIATRIC PATIENT Do not eat or drink for 4 hours prior to exam. One feeding interval allowed for infants. Bring child's medication list, insurance card, and photo ID    Oct 14, 2024 2:20 PM Office Visit with Mirna Anette Cohens, DO [facility] - MPM ENT [facility] [address] - [place]) [address] [place] [address] [phone]          Time spent on discharge: 35 min  this includes but not limited to 50% face to face time, time reviewing vitals, medications, lab findings, imaging, discussing with Pharmacy about current medications and reconciliation and discussing the management plan with the nursing staff and, case manager/ SW, patient at the bedside.  Electronically signed by:  Kiran Regmi MD,FACP  "

## 2024-10-09 NOTE — Progress Notes (Signed)
 Case Management Discharge Note  Spoke with pt at b/s. He is aware and agreeable for dc home today. Christy with [facility] accepted for HHRN/AIDE/SW/PT (SAFETY AND MOTION EVAL). Notified of dc today.  Pt has home oxygen- discharged on 2.5L.no DME needed.  Spoke with pt's daughter Abundio advised of above information.        CSN: [id number] DOB: 1951-08-02 Service: General Medicine Location: 618/01  Patient Class: Inpatient  DC Disposition: : Home Health Care  Discharge DC Disposition: : Home Health Care Homecare Referral: (PT) Physical Therapy, (SN) Skilled Nursing, Aide, (SW) Social Work Homecare/Hospice Agency(s) chosen: [facility]  Discharge Referrals Patient Preference: Chosen geographical local area/county shared with patient/family: Yes Date chosen geographical local area/county list shared with patient/family: 10/09/24 Patient Preference for Post-Acute Provider Form completed: Yes Case closed, patient/family agree with disposition plan: Yes           Sherrilyn CHRISTELLA Maser, MSW

## 2024-11-06 ENCOUNTER — Ambulatory Visit: Admitting: Nurse Practitioner

## 2024-12-09 ENCOUNTER — Ambulatory Visit: Payer: Self-pay
